# Patient Record
Sex: Female | Born: 1945 | Race: White | Hispanic: No | Smoking: Former smoker
Health system: Southern US, Community
[De-identification: ages and names within clinical notes are randomized; demographics above are authoritative.]

## PROBLEM LIST (undated history)

## (undated) DIAGNOSIS — R0602 Shortness of breath: Secondary | ICD-10-CM

## (undated) DIAGNOSIS — M5126 Other intervertebral disc displacement, lumbar region: Secondary | ICD-10-CM

## (undated) DIAGNOSIS — M4800 Spinal stenosis, site unspecified: Secondary | ICD-10-CM

## (undated) DIAGNOSIS — K219 Gastro-esophageal reflux disease without esophagitis: Secondary | ICD-10-CM

## (undated) DIAGNOSIS — R195 Other fecal abnormalities: Secondary | ICD-10-CM

## (undated) DIAGNOSIS — G709 Myoneural disorder, unspecified: Secondary | ICD-10-CM

## (undated) DIAGNOSIS — I499 Cardiac arrhythmia, unspecified: Secondary | ICD-10-CM

## (undated) DIAGNOSIS — F329 Major depressive disorder, single episode, unspecified: Secondary | ICD-10-CM

## (undated) DIAGNOSIS — I10 Essential (primary) hypertension: Secondary | ICD-10-CM

## (undated) DIAGNOSIS — C801 Malignant (primary) neoplasm, unspecified: Secondary | ICD-10-CM

## (undated) DIAGNOSIS — F32A Depression, unspecified: Secondary | ICD-10-CM

## (undated) DIAGNOSIS — IMO0001 Reserved for inherently not codable concepts without codable children: Secondary | ICD-10-CM

## (undated) DIAGNOSIS — M797 Fibromyalgia: Secondary | ICD-10-CM

## (undated) DIAGNOSIS — F419 Anxiety disorder, unspecified: Secondary | ICD-10-CM

## (undated) HISTORY — PX: EYE SURGERY: SHX253

## (undated) HISTORY — PX: CHOLECYSTECTOMY: SHX55

## (undated) HISTORY — PX: TONSILLECTOMY: SUR1361

## (undated) HISTORY — DX: Gastro-esophageal reflux disease without esophagitis: K21.9

## (undated) HISTORY — PX: APPENDECTOMY: SHX54

## (undated) HISTORY — PX: ABDOMINAL HYSTERECTOMY: SHX81

## (undated) HISTORY — DX: Reserved for inherently not codable concepts without codable children: IMO0001

## (undated) HISTORY — PX: CARPAL TUNNEL RELEASE: SHX101

## (undated) HISTORY — DX: Other fecal abnormalities: R19.5

---

## 2003-10-25 ENCOUNTER — Ambulatory Visit (HOSPITAL_BASED_OUTPATIENT_CLINIC_OR_DEPARTMENT_OTHER): Admission: RE | Admit: 2003-10-25 | Discharge: 2003-10-25 | Payer: Self-pay | Admitting: Orthopedic Surgery

## 2003-10-25 ENCOUNTER — Ambulatory Visit (HOSPITAL_COMMUNITY): Admission: RE | Admit: 2003-10-25 | Discharge: 2003-10-25 | Payer: Self-pay | Admitting: Orthopedic Surgery

## 2004-04-07 ENCOUNTER — Ambulatory Visit (HOSPITAL_COMMUNITY): Admission: RE | Admit: 2004-04-07 | Discharge: 2004-04-07 | Payer: Self-pay | Admitting: Orthopedic Surgery

## 2004-04-07 ENCOUNTER — Ambulatory Visit (HOSPITAL_BASED_OUTPATIENT_CLINIC_OR_DEPARTMENT_OTHER): Admission: RE | Admit: 2004-04-07 | Discharge: 2004-04-07 | Payer: Self-pay | Admitting: Orthopedic Surgery

## 2010-07-13 LAB — HEPATIC FUNCTION PANEL: Bilirubin, Total: 0.2 mg/dL

## 2010-07-13 LAB — BASIC METABOLIC PANEL: Potassium: 4 mmol/L (ref 3.4–5.3)

## 2010-09-09 ENCOUNTER — Ambulatory Visit (INDEPENDENT_AMBULATORY_CARE_PROVIDER_SITE_OTHER): Payer: BC Managed Care – PPO | Admitting: Internal Medicine

## 2010-09-09 DIAGNOSIS — K921 Melena: Secondary | ICD-10-CM

## 2010-09-09 DIAGNOSIS — Z7982 Long term (current) use of aspirin: Secondary | ICD-10-CM

## 2010-09-09 DIAGNOSIS — D649 Anemia, unspecified: Secondary | ICD-10-CM

## 2010-09-09 LAB — CBC AND DIFFERENTIAL
HCT: 81 % — AB (ref 36–46)
Hemoglobin: 10.9 g/dL — AB (ref 12.0–16.0)

## 2010-10-05 NOTE — Consult Note (Signed)
NAME:  Brianna Ray, Brianna Ray NO.:  [id number]  MEDICAL RECORD NO.:  [id number]  LOCATION:                                 FACILITY:  PHYSICIAN:  Hildred Laser, M.D.    DATE OF BIRTH:  16-May-1945  DATE OF CONSULTATION: 09/09/2010 DATE OF DISCHARGE:                                CONSULTATION   REASON FOR CONSULTATION:  Heme-positive stools.  HISTORY OF PRESENT ILLNESS:  Brianna Ray is a 65 year old white female referred to our office by Dr. Nadara Mustard for heme-positive stools.  It was also noted on July 13, 2010, her hemoglobin was slightly low at 10.8, hematocrit was 33.7 and her MCV was 76, platelets 223 which were normal. She does tell me that her stool cards, she could not tell me how many were positive.  She tells me that her hemoglobin last year was around 11.  She says her appetite has been good.  There has been no weight loss.  She does tell me occasionally she will have crampy lower abdominal pain and then immediately have to go to the bathroom.  She will have 2-3 stools a day or more during these episodes which are very small.  The crampy abdominal pain occurs a couple of times a week and then she may go over a week without any problems.  She denies any melena or bright red rectal bleeding.  She usually has 1 bowel movement a day which she describes as chunky and in pieces.  She does tell me this summer her stools are very thin.  She does have formed stools about 3 times a week.  She occasionally has acid reflux if she overeats.  She denies any epigastric pain.  There has been no nausea or vomiting.  No fever.  No dysphagia.  No constipation.  Her last colonoscopy was either 2000 or 2003 by Dr. Lindalou Hose and she reports that it was normal.  She could not remember why she underwent the colonoscopy.   She is allergic to [facility].  MEDICATIONS: 1. Lantus 60 units twice a day. 2. NovoLog 4/15 units sliding scale. 3. Metformin 1000 mg twice a day. 4. Diltiazem 60 mg  2 three times a day. 5. Enalapril 20 mg a day. 6. Lasix 40 mg a day. 7. Metoprolol 50 mg a day. 8. Glyburide 5 mg 2 b.i.d. 9. Hydrocodone/APAP 10/500 one at night. 10.Aspirin 325 mg a day.  SURGERY:  She had a total hysterectomy for fibroids at age 47 in 24. Appendectomy age 6.  Tonsillectomy age 15.  She had a basal cell carcinoma removed from her nose 2 years ago by the Mohs procedure.  MEDICAL HISTORY:  She has had hypertension since age 59.  She has been a diabetic type 2 since 1993.  She has high cholesterol off and on.  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  Her mother is deceased from breast cancer with mets.  Her father is deceased from lung cancer.  One sister with diabetes and seizures.  No brothers.  SOCIAL HISTORY:  She is divorced.  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  She quit smoking in 2005 after smoking 40 years.  She was smoking 1 pack a day and no EtOH.  OBJECTIVE:  VITAL SIGNS:  Her weight is 314, her height is 5 feet 10 inches, her temperature is 98.7, her blood pressure is 172/66, her pulse is 80. HEENT:  She has natural teeth.  Her oral mucosa is moist and no lesions. Her conjunctivae are pink.  Her sclerae are anicteric. NECK:  Her thyroid is normal.  There is no cervical lymphadenopathy. LUNGS:  Clear. HEART:  Regular rate and rhythm. ABDOMEN:  Soft.  Bowel sounds are positive.  She does have a soft firm area to the right upper quadrant.  There is no abdominal pain. EXTREMITIES:  She has 4+ edema to her lower extremities and her stools are brown guaiac negative.  ASSESSMENT:  Brianna Ray is a 65 year old female presenting today with anemia and guaiac-positive stools.  A colonic neoplasm needs to be ruled out. Her last colonoscopy was either in 2000 or 2003 by Dr. Lindalou Hose which she reports is normal.  RECOMMENDATIONS:  We will schedule a colonoscopy in near future.  The risk and benefits were reviewed with the patient and she is agreeable. I will also get iron studies and a CBC on her today.  Thank you for allowing [place] to participate in her care.    ______________________________ Deberah Castle, NP   ______________________________ Hildred Laser, M.D.    TS/MEDQ  D:  09/09/2010  T:  09/10/2010  Job:  [id number]  cc:   Rory Percy, MD Fax: [phone]  Electronically Signed by Deberah Castle PA on 09/14/2010 12:12:17 PM Electronically Signed by Hildred Laser M.D. on 10/05/2010 12:15:31 AM

## 2010-10-21 ENCOUNTER — Encounter (HOSPITAL_COMMUNITY)
Admission: RE | Disposition: A | Discharge status: Home / Self Care | Payer: Self-pay | Source: Ambulatory Visit | Attending: Internal Medicine

## 2010-10-21 ENCOUNTER — Ambulatory Visit (HOSPITAL_COMMUNITY)
Admission: RE | Admit: 2010-10-21 | Discharge: 2010-10-21 | Disposition: A | Discharge status: Home / Self Care | Payer: BC Managed Care – PPO | Source: Ambulatory Visit | Attending: Internal Medicine | Admitting: Internal Medicine

## 2010-10-21 ENCOUNTER — Other Ambulatory Visit (INDEPENDENT_AMBULATORY_CARE_PROVIDER_SITE_OTHER): Payer: Self-pay | Admitting: Internal Medicine

## 2010-10-21 ENCOUNTER — Encounter (INDEPENDENT_AMBULATORY_CARE_PROVIDER_SITE_OTHER): Payer: BC Managed Care – PPO | Admitting: Internal Medicine

## 2010-10-21 ENCOUNTER — Encounter (HOSPITAL_COMMUNITY): Payer: Self-pay | Admitting: *Deleted

## 2010-10-21 DIAGNOSIS — D509 Iron deficiency anemia, unspecified: Secondary | ICD-10-CM | POA: Insufficient documentation

## 2010-10-21 DIAGNOSIS — K921 Melena: Secondary | ICD-10-CM

## 2010-10-21 DIAGNOSIS — K5730 Diverticulosis of large intestine without perforation or abscess without bleeding: Secondary | ICD-10-CM | POA: Insufficient documentation

## 2010-10-21 DIAGNOSIS — Z79899 Other long term (current) drug therapy: Secondary | ICD-10-CM | POA: Insufficient documentation

## 2010-10-21 DIAGNOSIS — K449 Diaphragmatic hernia without obstruction or gangrene: Secondary | ICD-10-CM | POA: Insufficient documentation

## 2010-10-21 DIAGNOSIS — E119 Type 2 diabetes mellitus without complications: Secondary | ICD-10-CM | POA: Insufficient documentation

## 2010-10-21 DIAGNOSIS — K2940 Chronic atrophic gastritis without bleeding: Secondary | ICD-10-CM | POA: Insufficient documentation

## 2010-10-21 DIAGNOSIS — Z7982 Long term (current) use of aspirin: Secondary | ICD-10-CM | POA: Insufficient documentation

## 2010-10-21 DIAGNOSIS — R109 Unspecified abdominal pain: Secondary | ICD-10-CM | POA: Insufficient documentation

## 2010-10-21 DIAGNOSIS — Z01812 Encounter for preprocedural laboratory examination: Secondary | ICD-10-CM | POA: Insufficient documentation

## 2010-10-21 DIAGNOSIS — D126 Benign neoplasm of colon, unspecified: Secondary | ICD-10-CM | POA: Insufficient documentation

## 2010-10-21 DIAGNOSIS — K2961 Other gastritis with bleeding: Secondary | ICD-10-CM

## 2010-10-21 DIAGNOSIS — K644 Residual hemorrhoidal skin tags: Secondary | ICD-10-CM

## 2010-10-21 DIAGNOSIS — R197 Diarrhea, unspecified: Secondary | ICD-10-CM | POA: Insufficient documentation

## 2010-10-21 DIAGNOSIS — I10 Essential (primary) hypertension: Secondary | ICD-10-CM | POA: Insufficient documentation

## 2010-10-21 HISTORY — DX: Myoneural disorder, unspecified: G70.9

## 2010-10-21 HISTORY — PX: COLONOSCOPY: SHX5424

## 2010-10-21 HISTORY — DX: Cardiac arrhythmia, unspecified: I49.9

## 2010-10-21 HISTORY — DX: Major depressive disorder, single episode, unspecified: F32.9

## 2010-10-21 HISTORY — DX: Depression, unspecified: F32.A

## 2010-10-21 HISTORY — PX: ESOPHAGOGASTRODUODENOSCOPY: SHX5428

## 2010-10-21 HISTORY — DX: Malignant (primary) neoplasm, unspecified: C80.1

## 2010-10-21 HISTORY — DX: Anxiety disorder, unspecified: F41.9

## 2010-10-21 HISTORY — PX: COLONOSCOPY: SHX174

## 2010-10-21 HISTORY — DX: Fibromyalgia: M79.7

## 2010-10-21 HISTORY — DX: Essential (primary) hypertension: I10

## 2010-10-21 HISTORY — DX: Shortness of breath: R06.02

## 2010-10-21 LAB — GLUCOSE, CAPILLARY: Glucose-Capillary: 182 mg/dL — ABNORMAL HIGH (ref 70–99)

## 2010-10-21 SURGERY — COLONOSCOPY
Anesthesia: Moderate Sedation

## 2010-10-21 MED ORDER — MEPERIDINE HCL 50 MG/ML IJ SOLN
INTRAMUSCULAR | Status: AC
  Filled 2010-10-21: qty 1

## 2010-10-21 MED ORDER — MIDAZOLAM HCL 5 MG/5ML IJ SOLN
INTRAMUSCULAR | Status: AC
  Filled 2010-10-21: qty 10

## 2010-10-21 MED ORDER — PANTOPRAZOLE SODIUM 40 MG PO TBEC: DELAYED_RELEASE_TABLET | ORAL | Status: AC

## 2010-10-21 MED ORDER — BUTAMBEN-TETRACAINE-BENZOCAINE 2-2-14 % EX AERO
INHALATION_SPRAY | CUTANEOUS | Status: DC | PRN
  Administered 2010-10-21: 2 via TOPICAL

## 2010-10-21 MED ORDER — SODIUM CHLORIDE 0.45 % IV SOLN
Freq: Once | INTRAVENOUS | Status: AC
  Administered 2010-10-21: 12:00:00 via INTRAVENOUS

## 2010-10-21 MED ORDER — MEPERIDINE HCL 50 MG/ML IJ SOLN
INTRAMUSCULAR | Status: DC | PRN
  Administered 2010-10-21: 50 mg via INTRAVENOUS
  Administered 2010-10-21 (×2): 25 mg via INTRAVENOUS

## 2010-10-21 MED ORDER — MIDAZOLAM HCL 5 MG/5ML IJ SOLN
INTRAMUSCULAR | Status: DC | PRN
  Administered 2010-10-21: 3 mg via INTRAVENOUS
  Administered 2010-10-21: 2 mg via INTRAVENOUS
  Administered 2010-10-21: 1 mg via INTRAVENOUS
  Administered 2010-10-21 (×4): 2 mg via INTRAVENOUS
  Administered 2010-10-21: 1 mg via INTRAVENOUS

## 2010-10-21 MED ORDER — MIDAZOLAM HCL 5 MG/5ML IJ SOLN
INTRAMUSCULAR | Status: AC
  Filled 2010-10-21: qty 5

## 2010-10-21 MED ORDER — FERROUS SULFATE 325 (65 FE) MG PO TABS: 325.0000 mg | ORAL_TABLET | Freq: Two times a day (BID) | ORAL | Status: DC

## 2010-10-21 MED ORDER — DICYCLOMINE HCL 10 MG PO CAPS: 10.0000 mg | ORAL_CAPSULE | Freq: Two times a day (BID) | ORAL | Status: AC

## 2010-10-21 NOTE — Op Note (Signed)
NAME:  Brianna Ray, PARTRIDGE NO.:  [id number]  MEDICAL RECORD NO.:  [id number]  LOCATION:  [facility]                          FACILITY:  [facility]  PHYSICIAN:  Hildred Laser, M.D.    DATE OF BIRTH:  1945-10-08  DATE OF PROCEDURE:  10/21/2010 DATE OF DISCHARGE:  10/21/2010                              OPERATIVE REPORT   PROCEDURE:  Colonoscopy followed by esophagogastroduodenoscopy.  INDICATION:  Brianna Ray is 65 year old Caucasian female with multiple medical problems who was found to have anemia and heme-positive stools. She does give history of chronic diarrhea and lower abdominal cramps, however, she has not experienced any melena or rectal bleeding.  She is on full-dose aspirin for antiplatelet function, however denies epigastric pain.  She also denies dysphagia and she experiences heartburn occasionally, easily controlled with OTC antacids.  When patient was seen in our office few weeks ago, she had iron studies and serum iron, and TIBC, serum iron was 35, and saturation was 8%, and ferritin was also low at 8.  She is undergoing diagnostic colonoscopy and less bleeding lesions found.  She will also undergo esophagogastroduodenoscopy.  The patient is agreeable to proceed with EGD if necessary.  The patient is agreeable to also proceed with EGD if necessary.  MEDS FOR CONSCIOUS SEDATION:  Demerol 100 mg IV, Versed 15 mg IV, Cetacaine spray for oropharyngeal topical anesthesia.  FINDINGS:  Procedure #1 colonoscopy.  The patient was placed in left lateral recumbent position and rectal examination performed.  No abnormality noted on external or digital exam.  Pentax videoscope was placed through rectum and advanced under vision into sigmoid colon and beyond.  Preparation was satisfactory.  She had diverticula scattered throughout the colon, somewhat quite large.  Her mucosa was normal. Scope was passed into cecum which was identified by appendiceal orifice and ileocecal valve.   Pictures taken for the record.  As the scope was withdrawn, colonic mucosa was carefully examined.  There was a 4-mm polyp at ascending colon which was ablated via cold biopsy.  Mucosa and rest of colon was normal.  Random biopsies were taken from mucosa of sigmoid colon looking for microscopic colitis.  Rectal mucosa similarly was normal.  Scope was retroflexed to examine anorectal junction and small hemorrhoids noted below the dentate line.  Endoscope was withdrawn.  Withdrawal time was 13 minutes.  The patient was prepared for procedure #2.  Esophagogastroduodenoscopy.  Pentax videoscope was passed via oropharynx without any difficulty into esophagus.  Esophagus.  Mucosa of the esophagus was normal.  Single ulcer was identified at GE junction which was located at 38 cm from the incisors and hiatus at 40.  Stomach.  It was emptied and distended very well by insufflation.  Folds of proximal stomach are normal.  Examination of mucosa revealed multiple antral erosions and most of these were covered with speck of coffee- ground.  These areas were washed and no active bleeding was noted.  No ulcer crater was identified.  Pyloric channel was patent.  Angularis, fundus, and cardia were examined by retroflexion of scope and were normal.  Duodenum.  Bulbar mucosa was normal.  Scope was passed into second part of the duodenum where mucosa  and folds were normal.  Endoscope was withdrawn.  The patient tolerated the procedures well.  FINAL DIAGNOSES: 1. Pancolonic diverticulosis. 2. A 4-mm polyp ablated via cold biopsy from ascending colon. 3. Random biopsy taken from mucosa of sigmoid colon looking for     microscopic colitis. 4. Single ulcer at gastroesophageal junction secondary to     gastroesophageal reflux disease. 5. A 2-cm size sliding hiatal hernia. 6. Erosive antral gastritis with stigmata of bleed.  No intervention     was rendered. 7. It is possible that she has been losing  blood intermittently from     her upper GI tract and/or gastritis is either due to H. pylori or     aspirin induced. 8. Her diarrhea and abdominal cramps, possibly due to irritable bowel     syndrome, but need to rule out microscopic colitis and biopsy     should sort that out.  RECOMMENDATIONS: 1. Antireflux measures. 2. Pantoprazole 40 mg p.o. q.a.m. prescription given for 30 with 5     refills. 3. Dicyclomine 10 mg before breakfast and lunch, prescription given     for 60 with 5 refills. 4. Ferrous sulfate OTC 325 mg after breakfast and evening meal or     twice daily. 5. We will check her H. pylori serology today. 6. Unless she has H. pylori gastritis, I would ask Dr. Nadara Mustard if dose     of aspirin could be reduced.  We will plan to check her hemoglobin in 8 weeks and determine whether or not she will need further workup.          ______________________________ Hildred Laser, M.D.     NR/MEDQ  D:  10/21/2010  T:  10/21/2010  Job:  [id number]  cc:   Rory Percy, MD Fax: [phone]

## 2010-10-21 NOTE — H&P (Signed)
Brianna Ray is an 65 y.o. female.   Chief Complaint:  For colonoscopy and possible EGD. HPI: patient is 65 yo female with multiple problems recently found to have Hgb of 10.8 and heme positive stools. No history of melena, rectal bleeding or weight loss. She has chronic diarrhea and LLQ abdominal pain.  Past Medical History  Diagnosis Date  . Shortness of breath   . Cancer   . Hypertension   . Anxiety   . Dysrhythmia   . Diabetes mellitus   . Neuromuscular disorder   . Fibromyalgia   . Depression     Past Surgical History  Procedure Date  . Colon surgery   . Appendectomy   . Abdominal hysterectomy   . Cholecystectomy   . Eye surgery     History reviewed. No pertinent family history. Social History:  reports that she has quit smoking. She does not have any smokeless tobacco history on file. She reports that she does not drink alcohol or use illicit drugs.  Allergies:  Allergies  Allergen Reactions  . Macrodantin (Nitrofurantoin)     Medications Prior to Admission  Medication Dose Route Frequency Provider Last Rate Last Dose  . meperidine (DEMEROL) 50 MG/ML injection           . midazolam (VERSED) 5 MG/5ML injection           . 0.45 % sodium chloride infusion   Intravenous Once Rogene Houston, MD 20 mL/hr at 10/21/10 1202     Medications Prior to Admission  Medication Sig Dispense Refill  . aspirin 325 MG tablet Take 325 mg by mouth once.        . diltiazem (CARDIZEM) 120 MG tablet Take 120 mg by mouth 3 (three) times daily.        . enalapril (VASOTEC) 10 MG tablet Take 5 mg by mouth.        . glyBURIDE (DIABETA) 5 MG tablet 5 mg 2 (two) times daily with a meal.        . insulin glargine (LANTUS) 100 UNIT/ML injection 60 Units 2 (two) times daily.        . insulin lispro (HUMALOG) 100 UNIT/ML injection Inject 5-11 Units into the skin 3 (three) times daily before meals.        . metFORMIN (GLUCOPHAGE) 1000 MG tablet Take 1,000 mg by mouth.          Results for  orders placed during the hospital encounter of 10/21/10 (from the past 48 hour(s))  GLUCOSE, CAPILLARY     Status: Abnormal   Collection Time   10/21/10 11:26 AM      Component Value Range Comment   Glucose-Capillary 182 (*) 70 - 99 (mg/dL)    No results found.  Review of Systems  Constitutional: Negative.  Negative for fever and chills.  Gastrointestinal: Positive for heartburn (occassional) and abdominal pain (at [facility]). Negative for nausea, vomiting, blood in stool and melena. Diarrhea: average frequency 3 to 4 times a day.    Blood pressure 176/99, pulse 107, temperature 98.3 F (36.8 C), temperature source Oral, resp. rate 16, height 6' (1.829 m), weight 136.079 kg (300 lb), SpO2 98.00%. Physical Exam  Constitutional: She appears well-developed.       Moderately obese   HENT:  Head: Normocephalic.  Eyes: Conjunctivae are normal. No scleral icterus.  Neck: Neck supple. No thyromegaly present.  Cardiovascular: Normal rate and regular rhythm.   No murmur heard. Respiratory: Breath sounds normal.  GI: Normal appearance.  She exhibits no shifting dullness, no abdominal bruit and no mass. There is no hepatosplenomegaly. There is tenderness in the left lower quadrant. There is no rebound.  Musculoskeletal: She exhibits edema (2+).  Lymphadenopathy:    She has no cervical adenopathy.  Neurological: She is alert.  Skin: Skin is warm and dry.     Assessment/Plan Anemia with heme positive stools. Colonoscopy and possible EGD.  Murna Backer U 10/21/2010, 12:33 PM

## 2010-10-26 ENCOUNTER — Telehealth (INDEPENDENT_AMBULATORY_CARE_PROVIDER_SITE_OTHER): Payer: Self-pay | Admitting: *Deleted

## 2010-10-26 NOTE — Telephone Encounter (Signed)
Message copied by Grayland Ormond on Mon Oct 26, 2010  9:45 AM ------      Message from: Rogene Houston      Created: Mon Oct 26, 2010  9:17 AM       Please call pt; test negative;       Copy to PCP.

## 2010-10-27 ENCOUNTER — Telehealth (INDEPENDENT_AMBULATORY_CARE_PROVIDER_SITE_OTHER): Payer: Self-pay | Admitting: *Deleted

## 2010-10-27 DIAGNOSIS — D649 Anemia, unspecified: Secondary | ICD-10-CM

## 2010-10-27 NOTE — Telephone Encounter (Signed)
Patient called and advised not to take dicyclomine anymore. She should take fiber supplement 3-4 g daily. She has stopped taking aspirin after consultation with Dr. Nadara Mustard.. We will remind her to go to the lab in 8 weeks for hemoglobin and hematocrit.

## 2010-10-28 ENCOUNTER — Encounter (INDEPENDENT_AMBULATORY_CARE_PROVIDER_SITE_OTHER): Payer: Self-pay | Admitting: *Deleted

## 2010-10-28 ENCOUNTER — Encounter (HOSPITAL_COMMUNITY): Payer: Self-pay | Admitting: Internal Medicine

## 2010-10-28 NOTE — Telephone Encounter (Signed)
Sent remind me message to release lab order 09-12. I will call patient to notify of upcoming lab.

## 2010-10-28 NOTE — Telephone Encounter (Signed)
This encounter was created in error - please disregard.

## 2010-11-30 ENCOUNTER — Encounter (INDEPENDENT_AMBULATORY_CARE_PROVIDER_SITE_OTHER): Payer: Self-pay

## 2010-12-09 ENCOUNTER — Encounter (INDEPENDENT_AMBULATORY_CARE_PROVIDER_SITE_OTHER): Payer: Self-pay | Admitting: *Deleted

## 2010-12-09 NOTE — Telephone Encounter (Signed)
This encounter was created in error - please disregard.

## 2010-12-15 ENCOUNTER — Other Ambulatory Visit (INDEPENDENT_AMBULATORY_CARE_PROVIDER_SITE_OTHER): Payer: Self-pay | Admitting: Internal Medicine

## 2010-12-16 LAB — HEMOGLOBIN AND HEMATOCRIT, BLOOD
HCT: 37.6 % (ref 36.0–46.0)
Hemoglobin: 11.3 g/dL — ABNORMAL LOW (ref 12.0–15.0)

## 2010-12-23 ENCOUNTER — Telehealth (INDEPENDENT_AMBULATORY_CARE_PROVIDER_SITE_OTHER): Payer: Self-pay | Admitting: *Deleted

## 2010-12-23 DIAGNOSIS — D649 Anemia, unspecified: Secondary | ICD-10-CM

## 2010-12-23 NOTE — Telephone Encounter (Signed)
Per Dr. Laural Golden the patient will need a H/H, Ferritin in 8 weeks . This will be around 02-19-11. The order will be faxed to [facility] and a letter will be sent to the patient.

## 2011-02-10 ENCOUNTER — Encounter (INDEPENDENT_AMBULATORY_CARE_PROVIDER_SITE_OTHER): Payer: Self-pay | Admitting: *Deleted

## 2011-02-19 ENCOUNTER — Other Ambulatory Visit (INDEPENDENT_AMBULATORY_CARE_PROVIDER_SITE_OTHER): Payer: Self-pay | Admitting: Internal Medicine

## 2011-02-20 LAB — FERRITIN: Ferritin: 12 ng/mL (ref 10–291)

## 2011-03-03 ENCOUNTER — Telehealth (INDEPENDENT_AMBULATORY_CARE_PROVIDER_SITE_OTHER): Payer: Self-pay | Admitting: *Deleted

## 2011-03-03 DIAGNOSIS — D649 Anemia, unspecified: Secondary | ICD-10-CM

## 2011-03-03 NOTE — Telephone Encounter (Signed)
Per Dr. Laural Golden this will be due in 2 months.

## 2011-04-01 ENCOUNTER — Encounter (INDEPENDENT_AMBULATORY_CARE_PROVIDER_SITE_OTHER): Payer: Self-pay | Admitting: *Deleted

## 2011-04-12 ENCOUNTER — Encounter (INDEPENDENT_AMBULATORY_CARE_PROVIDER_SITE_OTHER): Payer: BC Managed Care – PPO | Admitting: Ophthalmology

## 2011-04-12 DIAGNOSIS — E11359 Type 2 diabetes mellitus with proliferative diabetic retinopathy without macular edema: Secondary | ICD-10-CM

## 2011-04-12 DIAGNOSIS — H2510 Age-related nuclear cataract, unspecified eye: Secondary | ICD-10-CM

## 2011-04-12 DIAGNOSIS — H43819 Vitreous degeneration, unspecified eye: Secondary | ICD-10-CM

## 2011-04-12 DIAGNOSIS — H4310 Vitreous hemorrhage, unspecified eye: Secondary | ICD-10-CM

## 2011-04-12 DIAGNOSIS — E1139 Type 2 diabetes mellitus with other diabetic ophthalmic complication: Secondary | ICD-10-CM

## 2011-04-12 DIAGNOSIS — H3581 Retinal edema: Secondary | ICD-10-CM

## 2011-04-22 ENCOUNTER — Other Ambulatory Visit (INDEPENDENT_AMBULATORY_CARE_PROVIDER_SITE_OTHER): Payer: BC Managed Care – PPO | Admitting: Ophthalmology

## 2011-04-22 DIAGNOSIS — H3581 Retinal edema: Secondary | ICD-10-CM

## 2011-08-20 ENCOUNTER — Ambulatory Visit (INDEPENDENT_AMBULATORY_CARE_PROVIDER_SITE_OTHER): Payer: Medicare Other | Admitting: Ophthalmology

## 2011-08-20 DIAGNOSIS — H2510 Age-related nuclear cataract, unspecified eye: Secondary | ICD-10-CM

## 2011-08-20 DIAGNOSIS — E11359 Type 2 diabetes mellitus with proliferative diabetic retinopathy without macular edema: Secondary | ICD-10-CM

## 2011-08-20 DIAGNOSIS — H43819 Vitreous degeneration, unspecified eye: Secondary | ICD-10-CM

## 2011-08-20 DIAGNOSIS — E1139 Type 2 diabetes mellitus with other diabetic ophthalmic complication: Secondary | ICD-10-CM

## 2012-02-21 ENCOUNTER — Ambulatory Visit (INDEPENDENT_AMBULATORY_CARE_PROVIDER_SITE_OTHER): Payer: Medicare Other | Admitting: Ophthalmology

## 2012-03-07 ENCOUNTER — Encounter (INDEPENDENT_AMBULATORY_CARE_PROVIDER_SITE_OTHER): Payer: Medicare Other | Admitting: Ophthalmology

## 2012-03-07 DIAGNOSIS — E11359 Type 2 diabetes mellitus with proliferative diabetic retinopathy without macular edema: Secondary | ICD-10-CM

## 2012-03-07 DIAGNOSIS — E1165 Type 2 diabetes mellitus with hyperglycemia: Secondary | ICD-10-CM

## 2012-03-07 DIAGNOSIS — H43819 Vitreous degeneration, unspecified eye: Secondary | ICD-10-CM

## 2012-03-07 DIAGNOSIS — H4310 Vitreous hemorrhage, unspecified eye: Secondary | ICD-10-CM

## 2012-04-10 ENCOUNTER — Ambulatory Visit (INDEPENDENT_AMBULATORY_CARE_PROVIDER_SITE_OTHER): Payer: Medicare Other | Admitting: Ophthalmology

## 2012-07-07 ENCOUNTER — Ambulatory Visit (INDEPENDENT_AMBULATORY_CARE_PROVIDER_SITE_OTHER): Payer: Medicare Other | Admitting: Ophthalmology

## 2012-07-07 DIAGNOSIS — E11359 Type 2 diabetes mellitus with proliferative diabetic retinopathy without macular edema: Secondary | ICD-10-CM

## 2012-07-07 DIAGNOSIS — H35039 Hypertensive retinopathy, unspecified eye: Secondary | ICD-10-CM

## 2012-07-07 DIAGNOSIS — H4310 Vitreous hemorrhage, unspecified eye: Secondary | ICD-10-CM

## 2012-07-07 DIAGNOSIS — E1139 Type 2 diabetes mellitus with other diabetic ophthalmic complication: Secondary | ICD-10-CM

## 2012-07-07 DIAGNOSIS — I10 Essential (primary) hypertension: Secondary | ICD-10-CM

## 2012-07-07 DIAGNOSIS — H43819 Vitreous degeneration, unspecified eye: Secondary | ICD-10-CM

## 2013-01-08 ENCOUNTER — Ambulatory Visit (INDEPENDENT_AMBULATORY_CARE_PROVIDER_SITE_OTHER): Payer: Medicare Other | Admitting: Ophthalmology

## 2013-01-08 DIAGNOSIS — E1139 Type 2 diabetes mellitus with other diabetic ophthalmic complication: Secondary | ICD-10-CM

## 2013-01-08 DIAGNOSIS — H43819 Vitreous degeneration, unspecified eye: Secondary | ICD-10-CM

## 2013-01-08 DIAGNOSIS — H35039 Hypertensive retinopathy, unspecified eye: Secondary | ICD-10-CM

## 2013-01-08 DIAGNOSIS — I10 Essential (primary) hypertension: Secondary | ICD-10-CM

## 2013-01-08 DIAGNOSIS — E11359 Type 2 diabetes mellitus with proliferative diabetic retinopathy without macular edema: Secondary | ICD-10-CM

## 2013-05-21 DIAGNOSIS — E119 Type 2 diabetes mellitus without complications: Secondary | ICD-10-CM | POA: Diagnosis not present

## 2013-05-21 DIAGNOSIS — E1149 Type 2 diabetes mellitus with other diabetic neurological complication: Secondary | ICD-10-CM | POA: Diagnosis not present

## 2013-06-18 DIAGNOSIS — E669 Obesity, unspecified: Secondary | ICD-10-CM | POA: Diagnosis not present

## 2013-06-18 DIAGNOSIS — M129 Arthropathy, unspecified: Secondary | ICD-10-CM | POA: Diagnosis not present

## 2013-06-18 DIAGNOSIS — M5430 Sciatica, unspecified side: Secondary | ICD-10-CM | POA: Diagnosis not present

## 2013-06-18 DIAGNOSIS — I10 Essential (primary) hypertension: Secondary | ICD-10-CM | POA: Diagnosis not present

## 2013-06-18 DIAGNOSIS — IMO0001 Reserved for inherently not codable concepts without codable children: Secondary | ICD-10-CM | POA: Diagnosis not present

## 2013-06-18 DIAGNOSIS — G619 Inflammatory polyneuropathy, unspecified: Secondary | ICD-10-CM | POA: Diagnosis not present

## 2013-07-03 DIAGNOSIS — M48061 Spinal stenosis, lumbar region without neurogenic claudication: Secondary | ICD-10-CM | POA: Diagnosis not present

## 2013-07-03 DIAGNOSIS — M5126 Other intervertebral disc displacement, lumbar region: Secondary | ICD-10-CM | POA: Diagnosis not present

## 2013-07-16 ENCOUNTER — Ambulatory Visit (INDEPENDENT_AMBULATORY_CARE_PROVIDER_SITE_OTHER): Payer: Medicare Other | Admitting: Ophthalmology

## 2013-07-27 DIAGNOSIS — R110 Nausea: Secondary | ICD-10-CM | POA: Diagnosis not present

## 2013-07-27 DIAGNOSIS — R413 Other amnesia: Secondary | ICD-10-CM | POA: Diagnosis not present

## 2013-07-27 DIAGNOSIS — J309 Allergic rhinitis, unspecified: Secondary | ICD-10-CM | POA: Diagnosis not present

## 2013-07-27 DIAGNOSIS — R51 Headache: Secondary | ICD-10-CM | POA: Diagnosis not present

## 2013-07-28 DIAGNOSIS — R300 Dysuria: Secondary | ICD-10-CM | POA: Diagnosis not present

## 2013-07-28 DIAGNOSIS — R413 Other amnesia: Secondary | ICD-10-CM | POA: Diagnosis not present

## 2013-08-02 ENCOUNTER — Ambulatory Visit (INDEPENDENT_AMBULATORY_CARE_PROVIDER_SITE_OTHER): Payer: Medicare Other | Admitting: Ophthalmology

## 2013-08-02 DIAGNOSIS — H35039 Hypertensive retinopathy, unspecified eye: Secondary | ICD-10-CM | POA: Diagnosis not present

## 2013-08-02 DIAGNOSIS — I10 Essential (primary) hypertension: Secondary | ICD-10-CM

## 2013-08-02 DIAGNOSIS — E1139 Type 2 diabetes mellitus with other diabetic ophthalmic complication: Secondary | ICD-10-CM

## 2013-08-02 DIAGNOSIS — E11359 Type 2 diabetes mellitus with proliferative diabetic retinopathy without macular edema: Secondary | ICD-10-CM

## 2013-08-02 DIAGNOSIS — H3530 Unspecified macular degeneration: Secondary | ICD-10-CM

## 2013-08-02 DIAGNOSIS — E1165 Type 2 diabetes mellitus with hyperglycemia: Secondary | ICD-10-CM

## 2013-08-02 DIAGNOSIS — H43819 Vitreous degeneration, unspecified eye: Secondary | ICD-10-CM

## 2013-08-02 DIAGNOSIS — H5340 Unspecified visual field defects: Secondary | ICD-10-CM

## 2013-08-03 DIAGNOSIS — Z1231 Encounter for screening mammogram for malignant neoplasm of breast: Secondary | ICD-10-CM | POA: Diagnosis not present

## 2013-08-06 DIAGNOSIS — E1149 Type 2 diabetes mellitus with other diabetic neurological complication: Secondary | ICD-10-CM | POA: Diagnosis not present

## 2013-08-06 DIAGNOSIS — E119 Type 2 diabetes mellitus without complications: Secondary | ICD-10-CM | POA: Diagnosis not present

## 2013-08-07 DIAGNOSIS — R413 Other amnesia: Secondary | ICD-10-CM | POA: Diagnosis not present

## 2013-08-07 DIAGNOSIS — R0989 Other specified symptoms and signs involving the circulatory and respiratory systems: Secondary | ICD-10-CM | POA: Diagnosis not present

## 2013-08-16 DIAGNOSIS — Z8673 Personal history of transient ischemic attack (TIA), and cerebral infarction without residual deficits: Secondary | ICD-10-CM | POA: Diagnosis not present

## 2013-08-16 DIAGNOSIS — R413 Other amnesia: Secondary | ICD-10-CM | POA: Diagnosis not present

## 2013-08-17 DIAGNOSIS — R300 Dysuria: Secondary | ICD-10-CM | POA: Diagnosis not present

## 2013-08-22 DIAGNOSIS — I6350 Cerebral infarction due to unspecified occlusion or stenosis of unspecified cerebral artery: Secondary | ICD-10-CM | POA: Diagnosis not present

## 2013-08-22 DIAGNOSIS — I10 Essential (primary) hypertension: Secondary | ICD-10-CM | POA: Diagnosis not present

## 2013-08-22 DIAGNOSIS — T169XXA Foreign body in ear, unspecified ear, initial encounter: Secondary | ICD-10-CM | POA: Diagnosis not present

## 2013-09-12 DIAGNOSIS — IMO0001 Reserved for inherently not codable concepts without codable children: Secondary | ICD-10-CM | POA: Diagnosis not present

## 2013-09-12 DIAGNOSIS — E782 Mixed hyperlipidemia: Secondary | ICD-10-CM | POA: Diagnosis not present

## 2013-09-12 DIAGNOSIS — I10 Essential (primary) hypertension: Secondary | ICD-10-CM | POA: Diagnosis not present

## 2013-09-18 DIAGNOSIS — R413 Other amnesia: Secondary | ICD-10-CM | POA: Diagnosis not present

## 2013-09-18 DIAGNOSIS — IMO0001 Reserved for inherently not codable concepts without codable children: Secondary | ICD-10-CM | POA: Diagnosis not present

## 2013-09-18 DIAGNOSIS — I10 Essential (primary) hypertension: Secondary | ICD-10-CM | POA: Diagnosis not present

## 2013-09-18 DIAGNOSIS — E669 Obesity, unspecified: Secondary | ICD-10-CM | POA: Diagnosis not present

## 2013-09-18 DIAGNOSIS — M5430 Sciatica, unspecified side: Secondary | ICD-10-CM | POA: Diagnosis not present

## 2013-09-18 DIAGNOSIS — I6350 Cerebral infarction due to unspecified occlusion or stenosis of unspecified cerebral artery: Secondary | ICD-10-CM | POA: Diagnosis not present

## 2013-09-18 DIAGNOSIS — G619 Inflammatory polyneuropathy, unspecified: Secondary | ICD-10-CM | POA: Diagnosis not present

## 2013-10-17 DIAGNOSIS — E119 Type 2 diabetes mellitus without complications: Secondary | ICD-10-CM | POA: Diagnosis not present

## 2013-10-17 DIAGNOSIS — E1149 Type 2 diabetes mellitus with other diabetic neurological complication: Secondary | ICD-10-CM | POA: Diagnosis not present

## 2013-12-20 DIAGNOSIS — R413 Other amnesia: Secondary | ICD-10-CM | POA: Diagnosis not present

## 2013-12-20 DIAGNOSIS — I10 Essential (primary) hypertension: Secondary | ICD-10-CM | POA: Diagnosis not present

## 2013-12-20 DIAGNOSIS — G619 Inflammatory polyneuropathy, unspecified: Secondary | ICD-10-CM | POA: Diagnosis not present

## 2013-12-20 DIAGNOSIS — IMO0001 Reserved for inherently not codable concepts without codable children: Secondary | ICD-10-CM | POA: Diagnosis not present

## 2013-12-20 DIAGNOSIS — G622 Polyneuropathy due to other toxic agents: Secondary | ICD-10-CM | POA: Diagnosis not present

## 2013-12-20 DIAGNOSIS — E669 Obesity, unspecified: Secondary | ICD-10-CM | POA: Diagnosis not present

## 2013-12-20 DIAGNOSIS — I6350 Cerebral infarction due to unspecified occlusion or stenosis of unspecified cerebral artery: Secondary | ICD-10-CM | POA: Diagnosis not present

## 2013-12-20 DIAGNOSIS — M5430 Sciatica, unspecified side: Secondary | ICD-10-CM | POA: Diagnosis not present

## 2013-12-20 DIAGNOSIS — Z23 Encounter for immunization: Secondary | ICD-10-CM | POA: Diagnosis not present

## 2014-01-03 DIAGNOSIS — B351 Tinea unguium: Secondary | ICD-10-CM | POA: Diagnosis not present

## 2014-01-03 DIAGNOSIS — E1140 Type 2 diabetes mellitus with diabetic neuropathy, unspecified: Secondary | ICD-10-CM | POA: Diagnosis not present

## 2014-02-04 ENCOUNTER — Ambulatory Visit (INDEPENDENT_AMBULATORY_CARE_PROVIDER_SITE_OTHER): Payer: Medicare Other | Admitting: Ophthalmology

## 2014-02-04 DIAGNOSIS — I10 Essential (primary) hypertension: Secondary | ICD-10-CM | POA: Diagnosis not present

## 2014-02-04 DIAGNOSIS — E11311 Type 2 diabetes mellitus with unspecified diabetic retinopathy with macular edema: Secondary | ICD-10-CM | POA: Diagnosis not present

## 2014-02-04 DIAGNOSIS — H35033 Hypertensive retinopathy, bilateral: Secondary | ICD-10-CM | POA: Diagnosis not present

## 2014-02-04 DIAGNOSIS — E11359 Type 2 diabetes mellitus with proliferative diabetic retinopathy without macular edema: Secondary | ICD-10-CM

## 2014-02-04 DIAGNOSIS — H43813 Vitreous degeneration, bilateral: Secondary | ICD-10-CM | POA: Diagnosis not present

## 2014-02-04 DIAGNOSIS — E11351 Type 2 diabetes mellitus with proliferative diabetic retinopathy with macular edema: Secondary | ICD-10-CM | POA: Diagnosis not present

## 2014-03-07 DIAGNOSIS — M5430 Sciatica, unspecified side: Secondary | ICD-10-CM | POA: Diagnosis not present

## 2014-03-07 DIAGNOSIS — I10 Essential (primary) hypertension: Secondary | ICD-10-CM | POA: Diagnosis not present

## 2014-03-07 DIAGNOSIS — E6609 Other obesity due to excess calories: Secondary | ICD-10-CM | POA: Diagnosis not present

## 2014-03-07 DIAGNOSIS — E1165 Type 2 diabetes mellitus with hyperglycemia: Secondary | ICD-10-CM | POA: Diagnosis not present

## 2014-03-07 DIAGNOSIS — M545 Low back pain: Secondary | ICD-10-CM | POA: Diagnosis not present

## 2014-03-07 DIAGNOSIS — G6289 Other specified polyneuropathies: Secondary | ICD-10-CM | POA: Diagnosis not present

## 2014-03-07 DIAGNOSIS — M129 Arthropathy, unspecified: Secondary | ICD-10-CM | POA: Diagnosis not present

## 2014-03-07 DIAGNOSIS — Z1389 Encounter for screening for other disorder: Secondary | ICD-10-CM | POA: Diagnosis not present

## 2014-03-14 DIAGNOSIS — E1140 Type 2 diabetes mellitus with diabetic neuropathy, unspecified: Secondary | ICD-10-CM | POA: Diagnosis not present

## 2014-03-14 DIAGNOSIS — B351 Tinea unguium: Secondary | ICD-10-CM | POA: Diagnosis not present

## 2014-04-11 DIAGNOSIS — M4806 Spinal stenosis, lumbar region: Secondary | ICD-10-CM | POA: Diagnosis not present

## 2014-04-11 DIAGNOSIS — M47816 Spondylosis without myelopathy or radiculopathy, lumbar region: Secondary | ICD-10-CM | POA: Diagnosis not present

## 2014-04-11 DIAGNOSIS — M5136 Other intervertebral disc degeneration, lumbar region: Secondary | ICD-10-CM | POA: Diagnosis not present

## 2014-04-11 DIAGNOSIS — M545 Low back pain: Secondary | ICD-10-CM | POA: Diagnosis not present

## 2014-05-02 DIAGNOSIS — Z87891 Personal history of nicotine dependence: Secondary | ICD-10-CM | POA: Diagnosis not present

## 2014-05-02 DIAGNOSIS — Z7982 Long term (current) use of aspirin: Secondary | ICD-10-CM | POA: Diagnosis not present

## 2014-05-02 DIAGNOSIS — M1990 Unspecified osteoarthritis, unspecified site: Secondary | ICD-10-CM | POA: Diagnosis not present

## 2014-05-02 DIAGNOSIS — Z881 Allergy status to other antibiotic agents status: Secondary | ICD-10-CM | POA: Diagnosis not present

## 2014-05-02 DIAGNOSIS — Z794 Long term (current) use of insulin: Secondary | ICD-10-CM | POA: Diagnosis not present

## 2014-05-02 DIAGNOSIS — Z79899 Other long term (current) drug therapy: Secondary | ICD-10-CM | POA: Diagnosis not present

## 2014-05-02 DIAGNOSIS — E119 Type 2 diabetes mellitus without complications: Secondary | ICD-10-CM | POA: Diagnosis not present

## 2014-05-02 DIAGNOSIS — M4806 Spinal stenosis, lumbar region: Secondary | ICD-10-CM | POA: Diagnosis not present

## 2014-05-02 DIAGNOSIS — G8929 Other chronic pain: Secondary | ICD-10-CM | POA: Diagnosis not present

## 2014-05-02 DIAGNOSIS — Z8673 Personal history of transient ischemic attack (TIA), and cerebral infarction without residual deficits: Secondary | ICD-10-CM | POA: Diagnosis not present

## 2014-05-02 DIAGNOSIS — E0840 Diabetes mellitus due to underlying condition with diabetic neuropathy, unspecified: Secondary | ICD-10-CM | POA: Diagnosis not present

## 2014-05-02 DIAGNOSIS — I10 Essential (primary) hypertension: Secondary | ICD-10-CM | POA: Diagnosis not present

## 2014-05-02 DIAGNOSIS — M5136 Other intervertebral disc degeneration, lumbar region: Secondary | ICD-10-CM | POA: Diagnosis not present

## 2014-05-02 DIAGNOSIS — M47816 Spondylosis without myelopathy or radiculopathy, lumbar region: Secondary | ICD-10-CM | POA: Diagnosis not present

## 2014-05-13 DIAGNOSIS — S90425A Blister (nonthermal), left lesser toe(s), initial encounter: Secondary | ICD-10-CM | POA: Diagnosis not present

## 2014-05-13 DIAGNOSIS — E1165 Type 2 diabetes mellitus with hyperglycemia: Secondary | ICD-10-CM | POA: Diagnosis not present

## 2014-05-13 DIAGNOSIS — G6289 Other specified polyneuropathies: Secondary | ICD-10-CM | POA: Diagnosis not present

## 2014-05-13 DIAGNOSIS — L988 Other specified disorders of the skin and subcutaneous tissue: Secondary | ICD-10-CM | POA: Diagnosis not present

## 2014-05-13 DIAGNOSIS — E782 Mixed hyperlipidemia: Secondary | ICD-10-CM | POA: Diagnosis not present

## 2014-05-14 DIAGNOSIS — Z87891 Personal history of nicotine dependence: Secondary | ICD-10-CM | POA: Diagnosis not present

## 2014-05-14 DIAGNOSIS — L03116 Cellulitis of left lower limb: Secondary | ICD-10-CM | POA: Diagnosis not present

## 2014-05-14 DIAGNOSIS — Z79899 Other long term (current) drug therapy: Secondary | ICD-10-CM | POA: Diagnosis not present

## 2014-05-14 DIAGNOSIS — Z794 Long term (current) use of insulin: Secondary | ICD-10-CM | POA: Diagnosis not present

## 2014-05-14 DIAGNOSIS — M7989 Other specified soft tissue disorders: Secondary | ICD-10-CM | POA: Diagnosis not present

## 2014-05-14 DIAGNOSIS — E1140 Type 2 diabetes mellitus with diabetic neuropathy, unspecified: Secondary | ICD-10-CM | POA: Diagnosis not present

## 2014-05-15 DIAGNOSIS — Z90710 Acquired absence of both cervix and uterus: Secondary | ICD-10-CM | POA: Diagnosis not present

## 2014-05-15 DIAGNOSIS — L03116 Cellulitis of left lower limb: Secondary | ICD-10-CM | POA: Diagnosis not present

## 2014-05-15 DIAGNOSIS — E1140 Type 2 diabetes mellitus with diabetic neuropathy, unspecified: Secondary | ICD-10-CM | POA: Diagnosis not present

## 2014-05-15 DIAGNOSIS — Z79899 Other long term (current) drug therapy: Secondary | ICD-10-CM | POA: Diagnosis not present

## 2014-05-16 DIAGNOSIS — E1140 Type 2 diabetes mellitus with diabetic neuropathy, unspecified: Secondary | ICD-10-CM | POA: Diagnosis not present

## 2014-05-16 DIAGNOSIS — M7989 Other specified soft tissue disorders: Secondary | ICD-10-CM | POA: Diagnosis not present

## 2014-05-16 DIAGNOSIS — S90822D Blister (nonthermal), left foot, subsequent encounter: Secondary | ICD-10-CM | POA: Diagnosis not present

## 2014-05-16 DIAGNOSIS — Z79899 Other long term (current) drug therapy: Secondary | ICD-10-CM | POA: Diagnosis not present

## 2014-05-16 DIAGNOSIS — S90822A Blister (nonthermal), left foot, initial encounter: Secondary | ICD-10-CM | POA: Diagnosis not present

## 2014-05-19 DIAGNOSIS — Z4889 Encounter for other specified surgical aftercare: Secondary | ICD-10-CM | POA: Diagnosis not present

## 2014-05-19 DIAGNOSIS — Z4800 Encounter for change or removal of nonsurgical wound dressing: Secondary | ICD-10-CM | POA: Diagnosis not present

## 2014-05-19 DIAGNOSIS — E119 Type 2 diabetes mellitus without complications: Secondary | ICD-10-CM | POA: Diagnosis not present

## 2014-05-19 DIAGNOSIS — Z87891 Personal history of nicotine dependence: Secondary | ICD-10-CM | POA: Diagnosis not present

## 2014-05-19 DIAGNOSIS — S90422A Blister (nonthermal), left great toe, initial encounter: Secondary | ICD-10-CM | POA: Diagnosis not present

## 2014-05-19 DIAGNOSIS — Z79899 Other long term (current) drug therapy: Secondary | ICD-10-CM | POA: Diagnosis not present

## 2014-05-19 DIAGNOSIS — S90425D Blister (nonthermal), left lesser toe(s), subsequent encounter: Secondary | ICD-10-CM | POA: Diagnosis not present

## 2014-05-23 DIAGNOSIS — Z833 Family history of diabetes mellitus: Secondary | ICD-10-CM | POA: Diagnosis not present

## 2014-05-23 DIAGNOSIS — Z79891 Long term (current) use of opiate analgesic: Secondary | ICD-10-CM | POA: Diagnosis not present

## 2014-05-23 DIAGNOSIS — S90424A Blister (nonthermal), right lesser toe(s), initial encounter: Secondary | ICD-10-CM | POA: Diagnosis not present

## 2014-05-23 DIAGNOSIS — Z888 Allergy status to other drugs, medicaments and biological substances status: Secondary | ICD-10-CM | POA: Diagnosis not present

## 2014-05-23 DIAGNOSIS — S90425A Blister (nonthermal), left lesser toe(s), initial encounter: Secondary | ICD-10-CM | POA: Diagnosis not present

## 2014-05-23 DIAGNOSIS — E119 Type 2 diabetes mellitus without complications: Secondary | ICD-10-CM | POA: Diagnosis not present

## 2014-05-23 DIAGNOSIS — E11621 Type 2 diabetes mellitus with foot ulcer: Secondary | ICD-10-CM | POA: Diagnosis not present

## 2014-05-23 DIAGNOSIS — Z7982 Long term (current) use of aspirin: Secondary | ICD-10-CM | POA: Diagnosis not present

## 2014-05-23 DIAGNOSIS — S90422A Blister (nonthermal), left great toe, initial encounter: Secondary | ICD-10-CM | POA: Diagnosis not present

## 2014-05-23 DIAGNOSIS — Z79899 Other long term (current) drug therapy: Secondary | ICD-10-CM | POA: Diagnosis not present

## 2014-05-23 DIAGNOSIS — Z794 Long term (current) use of insulin: Secondary | ICD-10-CM | POA: Diagnosis not present

## 2014-05-23 DIAGNOSIS — L97511 Non-pressure chronic ulcer of other part of right foot limited to breakdown of skin: Secondary | ICD-10-CM | POA: Diagnosis not present

## 2014-05-23 DIAGNOSIS — Z87891 Personal history of nicotine dependence: Secondary | ICD-10-CM | POA: Diagnosis not present

## 2014-05-23 DIAGNOSIS — L97521 Non-pressure chronic ulcer of other part of left foot limited to breakdown of skin: Secondary | ICD-10-CM | POA: Diagnosis not present

## 2014-05-27 DIAGNOSIS — S90822A Blister (nonthermal), left foot, initial encounter: Secondary | ICD-10-CM | POA: Diagnosis not present

## 2014-05-27 DIAGNOSIS — E119 Type 2 diabetes mellitus without complications: Secondary | ICD-10-CM | POA: Diagnosis not present

## 2014-05-27 DIAGNOSIS — S90424A Blister (nonthermal), right lesser toe(s), initial encounter: Secondary | ICD-10-CM | POA: Diagnosis not present

## 2014-05-27 DIAGNOSIS — S90425A Blister (nonthermal), left lesser toe(s), initial encounter: Secondary | ICD-10-CM | POA: Diagnosis not present

## 2014-05-27 DIAGNOSIS — S90821A Blister (nonthermal), right foot, initial encounter: Secondary | ICD-10-CM | POA: Diagnosis not present

## 2014-05-27 DIAGNOSIS — R938 Abnormal findings on diagnostic imaging of other specified body structures: Secondary | ICD-10-CM | POA: Diagnosis not present

## 2014-05-28 DIAGNOSIS — Z79891 Long term (current) use of opiate analgesic: Secondary | ICD-10-CM | POA: Diagnosis not present

## 2014-05-28 DIAGNOSIS — Z87891 Personal history of nicotine dependence: Secondary | ICD-10-CM | POA: Diagnosis not present

## 2014-05-28 DIAGNOSIS — Z794 Long term (current) use of insulin: Secondary | ICD-10-CM | POA: Diagnosis not present

## 2014-05-28 DIAGNOSIS — Z79899 Other long term (current) drug therapy: Secondary | ICD-10-CM | POA: Diagnosis not present

## 2014-05-28 DIAGNOSIS — S90424A Blister (nonthermal), right lesser toe(s), initial encounter: Secondary | ICD-10-CM | POA: Diagnosis not present

## 2014-05-28 DIAGNOSIS — E119 Type 2 diabetes mellitus without complications: Secondary | ICD-10-CM | POA: Diagnosis not present

## 2014-05-28 DIAGNOSIS — Z7982 Long term (current) use of aspirin: Secondary | ICD-10-CM | POA: Diagnosis not present

## 2014-05-28 DIAGNOSIS — Z833 Family history of diabetes mellitus: Secondary | ICD-10-CM | POA: Diagnosis not present

## 2014-05-28 DIAGNOSIS — T25232A Burn of second degree of left toe(s) (nail), initial encounter: Secondary | ICD-10-CM | POA: Diagnosis not present

## 2014-05-28 DIAGNOSIS — S90422A Blister (nonthermal), left great toe, initial encounter: Secondary | ICD-10-CM | POA: Diagnosis not present

## 2014-05-28 DIAGNOSIS — S90425A Blister (nonthermal), left lesser toe(s), initial encounter: Secondary | ICD-10-CM | POA: Diagnosis not present

## 2014-05-28 DIAGNOSIS — Z888 Allergy status to other drugs, medicaments and biological substances status: Secondary | ICD-10-CM | POA: Diagnosis not present

## 2014-05-28 DIAGNOSIS — T25231A Burn of second degree of right toe(s) (nail), initial encounter: Secondary | ICD-10-CM | POA: Diagnosis not present

## 2014-06-03 DIAGNOSIS — E1142 Type 2 diabetes mellitus with diabetic polyneuropathy: Secondary | ICD-10-CM | POA: Diagnosis not present

## 2014-06-03 DIAGNOSIS — E11621 Type 2 diabetes mellitus with foot ulcer: Secondary | ICD-10-CM | POA: Diagnosis not present

## 2014-06-03 DIAGNOSIS — E1165 Type 2 diabetes mellitus with hyperglycemia: Secondary | ICD-10-CM | POA: Diagnosis not present

## 2014-06-05 ENCOUNTER — Ambulatory Visit (INDEPENDENT_AMBULATORY_CARE_PROVIDER_SITE_OTHER): Payer: Medicare Other | Admitting: Ophthalmology

## 2014-06-05 DIAGNOSIS — M5136 Other intervertebral disc degeneration, lumbar region: Secondary | ICD-10-CM | POA: Diagnosis not present

## 2014-06-05 DIAGNOSIS — M79672 Pain in left foot: Secondary | ICD-10-CM | POA: Diagnosis not present

## 2014-06-05 DIAGNOSIS — M4806 Spinal stenosis, lumbar region: Secondary | ICD-10-CM | POA: Diagnosis not present

## 2014-06-05 DIAGNOSIS — M545 Low back pain: Secondary | ICD-10-CM | POA: Diagnosis not present

## 2014-06-05 DIAGNOSIS — B351 Tinea unguium: Secondary | ICD-10-CM | POA: Diagnosis not present

## 2014-06-05 DIAGNOSIS — M79671 Pain in right foot: Secondary | ICD-10-CM | POA: Diagnosis not present

## 2014-06-05 DIAGNOSIS — M47816 Spondylosis without myelopathy or radiculopathy, lumbar region: Secondary | ICD-10-CM | POA: Diagnosis not present

## 2014-06-06 ENCOUNTER — Ambulatory Visit (INDEPENDENT_AMBULATORY_CARE_PROVIDER_SITE_OTHER): Payer: Medicare Other | Admitting: Ophthalmology

## 2014-06-11 DIAGNOSIS — T25232D Burn of second degree of left toe(s) (nail), subsequent encounter: Secondary | ICD-10-CM | POA: Diagnosis not present

## 2014-06-11 DIAGNOSIS — T25231D Burn of second degree of right toe(s) (nail), subsequent encounter: Secondary | ICD-10-CM | POA: Diagnosis not present

## 2014-06-25 DIAGNOSIS — M1990 Unspecified osteoarthritis, unspecified site: Secondary | ICD-10-CM | POA: Diagnosis not present

## 2014-06-25 DIAGNOSIS — Z794 Long term (current) use of insulin: Secondary | ICD-10-CM | POA: Diagnosis not present

## 2014-06-25 DIAGNOSIS — Z883 Allergy status to other anti-infective agents status: Secondary | ICD-10-CM | POA: Diagnosis not present

## 2014-06-25 DIAGNOSIS — T25232D Burn of second degree of left toe(s) (nail), subsequent encounter: Secondary | ICD-10-CM | POA: Diagnosis not present

## 2014-06-25 DIAGNOSIS — G8929 Other chronic pain: Secondary | ICD-10-CM | POA: Diagnosis not present

## 2014-06-25 DIAGNOSIS — Z7982 Long term (current) use of aspirin: Secondary | ICD-10-CM | POA: Diagnosis not present

## 2014-06-25 DIAGNOSIS — Z79899 Other long term (current) drug therapy: Secondary | ICD-10-CM | POA: Diagnosis not present

## 2014-06-25 DIAGNOSIS — I10 Essential (primary) hypertension: Secondary | ICD-10-CM | POA: Diagnosis not present

## 2014-06-25 DIAGNOSIS — Z8673 Personal history of transient ischemic attack (TIA), and cerebral infarction without residual deficits: Secondary | ICD-10-CM | POA: Diagnosis not present

## 2014-06-25 DIAGNOSIS — T25231D Burn of second degree of right toe(s) (nail), subsequent encounter: Secondary | ICD-10-CM | POA: Diagnosis not present

## 2014-06-25 DIAGNOSIS — M47816 Spondylosis without myelopathy or radiculopathy, lumbar region: Secondary | ICD-10-CM | POA: Diagnosis not present

## 2014-06-25 DIAGNOSIS — E0840 Diabetes mellitus due to underlying condition with diabetic neuropathy, unspecified: Secondary | ICD-10-CM | POA: Diagnosis not present

## 2014-06-25 DIAGNOSIS — Z87891 Personal history of nicotine dependence: Secondary | ICD-10-CM | POA: Diagnosis not present

## 2014-06-25 DIAGNOSIS — M5136 Other intervertebral disc degeneration, lumbar region: Secondary | ICD-10-CM | POA: Diagnosis not present

## 2014-06-25 DIAGNOSIS — M4806 Spinal stenosis, lumbar region: Secondary | ICD-10-CM | POA: Diagnosis not present

## 2014-07-17 ENCOUNTER — Ambulatory Visit (INDEPENDENT_AMBULATORY_CARE_PROVIDER_SITE_OTHER): Payer: Medicare Other | Admitting: Ophthalmology

## 2014-07-17 DIAGNOSIS — I10 Essential (primary) hypertension: Secondary | ICD-10-CM | POA: Diagnosis not present

## 2014-07-17 DIAGNOSIS — M545 Low back pain: Secondary | ICD-10-CM | POA: Diagnosis not present

## 2014-07-17 DIAGNOSIS — E11319 Type 2 diabetes mellitus with unspecified diabetic retinopathy without macular edema: Secondary | ICD-10-CM

## 2014-07-17 DIAGNOSIS — H35033 Hypertensive retinopathy, bilateral: Secondary | ICD-10-CM | POA: Diagnosis not present

## 2014-07-17 DIAGNOSIS — M5136 Other intervertebral disc degeneration, lumbar region: Secondary | ICD-10-CM | POA: Diagnosis not present

## 2014-07-17 DIAGNOSIS — H43813 Vitreous degeneration, bilateral: Secondary | ICD-10-CM

## 2014-07-17 DIAGNOSIS — E11359 Type 2 diabetes mellitus with proliferative diabetic retinopathy without macular edema: Secondary | ICD-10-CM | POA: Diagnosis not present

## 2014-07-17 DIAGNOSIS — M4806 Spinal stenosis, lumbar region: Secondary | ICD-10-CM | POA: Diagnosis not present

## 2014-07-17 DIAGNOSIS — M47816 Spondylosis without myelopathy or radiculopathy, lumbar region: Secondary | ICD-10-CM | POA: Diagnosis not present

## 2014-07-17 DIAGNOSIS — Z79891 Long term (current) use of opiate analgesic: Secondary | ICD-10-CM | POA: Diagnosis not present

## 2014-08-05 DIAGNOSIS — Z1231 Encounter for screening mammogram for malignant neoplasm of breast: Secondary | ICD-10-CM | POA: Diagnosis not present

## 2014-08-12 DIAGNOSIS — G6289 Other specified polyneuropathies: Secondary | ICD-10-CM | POA: Diagnosis not present

## 2014-08-12 DIAGNOSIS — E6609 Other obesity due to excess calories: Secondary | ICD-10-CM | POA: Diagnosis not present

## 2014-08-12 DIAGNOSIS — I10 Essential (primary) hypertension: Secondary | ICD-10-CM | POA: Diagnosis not present

## 2014-08-12 DIAGNOSIS — E1142 Type 2 diabetes mellitus with diabetic polyneuropathy: Secondary | ICD-10-CM | POA: Diagnosis not present

## 2014-08-12 DIAGNOSIS — E782 Mixed hyperlipidemia: Secondary | ICD-10-CM | POA: Diagnosis not present

## 2014-08-14 DIAGNOSIS — M5136 Other intervertebral disc degeneration, lumbar region: Secondary | ICD-10-CM | POA: Diagnosis not present

## 2014-08-14 DIAGNOSIS — Z87891 Personal history of nicotine dependence: Secondary | ICD-10-CM | POA: Diagnosis not present

## 2014-08-14 DIAGNOSIS — M4806 Spinal stenosis, lumbar region: Secondary | ICD-10-CM | POA: Diagnosis not present

## 2014-08-14 DIAGNOSIS — Z79891 Long term (current) use of opiate analgesic: Secondary | ICD-10-CM | POA: Diagnosis not present

## 2014-08-14 DIAGNOSIS — Z7982 Long term (current) use of aspirin: Secondary | ICD-10-CM | POA: Diagnosis not present

## 2014-08-14 DIAGNOSIS — E0840 Diabetes mellitus due to underlying condition with diabetic neuropathy, unspecified: Secondary | ICD-10-CM | POA: Diagnosis not present

## 2014-08-14 DIAGNOSIS — G8929 Other chronic pain: Secondary | ICD-10-CM | POA: Diagnosis not present

## 2014-08-14 DIAGNOSIS — Z79899 Other long term (current) drug therapy: Secondary | ICD-10-CM | POA: Diagnosis not present

## 2014-08-14 DIAGNOSIS — I10 Essential (primary) hypertension: Secondary | ICD-10-CM | POA: Diagnosis not present

## 2014-08-14 DIAGNOSIS — Z794 Long term (current) use of insulin: Secondary | ICD-10-CM | POA: Diagnosis not present

## 2014-08-14 DIAGNOSIS — M1990 Unspecified osteoarthritis, unspecified site: Secondary | ICD-10-CM | POA: Diagnosis not present

## 2014-08-14 DIAGNOSIS — M47816 Spondylosis without myelopathy or radiculopathy, lumbar region: Secondary | ICD-10-CM | POA: Diagnosis not present

## 2014-08-14 DIAGNOSIS — Z8673 Personal history of transient ischemic attack (TIA), and cerebral infarction without residual deficits: Secondary | ICD-10-CM | POA: Diagnosis not present

## 2014-08-19 DIAGNOSIS — E119 Type 2 diabetes mellitus without complications: Secondary | ICD-10-CM | POA: Diagnosis not present

## 2014-08-20 DIAGNOSIS — E1142 Type 2 diabetes mellitus with diabetic polyneuropathy: Secondary | ICD-10-CM | POA: Diagnosis not present

## 2014-08-20 DIAGNOSIS — E782 Mixed hyperlipidemia: Secondary | ICD-10-CM | POA: Diagnosis not present

## 2014-08-20 DIAGNOSIS — E6609 Other obesity due to excess calories: Secondary | ICD-10-CM | POA: Diagnosis not present

## 2014-08-20 DIAGNOSIS — E11621 Type 2 diabetes mellitus with foot ulcer: Secondary | ICD-10-CM | POA: Diagnosis not present

## 2014-08-20 DIAGNOSIS — E1165 Type 2 diabetes mellitus with hyperglycemia: Secondary | ICD-10-CM | POA: Diagnosis not present

## 2014-08-21 DIAGNOSIS — E1140 Type 2 diabetes mellitus with diabetic neuropathy, unspecified: Secondary | ICD-10-CM | POA: Diagnosis not present

## 2014-08-21 DIAGNOSIS — B351 Tinea unguium: Secondary | ICD-10-CM | POA: Diagnosis not present

## 2014-09-17 DIAGNOSIS — M47816 Spondylosis without myelopathy or radiculopathy, lumbar region: Secondary | ICD-10-CM | POA: Diagnosis not present

## 2014-09-17 DIAGNOSIS — Z87891 Personal history of nicotine dependence: Secondary | ICD-10-CM | POA: Diagnosis not present

## 2014-09-17 DIAGNOSIS — M5136 Other intervertebral disc degeneration, lumbar region: Secondary | ICD-10-CM | POA: Diagnosis not present

## 2014-09-17 DIAGNOSIS — M1990 Unspecified osteoarthritis, unspecified site: Secondary | ICD-10-CM | POA: Diagnosis not present

## 2014-09-17 DIAGNOSIS — Z79899 Other long term (current) drug therapy: Secondary | ICD-10-CM | POA: Diagnosis not present

## 2014-09-17 DIAGNOSIS — G8929 Other chronic pain: Secondary | ICD-10-CM | POA: Diagnosis not present

## 2014-09-17 DIAGNOSIS — M4806 Spinal stenosis, lumbar region: Secondary | ICD-10-CM | POA: Diagnosis not present

## 2014-09-17 DIAGNOSIS — Z7982 Long term (current) use of aspirin: Secondary | ICD-10-CM | POA: Diagnosis not present

## 2014-09-17 DIAGNOSIS — Z794 Long term (current) use of insulin: Secondary | ICD-10-CM | POA: Diagnosis not present

## 2014-09-17 DIAGNOSIS — Z79891 Long term (current) use of opiate analgesic: Secondary | ICD-10-CM | POA: Diagnosis not present

## 2014-09-17 DIAGNOSIS — Z883 Allergy status to other anti-infective agents status: Secondary | ICD-10-CM | POA: Diagnosis not present

## 2014-09-17 DIAGNOSIS — I10 Essential (primary) hypertension: Secondary | ICD-10-CM | POA: Diagnosis not present

## 2014-09-17 DIAGNOSIS — Z8673 Personal history of transient ischemic attack (TIA), and cerebral infarction without residual deficits: Secondary | ICD-10-CM | POA: Diagnosis not present

## 2014-09-17 DIAGNOSIS — E0840 Diabetes mellitus due to underlying condition with diabetic neuropathy, unspecified: Secondary | ICD-10-CM | POA: Diagnosis not present

## 2014-10-30 DIAGNOSIS — B351 Tinea unguium: Secondary | ICD-10-CM | POA: Diagnosis not present

## 2014-10-30 DIAGNOSIS — E1140 Type 2 diabetes mellitus with diabetic neuropathy, unspecified: Secondary | ICD-10-CM | POA: Diagnosis not present

## 2015-01-08 DIAGNOSIS — B351 Tinea unguium: Secondary | ICD-10-CM | POA: Diagnosis not present

## 2015-01-08 DIAGNOSIS — E1140 Type 2 diabetes mellitus with diabetic neuropathy, unspecified: Secondary | ICD-10-CM | POA: Diagnosis not present

## 2015-01-22 ENCOUNTER — Ambulatory Visit (INDEPENDENT_AMBULATORY_CARE_PROVIDER_SITE_OTHER): Payer: Medicare Other | Admitting: Ophthalmology

## 2015-01-22 DIAGNOSIS — E11311 Type 2 diabetes mellitus with unspecified diabetic retinopathy with macular edema: Secondary | ICD-10-CM

## 2015-01-22 DIAGNOSIS — H43813 Vitreous degeneration, bilateral: Secondary | ICD-10-CM

## 2015-01-22 DIAGNOSIS — I10 Essential (primary) hypertension: Secondary | ICD-10-CM

## 2015-01-22 DIAGNOSIS — H35033 Hypertensive retinopathy, bilateral: Secondary | ICD-10-CM

## 2015-01-22 DIAGNOSIS — E113592 Type 2 diabetes mellitus with proliferative diabetic retinopathy without macular edema, left eye: Secondary | ICD-10-CM | POA: Diagnosis not present

## 2015-01-22 DIAGNOSIS — E113511 Type 2 diabetes mellitus with proliferative diabetic retinopathy with macular edema, right eye: Secondary | ICD-10-CM

## 2015-01-29 DIAGNOSIS — I10 Essential (primary) hypertension: Secondary | ICD-10-CM | POA: Diagnosis not present

## 2015-01-29 DIAGNOSIS — E782 Mixed hyperlipidemia: Secondary | ICD-10-CM | POA: Diagnosis not present

## 2015-01-29 DIAGNOSIS — E538 Deficiency of other specified B group vitamins: Secondary | ICD-10-CM | POA: Diagnosis not present

## 2015-01-29 DIAGNOSIS — E1165 Type 2 diabetes mellitus with hyperglycemia: Secondary | ICD-10-CM | POA: Diagnosis not present

## 2015-02-04 DIAGNOSIS — I10 Essential (primary) hypertension: Secondary | ICD-10-CM | POA: Diagnosis not present

## 2015-02-04 DIAGNOSIS — E1165 Type 2 diabetes mellitus with hyperglycemia: Secondary | ICD-10-CM | POA: Diagnosis not present

## 2015-02-04 DIAGNOSIS — E782 Mixed hyperlipidemia: Secondary | ICD-10-CM | POA: Diagnosis not present

## 2015-02-04 DIAGNOSIS — E6609 Other obesity due to excess calories: Secondary | ICD-10-CM | POA: Diagnosis not present

## 2015-02-04 DIAGNOSIS — E538 Deficiency of other specified B group vitamins: Secondary | ICD-10-CM | POA: Diagnosis not present

## 2015-04-01 DIAGNOSIS — M545 Low back pain: Secondary | ICD-10-CM | POA: Diagnosis not present

## 2015-04-01 DIAGNOSIS — M5136 Other intervertebral disc degeneration, lumbar region: Secondary | ICD-10-CM | POA: Diagnosis not present

## 2015-04-01 DIAGNOSIS — M47817 Spondylosis without myelopathy or radiculopathy, lumbosacral region: Secondary | ICD-10-CM | POA: Diagnosis not present

## 2015-04-01 DIAGNOSIS — M4806 Spinal stenosis, lumbar region: Secondary | ICD-10-CM | POA: Diagnosis not present

## 2015-04-03 DIAGNOSIS — B351 Tinea unguium: Secondary | ICD-10-CM | POA: Diagnosis not present

## 2015-04-03 DIAGNOSIS — E1140 Type 2 diabetes mellitus with diabetic neuropathy, unspecified: Secondary | ICD-10-CM | POA: Diagnosis not present

## 2015-04-28 DIAGNOSIS — E0840 Diabetes mellitus due to underlying condition with diabetic neuropathy, unspecified: Secondary | ICD-10-CM | POA: Diagnosis not present

## 2015-04-28 DIAGNOSIS — M5136 Other intervertebral disc degeneration, lumbar region: Secondary | ICD-10-CM | POA: Diagnosis not present

## 2015-04-28 DIAGNOSIS — Z8673 Personal history of transient ischemic attack (TIA), and cerebral infarction without residual deficits: Secondary | ICD-10-CM | POA: Diagnosis not present

## 2015-04-28 DIAGNOSIS — Z883 Allergy status to other anti-infective agents status: Secondary | ICD-10-CM | POA: Diagnosis not present

## 2015-04-28 DIAGNOSIS — M47816 Spondylosis without myelopathy or radiculopathy, lumbar region: Secondary | ICD-10-CM | POA: Diagnosis not present

## 2015-04-28 DIAGNOSIS — M4806 Spinal stenosis, lumbar region: Secondary | ICD-10-CM | POA: Diagnosis not present

## 2015-04-28 DIAGNOSIS — I10 Essential (primary) hypertension: Secondary | ICD-10-CM | POA: Diagnosis not present

## 2015-04-28 DIAGNOSIS — Z794 Long term (current) use of insulin: Secondary | ICD-10-CM | POA: Diagnosis not present

## 2015-04-28 DIAGNOSIS — Z7984 Long term (current) use of oral hypoglycemic drugs: Secondary | ICD-10-CM | POA: Diagnosis not present

## 2015-04-28 DIAGNOSIS — M545 Low back pain: Secondary | ICD-10-CM | POA: Diagnosis not present

## 2015-04-28 DIAGNOSIS — M47817 Spondylosis without myelopathy or radiculopathy, lumbosacral region: Secondary | ICD-10-CM | POA: Diagnosis not present

## 2015-04-28 DIAGNOSIS — M1990 Unspecified osteoarthritis, unspecified site: Secondary | ICD-10-CM | POA: Diagnosis not present

## 2015-04-28 DIAGNOSIS — Z87891 Personal history of nicotine dependence: Secondary | ICD-10-CM | POA: Diagnosis not present

## 2015-04-28 DIAGNOSIS — Z79899 Other long term (current) drug therapy: Secondary | ICD-10-CM | POA: Diagnosis not present

## 2015-04-28 DIAGNOSIS — Z7982 Long term (current) use of aspirin: Secondary | ICD-10-CM | POA: Diagnosis not present

## 2015-05-09 DIAGNOSIS — E1142 Type 2 diabetes mellitus with diabetic polyneuropathy: Secondary | ICD-10-CM | POA: Diagnosis not present

## 2015-05-09 DIAGNOSIS — E1165 Type 2 diabetes mellitus with hyperglycemia: Secondary | ICD-10-CM | POA: Diagnosis not present

## 2015-05-09 DIAGNOSIS — M25551 Pain in right hip: Secondary | ICD-10-CM | POA: Diagnosis not present

## 2015-05-09 DIAGNOSIS — I10 Essential (primary) hypertension: Secondary | ICD-10-CM | POA: Diagnosis not present

## 2015-05-20 DIAGNOSIS — Z79891 Long term (current) use of opiate analgesic: Secondary | ICD-10-CM | POA: Diagnosis not present

## 2015-05-20 DIAGNOSIS — E0840 Diabetes mellitus due to underlying condition with diabetic neuropathy, unspecified: Secondary | ICD-10-CM | POA: Diagnosis not present

## 2015-05-20 DIAGNOSIS — M5136 Other intervertebral disc degeneration, lumbar region: Secondary | ICD-10-CM | POA: Diagnosis not present

## 2015-05-22 DIAGNOSIS — M79671 Pain in right foot: Secondary | ICD-10-CM | POA: Diagnosis not present

## 2015-05-22 DIAGNOSIS — M79674 Pain in right toe(s): Secondary | ICD-10-CM | POA: Diagnosis not present

## 2015-06-12 DIAGNOSIS — E1140 Type 2 diabetes mellitus with diabetic neuropathy, unspecified: Secondary | ICD-10-CM | POA: Diagnosis not present

## 2015-06-12 DIAGNOSIS — B351 Tinea unguium: Secondary | ICD-10-CM | POA: Diagnosis not present

## 2015-06-30 DIAGNOSIS — Z79891 Long term (current) use of opiate analgesic: Secondary | ICD-10-CM | POA: Diagnosis not present

## 2015-07-28 ENCOUNTER — Ambulatory Visit (INDEPENDENT_AMBULATORY_CARE_PROVIDER_SITE_OTHER): Payer: Medicare Other | Admitting: Ophthalmology

## 2015-07-28 DIAGNOSIS — H26492 Other secondary cataract, left eye: Secondary | ICD-10-CM | POA: Diagnosis not present

## 2015-07-28 DIAGNOSIS — H2703 Aphakia, bilateral: Secondary | ICD-10-CM

## 2015-07-28 DIAGNOSIS — H35033 Hypertensive retinopathy, bilateral: Secondary | ICD-10-CM

## 2015-07-28 DIAGNOSIS — E11319 Type 2 diabetes mellitus with unspecified diabetic retinopathy without macular edema: Secondary | ICD-10-CM | POA: Diagnosis not present

## 2015-07-28 DIAGNOSIS — I10 Essential (primary) hypertension: Secondary | ICD-10-CM

## 2015-07-28 DIAGNOSIS — H43813 Vitreous degeneration, bilateral: Secondary | ICD-10-CM | POA: Diagnosis not present

## 2015-07-28 DIAGNOSIS — E113593 Type 2 diabetes mellitus with proliferative diabetic retinopathy without macular edema, bilateral: Secondary | ICD-10-CM

## 2015-07-28 DIAGNOSIS — Z961 Presence of intraocular lens: Secondary | ICD-10-CM | POA: Diagnosis not present

## 2015-07-29 DIAGNOSIS — E1165 Type 2 diabetes mellitus with hyperglycemia: Secondary | ICD-10-CM | POA: Diagnosis not present

## 2015-07-29 DIAGNOSIS — E538 Deficiency of other specified B group vitamins: Secondary | ICD-10-CM | POA: Diagnosis not present

## 2015-07-29 DIAGNOSIS — E782 Mixed hyperlipidemia: Secondary | ICD-10-CM | POA: Diagnosis not present

## 2015-07-29 DIAGNOSIS — I10 Essential (primary) hypertension: Secondary | ICD-10-CM | POA: Diagnosis not present

## 2015-08-04 DIAGNOSIS — L988 Other specified disorders of the skin and subcutaneous tissue: Secondary | ICD-10-CM | POA: Diagnosis not present

## 2015-08-04 DIAGNOSIS — E11621 Type 2 diabetes mellitus with foot ulcer: Secondary | ICD-10-CM | POA: Diagnosis not present

## 2015-08-04 DIAGNOSIS — E1142 Type 2 diabetes mellitus with diabetic polyneuropathy: Secondary | ICD-10-CM | POA: Diagnosis not present

## 2015-08-04 DIAGNOSIS — E1165 Type 2 diabetes mellitus with hyperglycemia: Secondary | ICD-10-CM | POA: Diagnosis not present

## 2015-08-04 DIAGNOSIS — M545 Low back pain: Secondary | ICD-10-CM | POA: Diagnosis not present

## 2015-08-22 DIAGNOSIS — Z1231 Encounter for screening mammogram for malignant neoplasm of breast: Secondary | ICD-10-CM | POA: Diagnosis not present

## 2015-09-04 DIAGNOSIS — B351 Tinea unguium: Secondary | ICD-10-CM | POA: Diagnosis not present

## 2015-09-04 DIAGNOSIS — E1140 Type 2 diabetes mellitus with diabetic neuropathy, unspecified: Secondary | ICD-10-CM | POA: Diagnosis not present

## 2015-11-13 DIAGNOSIS — B351 Tinea unguium: Secondary | ICD-10-CM | POA: Diagnosis not present

## 2015-11-13 DIAGNOSIS — E1140 Type 2 diabetes mellitus with diabetic neuropathy, unspecified: Secondary | ICD-10-CM | POA: Diagnosis not present

## 2015-11-19 DIAGNOSIS — M5430 Sciatica, unspecified side: Secondary | ICD-10-CM | POA: Diagnosis not present

## 2015-11-19 DIAGNOSIS — E11621 Type 2 diabetes mellitus with foot ulcer: Secondary | ICD-10-CM | POA: Diagnosis not present

## 2015-11-19 DIAGNOSIS — I10 Essential (primary) hypertension: Secondary | ICD-10-CM | POA: Diagnosis not present

## 2015-11-19 DIAGNOSIS — E1165 Type 2 diabetes mellitus with hyperglycemia: Secondary | ICD-10-CM | POA: Diagnosis not present

## 2015-11-19 DIAGNOSIS — E1142 Type 2 diabetes mellitus with diabetic polyneuropathy: Secondary | ICD-10-CM | POA: Diagnosis not present

## 2015-12-05 DIAGNOSIS — I779 Disorder of arteries and arterioles, unspecified: Secondary | ICD-10-CM | POA: Diagnosis not present

## 2015-12-05 DIAGNOSIS — I739 Peripheral vascular disease, unspecified: Secondary | ICD-10-CM | POA: Diagnosis not present

## 2015-12-05 DIAGNOSIS — I743 Embolism and thrombosis of arteries of the lower extremities: Secondary | ICD-10-CM | POA: Diagnosis not present

## 2016-01-12 DIAGNOSIS — M4800 Spinal stenosis, site unspecified: Secondary | ICD-10-CM | POA: Diagnosis not present

## 2016-01-12 DIAGNOSIS — M47816 Spondylosis without myelopathy or radiculopathy, lumbar region: Secondary | ICD-10-CM | POA: Diagnosis not present

## 2016-01-12 DIAGNOSIS — G8929 Other chronic pain: Secondary | ICD-10-CM | POA: Diagnosis not present

## 2016-01-12 DIAGNOSIS — Z79891 Long term (current) use of opiate analgesic: Secondary | ICD-10-CM | POA: Diagnosis not present

## 2016-01-12 DIAGNOSIS — Z79899 Other long term (current) drug therapy: Secondary | ICD-10-CM | POA: Diagnosis not present

## 2016-01-17 DIAGNOSIS — W1839XA Other fall on same level, initial encounter: Secondary | ICD-10-CM | POA: Diagnosis not present

## 2016-01-17 DIAGNOSIS — Z79899 Other long term (current) drug therapy: Secondary | ICD-10-CM | POA: Diagnosis not present

## 2016-01-17 DIAGNOSIS — R51 Headache: Secondary | ICD-10-CM | POA: Diagnosis not present

## 2016-01-17 DIAGNOSIS — R319 Hematuria, unspecified: Secondary | ICD-10-CM | POA: Diagnosis not present

## 2016-01-17 DIAGNOSIS — S0990XA Unspecified injury of head, initial encounter: Secondary | ICD-10-CM | POA: Diagnosis not present

## 2016-01-17 DIAGNOSIS — E1140 Type 2 diabetes mellitus with diabetic neuropathy, unspecified: Secondary | ICD-10-CM | POA: Diagnosis not present

## 2016-01-17 DIAGNOSIS — E860 Dehydration: Secondary | ICD-10-CM | POA: Diagnosis not present

## 2016-01-17 DIAGNOSIS — Z794 Long term (current) use of insulin: Secondary | ICD-10-CM | POA: Diagnosis not present

## 2016-01-17 DIAGNOSIS — R531 Weakness: Secondary | ICD-10-CM | POA: Diagnosis not present

## 2016-01-17 DIAGNOSIS — R404 Transient alteration of awareness: Secondary | ICD-10-CM | POA: Diagnosis not present

## 2016-01-17 DIAGNOSIS — N390 Urinary tract infection, site not specified: Secondary | ICD-10-CM | POA: Diagnosis not present

## 2016-02-02 ENCOUNTER — Ambulatory Visit (INDEPENDENT_AMBULATORY_CARE_PROVIDER_SITE_OTHER): Payer: Medicare Other | Admitting: Ophthalmology

## 2016-02-02 DIAGNOSIS — E113593 Type 2 diabetes mellitus with proliferative diabetic retinopathy without macular edema, bilateral: Secondary | ICD-10-CM

## 2016-02-02 DIAGNOSIS — H43813 Vitreous degeneration, bilateral: Secondary | ICD-10-CM | POA: Diagnosis not present

## 2016-02-02 DIAGNOSIS — E11319 Type 2 diabetes mellitus with unspecified diabetic retinopathy without macular edema: Secondary | ICD-10-CM | POA: Diagnosis not present

## 2016-02-02 DIAGNOSIS — I10 Essential (primary) hypertension: Secondary | ICD-10-CM | POA: Diagnosis not present

## 2016-02-02 DIAGNOSIS — H35033 Hypertensive retinopathy, bilateral: Secondary | ICD-10-CM | POA: Diagnosis not present

## 2016-02-06 DIAGNOSIS — M47816 Spondylosis without myelopathy or radiculopathy, lumbar region: Secondary | ICD-10-CM | POA: Diagnosis not present

## 2016-02-06 DIAGNOSIS — M47817 Spondylosis without myelopathy or radiculopathy, lumbosacral region: Secondary | ICD-10-CM | POA: Diagnosis not present

## 2016-02-06 DIAGNOSIS — M47815 Spondylosis without myelopathy or radiculopathy, thoracolumbar region: Secondary | ICD-10-CM | POA: Diagnosis not present

## 2016-02-06 DIAGNOSIS — M5126 Other intervertebral disc displacement, lumbar region: Secondary | ICD-10-CM | POA: Diagnosis not present

## 2016-02-06 DIAGNOSIS — M5127 Other intervertebral disc displacement, lumbosacral region: Secondary | ICD-10-CM | POA: Diagnosis not present

## 2016-02-24 DIAGNOSIS — L03116 Cellulitis of left lower limb: Secondary | ICD-10-CM | POA: Diagnosis not present

## 2016-03-02 DIAGNOSIS — Z79891 Long term (current) use of opiate analgesic: Secondary | ICD-10-CM | POA: Diagnosis not present

## 2016-03-02 DIAGNOSIS — M4800 Spinal stenosis, site unspecified: Secondary | ICD-10-CM | POA: Diagnosis not present

## 2016-03-02 DIAGNOSIS — G8929 Other chronic pain: Secondary | ICD-10-CM | POA: Diagnosis not present

## 2016-03-02 DIAGNOSIS — M47816 Spondylosis without myelopathy or radiculopathy, lumbar region: Secondary | ICD-10-CM | POA: Diagnosis not present

## 2016-03-18 DIAGNOSIS — M48061 Spinal stenosis, lumbar region without neurogenic claudication: Secondary | ICD-10-CM | POA: Diagnosis not present

## 2016-03-18 DIAGNOSIS — M4306 Spondylolysis, lumbar region: Secondary | ICD-10-CM | POA: Diagnosis not present

## 2016-03-18 DIAGNOSIS — M6281 Muscle weakness (generalized): Secondary | ICD-10-CM | POA: Diagnosis not present

## 2016-03-18 DIAGNOSIS — Z7982 Long term (current) use of aspirin: Secondary | ICD-10-CM | POA: Diagnosis not present

## 2016-03-18 DIAGNOSIS — Z7984 Long term (current) use of oral hypoglycemic drugs: Secondary | ICD-10-CM | POA: Diagnosis not present

## 2016-03-19 DIAGNOSIS — Z7982 Long term (current) use of aspirin: Secondary | ICD-10-CM | POA: Diagnosis not present

## 2016-03-19 DIAGNOSIS — M48061 Spinal stenosis, lumbar region without neurogenic claudication: Secondary | ICD-10-CM | POA: Diagnosis not present

## 2016-03-19 DIAGNOSIS — M6281 Muscle weakness (generalized): Secondary | ICD-10-CM | POA: Diagnosis not present

## 2016-03-19 DIAGNOSIS — M4306 Spondylolysis, lumbar region: Secondary | ICD-10-CM | POA: Diagnosis not present

## 2016-03-19 DIAGNOSIS — Z7984 Long term (current) use of oral hypoglycemic drugs: Secondary | ICD-10-CM | POA: Diagnosis not present

## 2016-03-24 DIAGNOSIS — M48061 Spinal stenosis, lumbar region without neurogenic claudication: Secondary | ICD-10-CM | POA: Diagnosis not present

## 2016-03-24 DIAGNOSIS — M6281 Muscle weakness (generalized): Secondary | ICD-10-CM | POA: Diagnosis not present

## 2016-03-24 DIAGNOSIS — Z7982 Long term (current) use of aspirin: Secondary | ICD-10-CM | POA: Diagnosis not present

## 2016-03-24 DIAGNOSIS — Z7984 Long term (current) use of oral hypoglycemic drugs: Secondary | ICD-10-CM | POA: Diagnosis not present

## 2016-03-24 DIAGNOSIS — M4306 Spondylolysis, lumbar region: Secondary | ICD-10-CM | POA: Diagnosis not present

## 2016-03-26 DIAGNOSIS — Z7982 Long term (current) use of aspirin: Secondary | ICD-10-CM | POA: Diagnosis not present

## 2016-03-26 DIAGNOSIS — M48061 Spinal stenosis, lumbar region without neurogenic claudication: Secondary | ICD-10-CM | POA: Diagnosis not present

## 2016-03-26 DIAGNOSIS — Z7984 Long term (current) use of oral hypoglycemic drugs: Secondary | ICD-10-CM | POA: Diagnosis not present

## 2016-03-26 DIAGNOSIS — M6281 Muscle weakness (generalized): Secondary | ICD-10-CM | POA: Diagnosis not present

## 2016-03-26 DIAGNOSIS — M4306 Spondylolysis, lumbar region: Secondary | ICD-10-CM | POA: Diagnosis not present

## 2016-04-01 DIAGNOSIS — Z7982 Long term (current) use of aspirin: Secondary | ICD-10-CM | POA: Diagnosis not present

## 2016-04-01 DIAGNOSIS — M4306 Spondylolysis, lumbar region: Secondary | ICD-10-CM | POA: Diagnosis not present

## 2016-04-01 DIAGNOSIS — Z7984 Long term (current) use of oral hypoglycemic drugs: Secondary | ICD-10-CM | POA: Diagnosis not present

## 2016-04-01 DIAGNOSIS — M48061 Spinal stenosis, lumbar region without neurogenic claudication: Secondary | ICD-10-CM | POA: Diagnosis not present

## 2016-04-01 DIAGNOSIS — M6281 Muscle weakness (generalized): Secondary | ICD-10-CM | POA: Diagnosis not present

## 2016-04-02 DIAGNOSIS — Z7984 Long term (current) use of oral hypoglycemic drugs: Secondary | ICD-10-CM | POA: Diagnosis not present

## 2016-04-02 DIAGNOSIS — M48061 Spinal stenosis, lumbar region without neurogenic claudication: Secondary | ICD-10-CM | POA: Diagnosis not present

## 2016-04-02 DIAGNOSIS — M4306 Spondylolysis, lumbar region: Secondary | ICD-10-CM | POA: Diagnosis not present

## 2016-04-02 DIAGNOSIS — M6281 Muscle weakness (generalized): Secondary | ICD-10-CM | POA: Diagnosis not present

## 2016-04-02 DIAGNOSIS — Z7982 Long term (current) use of aspirin: Secondary | ICD-10-CM | POA: Diagnosis not present

## 2016-04-27 DIAGNOSIS — Z79891 Long term (current) use of opiate analgesic: Secondary | ICD-10-CM | POA: Diagnosis not present

## 2016-04-27 DIAGNOSIS — M47816 Spondylosis without myelopathy or radiculopathy, lumbar region: Secondary | ICD-10-CM | POA: Diagnosis not present

## 2016-04-27 DIAGNOSIS — M4800 Spinal stenosis, site unspecified: Secondary | ICD-10-CM | POA: Diagnosis not present

## 2016-05-01 DIAGNOSIS — E161 Other hypoglycemia: Secondary | ICD-10-CM | POA: Diagnosis not present

## 2016-05-01 DIAGNOSIS — R531 Weakness: Secondary | ICD-10-CM | POA: Diagnosis not present

## 2016-05-01 DIAGNOSIS — R42 Dizziness and giddiness: Secondary | ICD-10-CM | POA: Diagnosis not present

## 2016-05-01 DIAGNOSIS — Z7982 Long term (current) use of aspirin: Secondary | ICD-10-CM | POA: Diagnosis not present

## 2016-05-01 DIAGNOSIS — Z794 Long term (current) use of insulin: Secondary | ICD-10-CM | POA: Diagnosis not present

## 2016-05-01 DIAGNOSIS — N390 Urinary tract infection, site not specified: Secondary | ICD-10-CM | POA: Diagnosis not present

## 2016-05-01 DIAGNOSIS — I10 Essential (primary) hypertension: Secondary | ICD-10-CM | POA: Diagnosis not present

## 2016-05-01 DIAGNOSIS — E11649 Type 2 diabetes mellitus with hypoglycemia without coma: Secondary | ICD-10-CM | POA: Diagnosis not present

## 2016-05-01 DIAGNOSIS — R404 Transient alteration of awareness: Secondary | ICD-10-CM | POA: Diagnosis not present

## 2016-05-01 DIAGNOSIS — Z79899 Other long term (current) drug therapy: Secondary | ICD-10-CM | POA: Diagnosis not present

## 2016-05-01 DIAGNOSIS — M25561 Pain in right knee: Secondary | ICD-10-CM | POA: Diagnosis not present

## 2016-05-28 DIAGNOSIS — E1142 Type 2 diabetes mellitus with diabetic polyneuropathy: Secondary | ICD-10-CM | POA: Diagnosis not present

## 2016-05-28 DIAGNOSIS — M545 Low back pain: Secondary | ICD-10-CM | POA: Diagnosis not present

## 2016-05-28 DIAGNOSIS — E11649 Type 2 diabetes mellitus with hypoglycemia without coma: Secondary | ICD-10-CM | POA: Diagnosis not present

## 2016-05-28 DIAGNOSIS — M5430 Sciatica, unspecified side: Secondary | ICD-10-CM | POA: Diagnosis not present

## 2016-05-29 DIAGNOSIS — R300 Dysuria: Secondary | ICD-10-CM | POA: Diagnosis not present

## 2016-06-02 DIAGNOSIS — E559 Vitamin D deficiency, unspecified: Secondary | ICD-10-CM | POA: Diagnosis not present

## 2016-06-02 DIAGNOSIS — E538 Deficiency of other specified B group vitamins: Secondary | ICD-10-CM | POA: Diagnosis not present

## 2016-06-02 DIAGNOSIS — E1142 Type 2 diabetes mellitus with diabetic polyneuropathy: Secondary | ICD-10-CM | POA: Diagnosis not present

## 2016-06-02 DIAGNOSIS — E1165 Type 2 diabetes mellitus with hyperglycemia: Secondary | ICD-10-CM | POA: Diagnosis not present

## 2016-06-02 DIAGNOSIS — E782 Mixed hyperlipidemia: Secondary | ICD-10-CM | POA: Diagnosis not present

## 2016-06-11 DIAGNOSIS — M48061 Spinal stenosis, lumbar region without neurogenic claudication: Secondary | ICD-10-CM | POA: Diagnosis not present

## 2016-06-11 DIAGNOSIS — M4716 Other spondylosis with myelopathy, lumbar region: Secondary | ICD-10-CM | POA: Diagnosis not present

## 2016-06-18 DIAGNOSIS — E1165 Type 2 diabetes mellitus with hyperglycemia: Secondary | ICD-10-CM | POA: Diagnosis not present

## 2016-06-18 DIAGNOSIS — E11649 Type 2 diabetes mellitus with hypoglycemia without coma: Secondary | ICD-10-CM | POA: Diagnosis not present

## 2016-06-18 DIAGNOSIS — E11621 Type 2 diabetes mellitus with foot ulcer: Secondary | ICD-10-CM | POA: Diagnosis not present

## 2016-07-13 DIAGNOSIS — G8929 Other chronic pain: Secondary | ICD-10-CM | POA: Diagnosis not present

## 2016-07-13 DIAGNOSIS — E1142 Type 2 diabetes mellitus with diabetic polyneuropathy: Secondary | ICD-10-CM | POA: Diagnosis not present

## 2016-07-13 DIAGNOSIS — Z79891 Long term (current) use of opiate analgesic: Secondary | ICD-10-CM | POA: Diagnosis not present

## 2016-07-13 DIAGNOSIS — M48062 Spinal stenosis, lumbar region with neurogenic claudication: Secondary | ICD-10-CM | POA: Diagnosis not present

## 2016-07-13 DIAGNOSIS — M4716 Other spondylosis with myelopathy, lumbar region: Secondary | ICD-10-CM | POA: Diagnosis not present

## 2016-07-22 DIAGNOSIS — M48061 Spinal stenosis, lumbar region without neurogenic claudication: Secondary | ICD-10-CM | POA: Diagnosis not present

## 2016-07-22 DIAGNOSIS — M4716 Other spondylosis with myelopathy, lumbar region: Secondary | ICD-10-CM | POA: Diagnosis not present

## 2016-07-31 DIAGNOSIS — N390 Urinary tract infection, site not specified: Secondary | ICD-10-CM | POA: Diagnosis not present

## 2016-08-02 ENCOUNTER — Ambulatory Visit (INDEPENDENT_AMBULATORY_CARE_PROVIDER_SITE_OTHER): Payer: Medicare Other | Admitting: Ophthalmology

## 2016-08-02 DIAGNOSIS — I10 Essential (primary) hypertension: Secondary | ICD-10-CM | POA: Diagnosis not present

## 2016-08-02 DIAGNOSIS — H35033 Hypertensive retinopathy, bilateral: Secondary | ICD-10-CM | POA: Diagnosis not present

## 2016-08-02 DIAGNOSIS — H43813 Vitreous degeneration, bilateral: Secondary | ICD-10-CM | POA: Diagnosis not present

## 2016-08-02 DIAGNOSIS — E113593 Type 2 diabetes mellitus with proliferative diabetic retinopathy without macular edema, bilateral: Secondary | ICD-10-CM | POA: Diagnosis not present

## 2016-08-02 DIAGNOSIS — E11319 Type 2 diabetes mellitus with unspecified diabetic retinopathy without macular edema: Secondary | ICD-10-CM

## 2016-09-14 DIAGNOSIS — M4716 Other spondylosis with myelopathy, lumbar region: Secondary | ICD-10-CM | POA: Diagnosis not present

## 2016-09-14 DIAGNOSIS — G8929 Other chronic pain: Secondary | ICD-10-CM | POA: Diagnosis not present

## 2016-09-14 DIAGNOSIS — E1142 Type 2 diabetes mellitus with diabetic polyneuropathy: Secondary | ICD-10-CM | POA: Diagnosis not present

## 2016-09-14 DIAGNOSIS — Z79891 Long term (current) use of opiate analgesic: Secondary | ICD-10-CM | POA: Diagnosis not present

## 2016-09-14 DIAGNOSIS — M48062 Spinal stenosis, lumbar region with neurogenic claudication: Secondary | ICD-10-CM | POA: Diagnosis not present

## 2016-09-29 DIAGNOSIS — R300 Dysuria: Secondary | ICD-10-CM | POA: Diagnosis not present

## 2016-10-15 DIAGNOSIS — E119 Type 2 diabetes mellitus without complications: Secondary | ICD-10-CM | POA: Diagnosis not present

## 2016-10-15 DIAGNOSIS — Z7982 Long term (current) use of aspirin: Secondary | ICD-10-CM | POA: Diagnosis not present

## 2016-10-15 DIAGNOSIS — Z87891 Personal history of nicotine dependence: Secondary | ICD-10-CM | POA: Diagnosis not present

## 2016-10-15 DIAGNOSIS — N390 Urinary tract infection, site not specified: Secondary | ICD-10-CM | POA: Diagnosis not present

## 2016-10-15 DIAGNOSIS — R109 Unspecified abdominal pain: Secondary | ICD-10-CM | POA: Diagnosis not present

## 2016-10-15 DIAGNOSIS — I10 Essential (primary) hypertension: Secondary | ICD-10-CM | POA: Diagnosis not present

## 2016-10-15 DIAGNOSIS — R112 Nausea with vomiting, unspecified: Secondary | ICD-10-CM | POA: Diagnosis not present

## 2016-10-15 DIAGNOSIS — R1110 Vomiting, unspecified: Secondary | ICD-10-CM | POA: Diagnosis not present

## 2016-10-15 DIAGNOSIS — Z79899 Other long term (current) drug therapy: Secondary | ICD-10-CM | POA: Diagnosis not present

## 2016-10-26 DIAGNOSIS — E889 Metabolic disorder, unspecified: Secondary | ICD-10-CM | POA: Diagnosis not present

## 2016-10-26 DIAGNOSIS — M4716 Other spondylosis with myelopathy, lumbar region: Secondary | ICD-10-CM | POA: Diagnosis not present

## 2016-10-26 DIAGNOSIS — G63 Polyneuropathy in diseases classified elsewhere: Secondary | ICD-10-CM | POA: Diagnosis not present

## 2016-10-26 DIAGNOSIS — M48061 Spinal stenosis, lumbar region without neurogenic claudication: Secondary | ICD-10-CM | POA: Diagnosis not present

## 2016-11-01 DIAGNOSIS — Z87440 Personal history of urinary (tract) infections: Secondary | ICD-10-CM | POA: Diagnosis not present

## 2016-11-01 DIAGNOSIS — R1013 Epigastric pain: Secondary | ICD-10-CM | POA: Diagnosis not present

## 2016-11-01 DIAGNOSIS — M545 Low back pain: Secondary | ICD-10-CM | POA: Diagnosis not present

## 2016-11-01 DIAGNOSIS — R142 Eructation: Secondary | ICD-10-CM | POA: Diagnosis not present

## 2016-11-04 ENCOUNTER — Encounter (INDEPENDENT_AMBULATORY_CARE_PROVIDER_SITE_OTHER): Payer: Self-pay

## 2016-11-04 ENCOUNTER — Encounter (INDEPENDENT_AMBULATORY_CARE_PROVIDER_SITE_OTHER): Payer: Self-pay | Admitting: Internal Medicine

## 2016-11-11 DIAGNOSIS — R339 Retention of urine, unspecified: Secondary | ICD-10-CM | POA: Diagnosis not present

## 2016-11-11 DIAGNOSIS — R42 Dizziness and giddiness: Secondary | ICD-10-CM | POA: Diagnosis not present

## 2016-11-11 DIAGNOSIS — Z79899 Other long term (current) drug therapy: Secondary | ICD-10-CM | POA: Diagnosis not present

## 2016-11-11 DIAGNOSIS — E1140 Type 2 diabetes mellitus with diabetic neuropathy, unspecified: Secondary | ICD-10-CM | POA: Diagnosis not present

## 2016-11-11 DIAGNOSIS — Z7982 Long term (current) use of aspirin: Secondary | ICD-10-CM | POA: Diagnosis not present

## 2016-11-11 DIAGNOSIS — R1111 Vomiting without nausea: Secondary | ICD-10-CM | POA: Diagnosis not present

## 2016-11-11 DIAGNOSIS — E119 Type 2 diabetes mellitus without complications: Secondary | ICD-10-CM | POA: Diagnosis not present

## 2016-11-11 DIAGNOSIS — Z87440 Personal history of urinary (tract) infections: Secondary | ICD-10-CM | POA: Diagnosis not present

## 2016-11-11 DIAGNOSIS — I10 Essential (primary) hypertension: Secondary | ICD-10-CM | POA: Diagnosis not present

## 2016-11-11 DIAGNOSIS — I4891 Unspecified atrial fibrillation: Secondary | ICD-10-CM | POA: Diagnosis not present

## 2016-11-11 DIAGNOSIS — Z881 Allergy status to other antibiotic agents status: Secondary | ICD-10-CM | POA: Diagnosis not present

## 2016-11-11 DIAGNOSIS — I959 Hypotension, unspecified: Secondary | ICD-10-CM | POA: Diagnosis not present

## 2016-11-11 DIAGNOSIS — E860 Dehydration: Secondary | ICD-10-CM | POA: Diagnosis not present

## 2016-11-11 DIAGNOSIS — Z87891 Personal history of nicotine dependence: Secondary | ICD-10-CM | POA: Diagnosis not present

## 2016-11-11 DIAGNOSIS — E875 Hyperkalemia: Secondary | ICD-10-CM | POA: Diagnosis not present

## 2016-11-11 DIAGNOSIS — R001 Bradycardia, unspecified: Secondary | ICD-10-CM | POA: Diagnosis not present

## 2016-11-11 DIAGNOSIS — N390 Urinary tract infection, site not specified: Secondary | ICD-10-CM | POA: Diagnosis not present

## 2016-11-11 DIAGNOSIS — Z794 Long term (current) use of insulin: Secondary | ICD-10-CM | POA: Diagnosis not present

## 2016-11-11 DIAGNOSIS — R1110 Vomiting, unspecified: Secondary | ICD-10-CM | POA: Diagnosis not present

## 2016-11-16 DIAGNOSIS — Z466 Encounter for fitting and adjustment of urinary device: Secondary | ICD-10-CM | POA: Diagnosis not present

## 2016-11-16 DIAGNOSIS — I4891 Unspecified atrial fibrillation: Secondary | ICD-10-CM | POA: Diagnosis not present

## 2016-11-16 DIAGNOSIS — Z794 Long term (current) use of insulin: Secondary | ICD-10-CM | POA: Diagnosis not present

## 2016-11-16 DIAGNOSIS — R001 Bradycardia, unspecified: Secondary | ICD-10-CM | POA: Diagnosis not present

## 2016-11-16 DIAGNOSIS — I10 Essential (primary) hypertension: Secondary | ICD-10-CM | POA: Diagnosis not present

## 2016-11-16 DIAGNOSIS — E1140 Type 2 diabetes mellitus with diabetic neuropathy, unspecified: Secondary | ICD-10-CM | POA: Diagnosis not present

## 2016-11-16 DIAGNOSIS — N390 Urinary tract infection, site not specified: Secondary | ICD-10-CM | POA: Diagnosis not present

## 2016-11-16 DIAGNOSIS — Z7982 Long term (current) use of aspirin: Secondary | ICD-10-CM | POA: Diagnosis not present

## 2016-11-16 DIAGNOSIS — R339 Retention of urine, unspecified: Secondary | ICD-10-CM | POA: Diagnosis not present

## 2016-11-19 DIAGNOSIS — R339 Retention of urine, unspecified: Secondary | ICD-10-CM | POA: Diagnosis not present

## 2016-11-19 DIAGNOSIS — I4891 Unspecified atrial fibrillation: Secondary | ICD-10-CM | POA: Diagnosis not present

## 2016-11-19 DIAGNOSIS — Z794 Long term (current) use of insulin: Secondary | ICD-10-CM | POA: Diagnosis not present

## 2016-11-19 DIAGNOSIS — I10 Essential (primary) hypertension: Secondary | ICD-10-CM | POA: Diagnosis not present

## 2016-11-19 DIAGNOSIS — Z7982 Long term (current) use of aspirin: Secondary | ICD-10-CM | POA: Diagnosis not present

## 2016-11-19 DIAGNOSIS — E1140 Type 2 diabetes mellitus with diabetic neuropathy, unspecified: Secondary | ICD-10-CM | POA: Diagnosis not present

## 2016-11-19 DIAGNOSIS — R001 Bradycardia, unspecified: Secondary | ICD-10-CM | POA: Diagnosis not present

## 2016-11-19 DIAGNOSIS — N390 Urinary tract infection, site not specified: Secondary | ICD-10-CM | POA: Diagnosis not present

## 2016-11-19 DIAGNOSIS — Z466 Encounter for fitting and adjustment of urinary device: Secondary | ICD-10-CM | POA: Diagnosis not present

## 2016-11-19 DIAGNOSIS — I482 Chronic atrial fibrillation: Secondary | ICD-10-CM | POA: Diagnosis not present

## 2016-11-22 DIAGNOSIS — Z794 Long term (current) use of insulin: Secondary | ICD-10-CM | POA: Diagnosis not present

## 2016-11-22 DIAGNOSIS — Z87891 Personal history of nicotine dependence: Secondary | ICD-10-CM | POA: Diagnosis not present

## 2016-11-22 DIAGNOSIS — R001 Bradycardia, unspecified: Secondary | ICD-10-CM | POA: Diagnosis not present

## 2016-11-22 DIAGNOSIS — R110 Nausea: Secondary | ICD-10-CM | POA: Diagnosis not present

## 2016-11-22 DIAGNOSIS — R339 Retention of urine, unspecified: Secondary | ICD-10-CM | POA: Diagnosis not present

## 2016-11-22 DIAGNOSIS — I499 Cardiac arrhythmia, unspecified: Secondary | ICD-10-CM | POA: Diagnosis not present

## 2016-11-22 DIAGNOSIS — Z466 Encounter for fitting and adjustment of urinary device: Secondary | ICD-10-CM | POA: Diagnosis not present

## 2016-11-22 DIAGNOSIS — E1140 Type 2 diabetes mellitus with diabetic neuropathy, unspecified: Secondary | ICD-10-CM | POA: Diagnosis not present

## 2016-11-22 DIAGNOSIS — N179 Acute kidney failure, unspecified: Secondary | ICD-10-CM | POA: Diagnosis not present

## 2016-11-22 DIAGNOSIS — N390 Urinary tract infection, site not specified: Secondary | ICD-10-CM | POA: Diagnosis not present

## 2016-11-22 DIAGNOSIS — Z7982 Long term (current) use of aspirin: Secondary | ICD-10-CM | POA: Diagnosis not present

## 2016-11-22 DIAGNOSIS — I4891 Unspecified atrial fibrillation: Secondary | ICD-10-CM | POA: Diagnosis not present

## 2016-11-22 DIAGNOSIS — I10 Essential (primary) hypertension: Secondary | ICD-10-CM | POA: Diagnosis not present

## 2016-11-22 DIAGNOSIS — Z79899 Other long term (current) drug therapy: Secondary | ICD-10-CM | POA: Diagnosis not present

## 2016-11-23 DIAGNOSIS — I10 Essential (primary) hypertension: Secondary | ICD-10-CM | POA: Diagnosis not present

## 2016-11-23 DIAGNOSIS — Z87440 Personal history of urinary (tract) infections: Secondary | ICD-10-CM | POA: Diagnosis not present

## 2016-11-23 DIAGNOSIS — R001 Bradycardia, unspecified: Secondary | ICD-10-CM | POA: Diagnosis not present

## 2016-11-23 DIAGNOSIS — E1142 Type 2 diabetes mellitus with diabetic polyneuropathy: Secondary | ICD-10-CM | POA: Diagnosis not present

## 2016-11-23 DIAGNOSIS — E1165 Type 2 diabetes mellitus with hyperglycemia: Secondary | ICD-10-CM | POA: Diagnosis not present

## 2016-11-24 DIAGNOSIS — Z7982 Long term (current) use of aspirin: Secondary | ICD-10-CM | POA: Diagnosis not present

## 2016-11-24 DIAGNOSIS — R001 Bradycardia, unspecified: Secondary | ICD-10-CM | POA: Diagnosis not present

## 2016-11-24 DIAGNOSIS — E1140 Type 2 diabetes mellitus with diabetic neuropathy, unspecified: Secondary | ICD-10-CM | POA: Diagnosis not present

## 2016-11-24 DIAGNOSIS — N390 Urinary tract infection, site not specified: Secondary | ICD-10-CM | POA: Diagnosis not present

## 2016-11-24 DIAGNOSIS — R339 Retention of urine, unspecified: Secondary | ICD-10-CM | POA: Diagnosis not present

## 2016-11-24 DIAGNOSIS — I10 Essential (primary) hypertension: Secondary | ICD-10-CM | POA: Diagnosis not present

## 2016-11-24 DIAGNOSIS — Z794 Long term (current) use of insulin: Secondary | ICD-10-CM | POA: Diagnosis not present

## 2016-11-24 DIAGNOSIS — Z466 Encounter for fitting and adjustment of urinary device: Secondary | ICD-10-CM | POA: Diagnosis not present

## 2016-11-24 DIAGNOSIS — I4891 Unspecified atrial fibrillation: Secondary | ICD-10-CM | POA: Diagnosis not present

## 2016-11-25 ENCOUNTER — Ambulatory Visit (INDEPENDENT_AMBULATORY_CARE_PROVIDER_SITE_OTHER): Payer: Self-pay | Admitting: Internal Medicine

## 2016-11-25 DIAGNOSIS — R338 Other retention of urine: Secondary | ICD-10-CM | POA: Diagnosis not present

## 2016-11-30 DIAGNOSIS — R001 Bradycardia, unspecified: Secondary | ICD-10-CM | POA: Diagnosis not present

## 2016-11-30 DIAGNOSIS — N390 Urinary tract infection, site not specified: Secondary | ICD-10-CM | POA: Diagnosis not present

## 2016-11-30 DIAGNOSIS — Z79891 Long term (current) use of opiate analgesic: Secondary | ICD-10-CM | POA: Diagnosis not present

## 2016-11-30 DIAGNOSIS — Z794 Long term (current) use of insulin: Secondary | ICD-10-CM | POA: Diagnosis not present

## 2016-11-30 DIAGNOSIS — E1140 Type 2 diabetes mellitus with diabetic neuropathy, unspecified: Secondary | ICD-10-CM | POA: Diagnosis not present

## 2016-11-30 DIAGNOSIS — Z79899 Other long term (current) drug therapy: Secondary | ICD-10-CM | POA: Diagnosis not present

## 2016-11-30 DIAGNOSIS — E1142 Type 2 diabetes mellitus with diabetic polyneuropathy: Secondary | ICD-10-CM | POA: Diagnosis not present

## 2016-11-30 DIAGNOSIS — Z7982 Long term (current) use of aspirin: Secondary | ICD-10-CM | POA: Diagnosis not present

## 2016-11-30 DIAGNOSIS — M25559 Pain in unspecified hip: Secondary | ICD-10-CM | POA: Diagnosis not present

## 2016-11-30 DIAGNOSIS — M47816 Spondylosis without myelopathy or radiculopathy, lumbar region: Secondary | ICD-10-CM | POA: Diagnosis not present

## 2016-11-30 DIAGNOSIS — Z466 Encounter for fitting and adjustment of urinary device: Secondary | ICD-10-CM | POA: Diagnosis not present

## 2016-11-30 DIAGNOSIS — I4891 Unspecified atrial fibrillation: Secondary | ICD-10-CM | POA: Diagnosis not present

## 2016-11-30 DIAGNOSIS — R339 Retention of urine, unspecified: Secondary | ICD-10-CM | POA: Diagnosis not present

## 2016-11-30 DIAGNOSIS — I10 Essential (primary) hypertension: Secondary | ICD-10-CM | POA: Diagnosis not present

## 2016-11-30 DIAGNOSIS — G894 Chronic pain syndrome: Secondary | ICD-10-CM | POA: Diagnosis not present

## 2016-12-08 ENCOUNTER — Ambulatory Visit (INDEPENDENT_AMBULATORY_CARE_PROVIDER_SITE_OTHER): Payer: Self-pay | Admitting: Internal Medicine

## 2016-12-14 DIAGNOSIS — M792 Neuralgia and neuritis, unspecified: Secondary | ICD-10-CM | POA: Diagnosis not present

## 2016-12-14 DIAGNOSIS — G894 Chronic pain syndrome: Secondary | ICD-10-CM | POA: Diagnosis not present

## 2016-12-15 ENCOUNTER — Encounter: Payer: Self-pay | Admitting: *Deleted

## 2016-12-16 ENCOUNTER — Ambulatory Visit (INDEPENDENT_AMBULATORY_CARE_PROVIDER_SITE_OTHER): Payer: Medicare Other | Admitting: Cardiology

## 2016-12-16 ENCOUNTER — Encounter: Payer: Self-pay | Admitting: Cardiology

## 2016-12-16 ENCOUNTER — Encounter: Payer: Self-pay | Admitting: *Deleted

## 2016-12-16 VITALS — BP 121/51 | HR 67 | Ht 71.0 in | Wt 292.0 lb

## 2016-12-16 DIAGNOSIS — R002 Palpitations: Secondary | ICD-10-CM

## 2016-12-16 DIAGNOSIS — R001 Bradycardia, unspecified: Secondary | ICD-10-CM

## 2016-12-16 MED ORDER — DILTIAZEM HCL ER 60 MG PO CP12: 120.0000 mg | ORAL_CAPSULE | Freq: Two times a day (BID) | ORAL | 1 refills | Status: DC

## 2016-12-16 MED ORDER — METOPROLOL SUCCINATE ER 50 MG PO TB24: 50.0000 mg | ORAL_TABLET | Freq: Every day | ORAL | 1 refills | Status: DC

## 2016-12-16 NOTE — Patient Instructions (Signed)
Your physician recommends that you schedule a follow-up appointment in: [place] has recommended you make the following change in your medication:   TOPROL XL 50 MG DAILY  DILTIAZEM 120MG  (2 TABLETS) TWICE DAILY  Thank you for choosing [facility]!!

## 2016-12-16 NOTE — Progress Notes (Signed)
Clinical Summary Brianna Ray is a 71 y.o.female seen as new consult, referred by Dr Nadara Mustard for bradycardia.   1. Bradycardia - 11/2016 admission with UTI, junctinoal bradycardia in 30. She denies any specific symptoms. From discharge summary av nodal agents were held and bradycardia resolved. She was to take lower doses of both dilt and TOprol at discharge however appears she has continued to take the same dose she was on prior to admission -no significnat lightheadedness, dizziness, or syncope   2. Palpitations - seen by cardiology about 10 years ago in [place] per her report - I presume this is why she is on both Toprol and diltiazem - from available history no clear history of prior arrhtynmias  Past Medical History:  Diagnosis Date  . Anxiety   . Cancer ([place])   . Depression   . Diabetes mellitus   . Dysrhythmia   . Fibromyalgia   . Guaiac positive stools   . Hypertension   . Neuromuscular disorder ([place])   . Reflux   . Shortness of breath      Allergies  Allergen Reactions  . Macrodantin [Nitrofurantoin]      Current Outpatient Prescriptions  Medication Sig Dispense Refill  . aspirin 325 MG tablet Take 325 mg by mouth once.      . diltiazem (CARDIZEM) 120 MG tablet Take 120 mg by mouth 3 (three) times daily.      . enalapril (VASOTEC) 10 MG tablet Take 5 mg by mouth.      . ferrous sulfate 325 (65 FE) MG tablet Take 1 tablet (325 mg total) by mouth 2 (two) times daily after a meal. 30 tablet 5  . glyBURIDE (DIABETA) 5 MG tablet 5 mg 2 (two) times daily with a meal.      . insulin glargine (LANTUS) 100 UNIT/ML injection 60 Units 2 (two) times daily.      . insulin lispro (HUMALOG) 100 UNIT/ML injection Inject 5-11 Units into the skin 3 (three) times daily before meals.      . metFORMIN (GLUCOPHAGE) 1000 MG tablet Take 1,000 mg by mouth.       No current facility-administered medications for this visit.      Past Surgical History:  Procedure Laterality  Date  . ABDOMINAL HYSTERECTOMY    . APPENDECTOMY    . CHOLECYSTECTOMY    . COLON SURGERY    . COLONOSCOPY  10/21/2010   Procedure: COLONOSCOPY;  Surgeon: Rogene Houston, MD;  Location: [facility];  Service: Endoscopy;  Laterality: N/A;  . COLONOSCOPY  10/21/2010  . ESOPHAGOGASTRODUODENOSCOPY  10/21/2010   Procedure: ESOPHAGOGASTRODUODENOSCOPY (EGD);  Surgeon: Rogene Houston, MD;  Location: [facility];  Service: Endoscopy;  Laterality: N/A;  . EYE SURGERY    . TONSILLECTOMY  AGE 14     Allergies  Allergen Reactions  . Macrodantin [Nitrofurantoin]       No family history on file.   Social History Ms. Wimer reports that she has quit smoking. She does not have any smokeless tobacco history on file. Ms. Blahnik reports that she does not drink alcohol.   Review of Systems CONSTITUTIONAL: No weight loss, fever, chills, weakness or fatigue.  HEENT: Eyes: No visual loss, blurred vision, double vision or yellow sclerae.No hearing loss, sneezing, congestion, runny nose or sore throat.  SKIN: No rash or itching.  CARDIOVASCULAR: per hpi RESPIRATORY: No shortness of breath, cough or sputum.  GASTROINTESTINAL: No anorexia, nausea, vomiting or diarrhea. No abdominal pain or  blood.  GENITOURINARY: No burning on urination, no polyuria NEUROLOGICAL: No headache, dizziness, syncope, paralysis, ataxia, numbness or tingling in the extremities. No change in bowel or bladder control.  MUSCULOSKELETAL: No muscle, back pain, joint pain or stiffness.  LYMPHATICS: No enlarged nodes. No history of splenectomy.  PSYCHIATRIC: No history of depression or anxiety.  ENDOCRINOLOGIC: No reports of sweating, cold or heat intolerance. No polyuria or polydipsia.  Marland Kitchen   Physical Examination Vitals:   12/16/16 1005 12/16/16 1012  BP: 126/70 (!) 121/51  Pulse: 73 67  SpO2: 96% 95%   Vitals:   12/16/16 1005  Weight: 292 lb (132.5 kg)  Height: 5\' 11"  (1.803 m)    Gen: resting comfortably, no  acute distress HEENT: no scleral icterus, pupils equal round and reactive, no palptable cervical adenopathy,  CV: RRR, no mr/g, no jvd Resp: Clear to auscultation bilaterally GI: abdomen is soft, non-tender, non-distended, normal bowel sounds, no hepatosplenomegaly MSK: extremities are warm, no edema.  Skin: warm, no rash Neuro:  no focal deficits Psych: appropriate affect    Assessment and Plan  1. Bradycardia - junctional bradycardia during recent admission, looks to have been attributed to av nodal agents. She actually mistakingly remains on the same doses she was on prior to admission. EKG today shows SR - we will lower Toprol to 50mg  daily, and diltiazem to 120mg  bid and follow rates. If appears she has been on both these meds for prior issues with palpitations, we will monitor for increased symptoms with dose change.  - f/u labs to see if TSH was included, particularly if she has recurrence of bradycardia     Arnoldo Lenis, M.D.

## 2016-12-21 ENCOUNTER — Encounter: Payer: Self-pay | Admitting: Cardiology

## 2016-12-21 DIAGNOSIS — E782 Mixed hyperlipidemia: Secondary | ICD-10-CM | POA: Diagnosis not present

## 2016-12-21 DIAGNOSIS — I10 Essential (primary) hypertension: Secondary | ICD-10-CM | POA: Diagnosis not present

## 2016-12-21 DIAGNOSIS — Z23 Encounter for immunization: Secondary | ICD-10-CM | POA: Diagnosis not present

## 2016-12-21 DIAGNOSIS — E11649 Type 2 diabetes mellitus with hypoglycemia without coma: Secondary | ICD-10-CM | POA: Diagnosis not present

## 2016-12-21 DIAGNOSIS — E559 Vitamin D deficiency, unspecified: Secondary | ICD-10-CM | POA: Diagnosis not present

## 2016-12-21 DIAGNOSIS — D649 Anemia, unspecified: Secondary | ICD-10-CM | POA: Diagnosis not present

## 2016-12-22 DIAGNOSIS — M47817 Spondylosis without myelopathy or radiculopathy, lumbosacral region: Secondary | ICD-10-CM | POA: Diagnosis not present

## 2016-12-23 ENCOUNTER — Emergency Department (HOSPITAL_COMMUNITY): Payer: Medicare Other

## 2016-12-23 ENCOUNTER — Encounter (HOSPITAL_COMMUNITY): Payer: Self-pay | Admitting: *Deleted

## 2016-12-23 ENCOUNTER — Emergency Department (HOSPITAL_COMMUNITY)
Admission: EM | Admit: 2016-12-23 | Discharge: 2016-12-23 | Disposition: A | Discharge status: Home / Self Care | Payer: Medicare Other | Attending: Emergency Medicine | Admitting: Emergency Medicine

## 2016-12-23 DIAGNOSIS — M79662 Pain in left lower leg: Secondary | ICD-10-CM | POA: Diagnosis not present

## 2016-12-23 DIAGNOSIS — R338 Other retention of urine: Secondary | ICD-10-CM | POA: Diagnosis not present

## 2016-12-23 DIAGNOSIS — Z9049 Acquired absence of other specified parts of digestive tract: Secondary | ICD-10-CM | POA: Diagnosis not present

## 2016-12-23 DIAGNOSIS — F329 Major depressive disorder, single episode, unspecified: Secondary | ICD-10-CM | POA: Diagnosis not present

## 2016-12-23 DIAGNOSIS — R531 Weakness: Secondary | ICD-10-CM

## 2016-12-23 DIAGNOSIS — Z87891 Personal history of nicotine dependence: Secondary | ICD-10-CM | POA: Insufficient documentation

## 2016-12-23 DIAGNOSIS — Z859 Personal history of malignant neoplasm, unspecified: Secondary | ICD-10-CM | POA: Diagnosis not present

## 2016-12-23 DIAGNOSIS — W19XXXA Unspecified fall, initial encounter: Secondary | ICD-10-CM

## 2016-12-23 DIAGNOSIS — Z79899 Other long term (current) drug therapy: Secondary | ICD-10-CM | POA: Insufficient documentation

## 2016-12-23 DIAGNOSIS — E1142 Type 2 diabetes mellitus with diabetic polyneuropathy: Secondary | ICD-10-CM | POA: Diagnosis not present

## 2016-12-23 DIAGNOSIS — Y92009 Unspecified place in unspecified non-institutional (private) residence as the place of occurrence of the external cause: Secondary | ICD-10-CM | POA: Diagnosis not present

## 2016-12-23 DIAGNOSIS — I10 Essential (primary) hypertension: Secondary | ICD-10-CM | POA: Insufficient documentation

## 2016-12-23 DIAGNOSIS — Z794 Long term (current) use of insulin: Secondary | ICD-10-CM | POA: Insufficient documentation

## 2016-12-23 DIAGNOSIS — Y9389 Activity, other specified: Secondary | ICD-10-CM | POA: Diagnosis not present

## 2016-12-23 DIAGNOSIS — E1129 Type 2 diabetes mellitus with other diabetic kidney complication: Secondary | ICD-10-CM | POA: Diagnosis not present

## 2016-12-23 DIAGNOSIS — R339 Retention of urine, unspecified: Secondary | ICD-10-CM | POA: Diagnosis not present

## 2016-12-23 DIAGNOSIS — W108XXA Fall (on) (from) other stairs and steps, initial encounter: Secondary | ICD-10-CM | POA: Diagnosis not present

## 2016-12-23 DIAGNOSIS — Y998 Other external cause status: Secondary | ICD-10-CM | POA: Diagnosis not present

## 2016-12-23 DIAGNOSIS — Z7982 Long term (current) use of aspirin: Secondary | ICD-10-CM | POA: Insufficient documentation

## 2016-12-23 DIAGNOSIS — F419 Anxiety disorder, unspecified: Secondary | ICD-10-CM | POA: Diagnosis not present

## 2016-12-23 DIAGNOSIS — S8992XA Unspecified injury of left lower leg, initial encounter: Secondary | ICD-10-CM | POA: Diagnosis not present

## 2016-12-23 DIAGNOSIS — R0602 Shortness of breath: Secondary | ICD-10-CM | POA: Diagnosis not present

## 2016-12-23 DIAGNOSIS — M6281 Muscle weakness (generalized): Secondary | ICD-10-CM | POA: Diagnosis not present

## 2016-12-23 DIAGNOSIS — M7989 Other specified soft tissue disorders: Secondary | ICD-10-CM | POA: Diagnosis not present

## 2016-12-23 HISTORY — DX: Other intervertebral disc displacement, lumbar region: M51.26

## 2016-12-23 HISTORY — DX: Spinal stenosis, site unspecified: M48.00

## 2016-12-23 LAB — URINALYSIS, ROUTINE W REFLEX MICROSCOPIC
Bilirubin Urine: NEGATIVE
Glucose, UA: NEGATIVE mg/dL
Hgb urine dipstick: NEGATIVE
Ketones, ur: NEGATIVE mg/dL
Leukocytes, UA: NEGATIVE
NITRITE: NEGATIVE
Protein, ur: NEGATIVE mg/dL
SPECIFIC GRAVITY, URINE: 1.014 (ref 1.005–1.030)
pH: 5 (ref 5.0–8.0)

## 2016-12-23 LAB — COMPREHENSIVE METABOLIC PANEL
ALT: 11 U/L — AB (ref 14–54)
ANION GAP: 12 (ref 5–15)
AST: 19 U/L (ref 15–41)
Albumin: 3.6 g/dL (ref 3.5–5.0)
Alkaline Phosphatase: 63 U/L (ref 38–126)
BUN: 24 mg/dL — ABNORMAL HIGH (ref 6–20)
CHLORIDE: 99 mmol/L — AB (ref 101–111)
CO2: 27 mmol/L (ref 22–32)
CREATININE: 1.09 mg/dL — AB (ref 0.44–1.00)
Calcium: 9.3 mg/dL (ref 8.9–10.3)
GFR calc Af Amer: 58 mL/min — ABNORMAL LOW (ref >60)
GFR, EST NON AFRICAN AMERICAN: 50 mL/min — AB (ref >60)
Glucose, Bld: 250 mg/dL — ABNORMAL HIGH (ref 65–99)
Potassium: 3.8 mmol/L (ref 3.5–5.1)
Sodium: 138 mmol/L (ref 135–145)
Total Bilirubin: 0.4 mg/dL (ref 0.3–1.2)
Total Protein: 6.8 g/dL (ref 6.5–8.1)

## 2016-12-23 LAB — CBC WITH DIFFERENTIAL/PLATELET
BASOS PCT: 0 %
Basophils Absolute: 0 10*3/uL (ref 0.0–0.1)
EOS ABS: 0.3 10*3/uL (ref 0.0–0.7)
Eosinophils Relative: 4 %
HCT: 36.1 % (ref 36.0–46.0)
HEMOGLOBIN: 11.8 g/dL — AB (ref 12.0–15.0)
LYMPHS ABS: 0.9 10*3/uL (ref 0.7–4.0)
Lymphocytes Relative: 13 %
MCH: 28.6 pg (ref 26.0–34.0)
MCHC: 32.7 g/dL (ref 30.0–36.0)
MCV: 87.6 fL (ref 78.0–100.0)
Monocytes Absolute: 0.5 10*3/uL (ref 0.1–1.0)
Monocytes Relative: 7 %
NEUTROS ABS: 5.3 10*3/uL (ref 1.7–7.7)
NEUTROS PCT: 76 %
Platelets: 148 10*3/uL — ABNORMAL LOW (ref 150–400)
RBC: 4.12 MIL/uL (ref 3.87–5.11)
RDW: 14.4 % (ref 11.5–15.5)
WBC: 7 10*3/uL (ref 4.0–10.5)

## 2016-12-23 LAB — TROPONIN I

## 2016-12-23 NOTE — ED Triage Notes (Addendum)
Pt brought in by [facility] with c/o fall today getting into her home. Pt normally ambulates small distances with walker. Pt c/o weakness and EMS reports unable to ambulate at this time. Pt has bruise to left lower leg. Denies LOC.

## 2016-12-23 NOTE — ED Notes (Signed)
Pt ambulated in hallway with walker with 2 nursing stand by assist. Pt tolerated okay. EDP at bedside.

## 2016-12-23 NOTE — ED Provider Notes (Signed)
[facility] DEPT Provider Note   CSN: [id number] Arrival date & time: 12/23/16  1522     History   Chief Complaint Chief Complaint  Patient presents with  . Fall  . Weakness    HPI Brianna Ray is a 71 y.o. female.  Pt presents to the ED today with fall and weakness.  The pt was initially admitted in [place] in August for an UTI and bradycardia rates in the 30s.  Bradycardia was due to toprol and diltiazem which was decreased.  She saw Dr. Harl Bowie on 9/13 for follow up.  She had not changed her meds as directed, but has since she saw him and was told that she needs to take lower doses of the diltiazem and the toprol.  Since then, her hr has been normal.  The pt has had frequent UTIs and has been on many different antibiotics lately.  While hospitalized, a foley catheter was placed.  She had urology f/u today who did some bladder testing.  Bladder was not functioning properly, so another foley was placed.  Today, as she was climbing the 3 stairs to her home, she tripped and fell.  She said her legs just did not want to lift up enough to step.  She did hit her left leg, but did not hit her head or have a loc.  She was unable to get up on her own.  EMS had to help her.         Past Medical History:  Diagnosis Date  . Anxiety   . Cancer ([place])   . Depression   . Diabetes mellitus   . Dysrhythmia   . Fibromyalgia   . Guaiac positive stools   . Hypertension   . Neuromuscular disorder ([place])   . Reflux   . Ruptured lumbar disc    x2  . Shortness of breath   . Spinal stenosis     There are no active problems to display for this patient.   Past Surgical History:  Procedure Laterality Date  . ABDOMINAL HYSTERECTOMY    . APPENDECTOMY    . CARPAL TUNNEL RELEASE Bilateral   . CHOLECYSTECTOMY    . COLONOSCOPY  10/21/2010   Procedure: COLONOSCOPY;  Surgeon: Rogene Houston, MD;  [facility];  Service: Endoscopy;  Laterality: N/A;  . COLONOSCOPY  10/21/2010  .  ESOPHAGOGASTRODUODENOSCOPY  10/21/2010   Procedure: ESOPHAGOGASTRODUODENOSCOPY (EGD);  Surgeon: Rogene Houston, MD;  Location[facility];  Service: Endoscopy;  Laterality: N/A;  . EYE SURGERY    . TONSILLECTOMY  AGE 11    OB History    No data available       Home Medications    Prior to Admission medications   Medication Sig Start Date End Date Taking? Authorizing Provider  aspirin EC 81 MG tablet Take 81 mg by mouth daily.    [provider]  cholecalciferol (VITAMIN D) 1000 units tablet Take 1,000 Units by mouth daily.    [provider]  diltiazem (CARDIZEM SR) 60 MG 12 hr capsule Take 2 capsules (120 mg total) by mouth 2 (two) times daily. 12/16/16   Arnoldo Lenis, MD  enalapril (VASOTEC) 10 MG tablet Take 5 mg by mouth.      [provider]  furosemide (LASIX) 40 MG tablet Take 40 mg by mouth.    [provider]  gabapentin (NEURONTIN) 600 MG tablet Take 600 mg by mouth 3 (three) times daily.    [provider]  glyBURIDE (DIABETA) 5 MG tablet 5 mg daily with breakfast.     [provider]  insulin glargine (LANTUS) 100 UNIT/ML injection 20 Units 2 (two) times daily. 20 units in the morning and 25 units in the evening    [provider]  metFORMIN (GLUCOPHAGE) 1000 MG tablet Take 1,000 mg by mouth.      [provider]  metoprolol succinate (TOPROL-XL) 50 MG 24 hr tablet Take 1 tablet (50 mg total) by mouth daily. Take with or immediately following a meal. 12/16/16   Branch, Alphonse Guild, MD  oxyCODONE-acetaminophen (PERCOCET) 10-325 MG tablet Take 1 tablet by mouth every 4 (four) hours as needed for pain.    [provider]    Family History No family history on file.  Social History Social History  Substance Use Topics  . Smoking status: Former Research scientist (life sciences)  . Smokeless tobacco: Never Used  . Alcohol use No     Allergies   Macrodantin [nitrofurantoin]   Review of Systems Review of  Systems  Musculoskeletal:       LLE pain  Neurological: Positive for weakness.  All other systems reviewed and are negative.    Physical Exam Updated Vital Signs BP (!) 144/60   Pulse 80   Temp 98.2 F (36.8 C) (Oral)   Resp 16   Ht 5\' 11"  (1.803 m)   Wt 132.5 kg (292 lb)   SpO2 95%   BMI 40.73 kg/m   Physical Exam  Constitutional: She is oriented to person, place, and time. She appears well-developed and well-nourished.  HENT:  Head: Normocephalic and atraumatic.  Right Ear: External ear normal.  Left Ear: External ear normal.  Nose: Nose normal.  Mouth/Throat: Oropharynx is clear and moist.  Eyes: Pupils are equal, round, and reactive to light. Conjunctivae and EOM are normal.  Neck: Normal range of motion. Neck supple.  Cardiovascular: Normal rate, regular rhythm, normal heart sounds and intact distal pulses.   Pulmonary/Chest: Effort normal and breath sounds normal.  Abdominal: Soft. Bowel sounds are normal.  Genitourinary:  Genitourinary Comments: Indwelling foley  Musculoskeletal:       Legs: Neurological: She is alert and oriented to person, place, and time.  Skin: Skin is warm. Capillary refill takes less than 2 seconds.  Psychiatric: She has a normal mood and affect. Her behavior is normal. Judgment and thought content normal.  Nursing note and vitals reviewed.    ED Treatments / Results  Labs (all labs ordered are listed, but only abnormal results are displayed) Labs Reviewed  COMPREHENSIVE METABOLIC PANEL - Abnormal; Notable for the following:       Result Value   Chloride 99 (*)    Glucose, Bld 250 (*)    BUN 24 (*)    Creatinine, Ser 1.09 (*)    ALT 11 (*)    GFR calc non Af Amer 50 (*)    GFR calc Af Amer 58 (*)    All other components within normal limits  CBC WITH DIFFERENTIAL/PLATELET - Abnormal; Notable for the following:    Hemoglobin 11.8 (*)    Platelets 148 (*)    All other components within normal limits  URINE CULTURE    URINALYSIS, ROUTINE W REFLEX MICROSCOPIC  TROPONIN I    EKG  EKG Interpretation  Date/Time:  Thursday December 23 2016 16:55:22 EDT Ventricular Rate:  74 PR Interval:    QRS Duration: 115 QT Interval:  378 QTC Calculation: 420 R Axis:   -  40 Text Interpretation:  Sinus rhythm Nonspecific IVCD with LAD Probable inferior infarct, age indeterminate Abnormal lateral Q waves Anterior infarct, old No significant change since last tracing Confirmed by Isla Pence [phone]) on 12/23/2016 6:08:08 PM       Radiology Dg Chest 2 View  Result Date: 12/23/2016 CLINICAL DATA:  Short of breath, hypertension, former smoking history, recent fall EXAM: CHEST  2 VIEW COMPARISON:  None. FINDINGS: No active infiltrate or effusion is seen. There is no evidence of pneumothorax. Mediastinal and hilar contours are unremarkable, and moderate cardiomegaly is present. There are degenerative changes in the mid to lower thoracic spine but no compression deformity is seen. No rib fracture is seen. IMPRESSION: 1. Moderate cardiomegaly. 2. No active lung disease. 3. No evidence of fracture. Electronically Signed   By: Ivar Drape M.D.   On: 12/23/2016 16:06   Dg Tibia/fibula Left  Result Date: 12/23/2016 CLINICAL DATA:  [place] today with swelling and pain EXAM: LEFT TIBIA AND FIBULA - 2 VIEW COMPARISON:  None. FINDINGS: There is tricompartmental degenerative joint disease the left knee. No fracture is seen. There is chondrocalcinosis present which may indicate CPPD arthropathy. No acute fracture is noted involving the tibia or fibula. There is diffuse soft tissue swelling present. IMPRESSION: 1. No acute fracture.  Diffuse soft tissue swelling. 2. 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 3. 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 Electronically Signed   By: Ivar Drape M.D.   On: 12/23/2016 16:08    Procedures Procedures (including critical care time)  Medications  Ordered in ED Medications - No data to display   Initial Impression / Assessment and Plan / ED Course  I have reviewed the triage vital signs and the nursing notes.  Pertinent labs & imaging results that were available during my care of the patient were reviewed by me and considered in my medical decision making (see chart for details).    Pt's daughter said pt has had weakness that has been getting worse for the past month.  She took a leave of absence from work to take care of patient as she was no longer able to care for herself.  The pt does have a wheelchair and a walker and a cane.  They have plans to build a ramp, but have not done that yet.  I suspect the inactivity from lying in bed for 5 days caused her decline.  Pt has not had any pt and needs it.  I put a consult in for SW and a face to face eval for home pt and home health.  Daughter comfortable taking her home with that in place.  Pt is able to get up and walk with a walker.  Pt is stable for d/c.  Final Clinical Impressions(s) / ED Diagnoses   Final diagnoses:  Fall, initial encounter  Weakness  Diabetic polyneuropathy associated with type 2 diabetes mellitus ([place])    New Prescriptions New Prescriptions   No medications on file     Isla Pence, MD 12/23/16 1843

## 2016-12-25 LAB — URINE CULTURE: Culture: NO GROWTH

## 2016-12-27 DIAGNOSIS — M545 Low back pain: Secondary | ICD-10-CM | POA: Diagnosis not present

## 2016-12-27 DIAGNOSIS — R339 Retention of urine, unspecified: Secondary | ICD-10-CM | POA: Diagnosis not present

## 2016-12-27 DIAGNOSIS — E1142 Type 2 diabetes mellitus with diabetic polyneuropathy: Secondary | ICD-10-CM | POA: Diagnosis not present

## 2016-12-27 DIAGNOSIS — I10 Essential (primary) hypertension: Secondary | ICD-10-CM | POA: Diagnosis not present

## 2016-12-27 DIAGNOSIS — E1165 Type 2 diabetes mellitus with hyperglycemia: Secondary | ICD-10-CM | POA: Diagnosis not present

## 2016-12-28 ENCOUNTER — Other Ambulatory Visit: Payer: Self-pay | Admitting: *Deleted

## 2016-12-28 DIAGNOSIS — R338 Other retention of urine: Secondary | ICD-10-CM | POA: Diagnosis not present

## 2016-12-28 MED ORDER — DILTIAZEM HCL ER 120 MG PO CP12: 120.0000 mg | ORAL_CAPSULE | Freq: Two times a day (BID) | ORAL | 6 refills | Status: DC

## 2016-12-31 ENCOUNTER — Telehealth: Payer: Self-pay | Admitting: *Deleted

## 2016-12-31 DIAGNOSIS — M25559 Pain in unspecified hip: Secondary | ICD-10-CM | POA: Diagnosis not present

## 2016-12-31 DIAGNOSIS — E1142 Type 2 diabetes mellitus with diabetic polyneuropathy: Secondary | ICD-10-CM | POA: Diagnosis not present

## 2016-12-31 DIAGNOSIS — M47816 Spondylosis without myelopathy or radiculopathy, lumbar region: Secondary | ICD-10-CM | POA: Diagnosis not present

## 2016-12-31 DIAGNOSIS — Z79899 Other long term (current) drug therapy: Secondary | ICD-10-CM | POA: Diagnosis not present

## 2016-12-31 DIAGNOSIS — Z79891 Long term (current) use of opiate analgesic: Secondary | ICD-10-CM | POA: Diagnosis not present

## 2016-12-31 DIAGNOSIS — G894 Chronic pain syndrome: Secondary | ICD-10-CM | POA: Diagnosis not present

## 2016-12-31 MED ORDER — DILTIAZEM HCL 60 MG PO TABS: 120.0000 mg | ORAL_TABLET | Freq: Two times a day (BID) | ORAL | 3 refills | Status: DC

## 2016-12-31 NOTE — Telephone Encounter (Signed)
The records we had received at time of our first visit documented she was on long acting dilt 120mg  tid. Based on this I wanted to change to long acting 120mg  bid. Can daughter clarify the dose of the dilt and type she was on prior to our visit?   J Lisa Blakeman MD

## 2016-12-31 NOTE — Telephone Encounter (Signed)
Amy Corum (daughter) is questioning patient's Diltiazem dosage.  Is she supposed to be on the short acting or long acting Diltiazem.  Was recently changed & needs clarification.

## 2016-12-31 NOTE — Telephone Encounter (Signed)
The daughter thought she had been on the short acting Diltiazem.  It was 60mg  - 2 tabs TID.  That is why she called to clarify.  At some point, looks like was changed to the SR on the medication list.  Daughter pretty confidant that she had been on short acting in the past.

## 2016-12-31 NOTE — Telephone Encounter (Signed)
If tolerating the SR 120mg  bid without troubles I would continue, unless it is particularly more expensive then we could change back to the shorting acting.    Zandra Abts MD

## 2016-12-31 NOTE — Telephone Encounter (Signed)
Spoke with pharm at [facility] - will give patient Diltiazem (SA) 60mg  - 2 tabs BID.  Long acting on back order & does not have 120mg  short acting in stock.

## 2017-01-12 DIAGNOSIS — R001 Bradycardia, unspecified: Secondary | ICD-10-CM | POA: Diagnosis not present

## 2017-01-12 DIAGNOSIS — R0602 Shortness of breath: Secondary | ICD-10-CM | POA: Diagnosis not present

## 2017-01-12 DIAGNOSIS — I059 Rheumatic mitral valve disease, unspecified: Secondary | ICD-10-CM | POA: Diagnosis not present

## 2017-01-12 DIAGNOSIS — E1140 Type 2 diabetes mellitus with diabetic neuropathy, unspecified: Secondary | ICD-10-CM | POA: Diagnosis not present

## 2017-01-12 DIAGNOSIS — I10 Essential (primary) hypertension: Secondary | ICD-10-CM | POA: Diagnosis not present

## 2017-01-12 DIAGNOSIS — R002 Palpitations: Secondary | ICD-10-CM | POA: Diagnosis not present

## 2017-01-12 DIAGNOSIS — R339 Retention of urine, unspecified: Secondary | ICD-10-CM | POA: Diagnosis not present

## 2017-01-12 DIAGNOSIS — E1165 Type 2 diabetes mellitus with hyperglycemia: Secondary | ICD-10-CM | POA: Diagnosis not present

## 2017-01-12 DIAGNOSIS — Z881 Allergy status to other antibiotic agents status: Secondary | ICD-10-CM | POA: Diagnosis not present

## 2017-01-12 DIAGNOSIS — M5136 Other intervertebral disc degeneration, lumbar region: Secondary | ICD-10-CM | POA: Diagnosis not present

## 2017-01-12 DIAGNOSIS — R2689 Other abnormalities of gait and mobility: Secondary | ICD-10-CM | POA: Diagnosis not present

## 2017-01-12 DIAGNOSIS — M6281 Muscle weakness (generalized): Secondary | ICD-10-CM | POA: Diagnosis not present

## 2017-01-12 DIAGNOSIS — I517 Cardiomegaly: Secondary | ICD-10-CM | POA: Diagnosis not present

## 2017-01-12 DIAGNOSIS — R609 Edema, unspecified: Secondary | ICD-10-CM | POA: Diagnosis not present

## 2017-01-12 DIAGNOSIS — G8929 Other chronic pain: Secondary | ICD-10-CM | POA: Diagnosis not present

## 2017-01-12 DIAGNOSIS — R600 Localized edema: Secondary | ICD-10-CM | POA: Diagnosis not present

## 2017-01-12 DIAGNOSIS — Z8673 Personal history of transient ischemic attack (TIA), and cerebral infarction without residual deficits: Secondary | ICD-10-CM | POA: Diagnosis not present

## 2017-01-21 ENCOUNTER — Ambulatory Visit (INDEPENDENT_AMBULATORY_CARE_PROVIDER_SITE_OTHER): Payer: Medicare Other | Admitting: Cardiology

## 2017-01-21 ENCOUNTER — Encounter: Payer: Self-pay | Admitting: Cardiology

## 2017-01-21 VITALS — BP 128/70 | HR 87 | Ht 71.0 in | Wt 296.2 lb

## 2017-01-21 DIAGNOSIS — R001 Bradycardia, unspecified: Secondary | ICD-10-CM | POA: Diagnosis not present

## 2017-01-21 DIAGNOSIS — R002 Palpitations: Secondary | ICD-10-CM | POA: Diagnosis not present

## 2017-01-21 DIAGNOSIS — R600 Localized edema: Secondary | ICD-10-CM

## 2017-01-21 DIAGNOSIS — R0602 Shortness of breath: Secondary | ICD-10-CM | POA: Diagnosis not present

## 2017-01-21 MED ORDER — FUROSEMIDE 40 MG PO TABS: 60.0000 mg | ORAL_TABLET | Freq: Every day | ORAL | 3 refills | Status: DC

## 2017-01-21 NOTE — Progress Notes (Signed)
Clinical Summary Ms. Netherland is a 71 y.o.female seen today for follow up of the following medical problems.   1. Bradycardia - 11/2016 admission with UTI, junctinoal bradycardia in 30. She denies any specific symptoms. From discharge summary av nodal agents were held and bradycardia resolved. She was to take lower doses of both dilt and TOprol at discharge however appears she has continued to take the same dose she was on prior to admission  - no recent symptoms since last visit  2. Palpitations - seen by cardiology about 10 years ago in [place] per her report - I presume this is why she is on both Toprol and diltiazem  - since lowering dose of her av nodal agents no increase in palpitatoins.   3. LE edema - reports recent increaesd leg edema, some SOB.  Past Medical History:  Diagnosis Date  . Anxiety   . Cancer ([place])   . Depression   . Diabetes mellitus   . Dysrhythmia   . Fibromyalgia   . Guaiac positive stools   . Hypertension   . Neuromuscular disorder ([place])   . Reflux   . Ruptured lumbar disc    x2  . Shortness of breath   . Spinal stenosis      Allergies  Allergen Reactions  . Macrodantin [Nitrofurantoin]      Current Outpatient Prescriptions  Medication Sig Dispense Refill  . aspirin EC 81 MG tablet Take 81 mg by mouth daily.    . cholecalciferol (VITAMIN D) 1000 units tablet Take 1,000 Units by mouth daily.    Marland Kitchen diltiazem (CARDIZEM) 60 MG tablet Take 2 tablets (120 mg total) by mouth 2 (two) times daily. 360 tablet 3  . enalapril (VASOTEC) 10 MG tablet Take 5 mg by mouth.      . furosemide (LASIX) 40 MG tablet Take 40 mg by mouth.    . gabapentin (NEURONTIN) 600 MG tablet Take 600 mg by mouth 3 (three) times daily.    Marland Kitchen glyBURIDE (DIABETA) 5 MG tablet 5 mg daily with breakfast.     . insulin glargine (LANTUS) 100 UNIT/ML injection 20 Units 2 (two) times daily. 20 units in the morning and 25 units in the evening    . metFORMIN (GLUCOPHAGE) 1000 MG  tablet Take 1,000 mg by mouth.      . metoprolol succinate (TOPROL-XL) 50 MG 24 hr tablet Take 1 tablet (50 mg total) by mouth daily. Take with or immediately following a meal. 30 tablet 1  . oxyCODONE-acetaminophen (PERCOCET) 10-325 MG tablet Take 1 tablet by mouth every 4 (four) hours as needed for pain.     No current facility-administered medications for this visit.      Past Surgical History:  Procedure Laterality Date  . ABDOMINAL HYSTERECTOMY    . APPENDECTOMY    . CARPAL TUNNEL RELEASE Bilateral   . CHOLECYSTECTOMY    . COLONOSCOPY  10/21/2010   Procedure: COLONOSCOPY;  Surgeon: Rogene Houston, MD;  [facility];  Service: Endoscopy;  Laterality: N/A;  . COLONOSCOPY  10/21/2010  . ESOPHAGOGASTRODUODENOSCOPY  10/21/2010   Procedure: ESOPHAGOGASTRODUODENOSCOPY (EGD);  Surgeon: Rogene Houston, MD;  [facility];  Service: Endoscopy;  Laterality: N/A;  . EYE SURGERY    . TONSILLECTOMY  AGE 61     Allergies  Allergen Reactions  . Macrodantin [Nitrofurantoin]       No family history on file.   Social History Ms. Mittelstadt reports that she has quit  smoking. She has never used smokeless tobacco. Ms. Esters reports that she does not drink alcohol.   Review of Systems CONSTITUTIONAL: No weight loss, fever, chills, weakness or fatigue.  HEENT: Eyes: No visual loss, blurred vision, double vision or yellow sclerae.No hearing loss, sneezing, congestion, runny nose or sore throat.  SKIN: No rash or itching.  CARDIOVASCULAR: per hpi RESPIRATORY: No shortness of breath, cough or sputum.  GASTROINTESTINAL: No anorexia, nausea, vomiting or diarrhea. No abdominal pain or blood.  GENITOURINARY: No burning on urination, no polyuria NEUROLOGICAL: No headache, dizziness, syncope, paralysis, ataxia, numbness or tingling in the extremities. No change in bowel or bladder control.  MUSCULOSKELETAL: No muscle, back pain, joint pain or stiffness.  LYMPHATICS: No enlarged  nodes. No history of splenectomy.  PSYCHIATRIC: No history of depression or anxiety.  ENDOCRINOLOGIC: No reports of sweating, cold or heat intolerance. No polyuria or polydipsia.  Marland Kitchen   Physical Examination Vitals:   01/21/17 1346  BP: 128/70  Pulse: 87  SpO2: 96%   Vitals:   01/21/17 1346  Weight: 296 lb 3.2 oz (134.4 kg)  Height: 5\' 11"  (1.803 m)    Gen: resting comfortably, no acute distress HEENT: no scleral icterus, pupils equal round and reactive, no palptable cervical adenopathy,  CV: RRR, no m/r/g, no jvd Resp: Clear to auscultation bilaterally GI: abdomen is soft, non-tender, non-distended, normal bowel sounds, no hepatosplenomegaly MSK: extremities are warm, 1+ bilateral LE edema Skin: warm, no rash Neuro:  no focal deficits Psych: appropriate affect    Assessment and Plan   1. Bradycardia - jresolved with lower doses of av nodal agents - continue to monitor  2. LE edema - increase lasix to 60mg  daily.  - check BMET/Mg/TSH - obtain echo    Arnoldo Lenis, M.D.

## 2017-01-21 NOTE — Patient Instructions (Signed)
Your physician recommends that you schedule a follow-up appointment in: [place] has recommended you make the following change in your medication:   INCREASE LASIX 60 MG (1 AND 1/2 TABLETS DAILY)  Your physician recommends that you return for lab work in: [place] BMP/TSH/MG  Your physician has requested that you have an echocardiogram. Echocardiography is a painless test that uses sound waves to create images of your heart. It provides your doctor with information about the size and shape of your heart and how well your heart's chambers and valves are working. This procedure takes approximately one hour. There are no restrictions for this procedure.  Thank you for choosing [facility]!!

## 2017-01-27 DIAGNOSIS — I059 Rheumatic mitral valve disease, unspecified: Secondary | ICD-10-CM | POA: Diagnosis not present

## 2017-01-27 DIAGNOSIS — R0602 Shortness of breath: Secondary | ICD-10-CM | POA: Diagnosis not present

## 2017-01-27 DIAGNOSIS — R600 Localized edema: Secondary | ICD-10-CM | POA: Diagnosis not present

## 2017-01-27 DIAGNOSIS — I517 Cardiomegaly: Secondary | ICD-10-CM | POA: Diagnosis not present

## 2017-01-29 DIAGNOSIS — Z8673 Personal history of transient ischemic attack (TIA), and cerebral infarction without residual deficits: Secondary | ICD-10-CM | POA: Diagnosis not present

## 2017-01-29 DIAGNOSIS — M6281 Muscle weakness (generalized): Secondary | ICD-10-CM | POA: Diagnosis not present

## 2017-01-29 DIAGNOSIS — L89312 Pressure ulcer of right buttock, stage 2: Secondary | ICD-10-CM | POA: Diagnosis not present

## 2017-01-29 DIAGNOSIS — I10 Essential (primary) hypertension: Secondary | ICD-10-CM | POA: Diagnosis not present

## 2017-01-29 DIAGNOSIS — G8929 Other chronic pain: Secondary | ICD-10-CM | POA: Diagnosis not present

## 2017-01-29 DIAGNOSIS — Z7982 Long term (current) use of aspirin: Secondary | ICD-10-CM | POA: Diagnosis not present

## 2017-01-29 DIAGNOSIS — E1140 Type 2 diabetes mellitus with diabetic neuropathy, unspecified: Secondary | ICD-10-CM | POA: Diagnosis not present

## 2017-01-29 DIAGNOSIS — R609 Edema, unspecified: Secondary | ICD-10-CM | POA: Diagnosis not present

## 2017-01-29 DIAGNOSIS — Z794 Long term (current) use of insulin: Secondary | ICD-10-CM | POA: Diagnosis not present

## 2017-01-29 DIAGNOSIS — M1991 Primary osteoarthritis, unspecified site: Secondary | ICD-10-CM | POA: Diagnosis not present

## 2017-01-29 DIAGNOSIS — Z4800 Encounter for change or removal of nonsurgical wound dressing: Secondary | ICD-10-CM | POA: Diagnosis not present

## 2017-01-29 DIAGNOSIS — S81802D Unspecified open wound, left lower leg, subsequent encounter: Secondary | ICD-10-CM | POA: Diagnosis not present

## 2017-01-31 DIAGNOSIS — I10 Essential (primary) hypertension: Secondary | ICD-10-CM | POA: Diagnosis not present

## 2017-01-31 DIAGNOSIS — Z794 Long term (current) use of insulin: Secondary | ICD-10-CM | POA: Diagnosis not present

## 2017-01-31 DIAGNOSIS — R609 Edema, unspecified: Secondary | ICD-10-CM | POA: Diagnosis not present

## 2017-01-31 DIAGNOSIS — Z4800 Encounter for change or removal of nonsurgical wound dressing: Secondary | ICD-10-CM | POA: Diagnosis not present

## 2017-01-31 DIAGNOSIS — L89312 Pressure ulcer of right buttock, stage 2: Secondary | ICD-10-CM | POA: Diagnosis not present

## 2017-01-31 DIAGNOSIS — M6281 Muscle weakness (generalized): Secondary | ICD-10-CM | POA: Diagnosis not present

## 2017-01-31 DIAGNOSIS — Z8673 Personal history of transient ischemic attack (TIA), and cerebral infarction without residual deficits: Secondary | ICD-10-CM | POA: Diagnosis not present

## 2017-01-31 DIAGNOSIS — S81802D Unspecified open wound, left lower leg, subsequent encounter: Secondary | ICD-10-CM | POA: Diagnosis not present

## 2017-01-31 DIAGNOSIS — Z7982 Long term (current) use of aspirin: Secondary | ICD-10-CM | POA: Diagnosis not present

## 2017-01-31 DIAGNOSIS — G8929 Other chronic pain: Secondary | ICD-10-CM | POA: Diagnosis not present

## 2017-01-31 DIAGNOSIS — E1140 Type 2 diabetes mellitus with diabetic neuropathy, unspecified: Secondary | ICD-10-CM | POA: Diagnosis not present

## 2017-01-31 DIAGNOSIS — M1991 Primary osteoarthritis, unspecified site: Secondary | ICD-10-CM | POA: Diagnosis not present

## 2017-02-01 DIAGNOSIS — S81802D Unspecified open wound, left lower leg, subsequent encounter: Secondary | ICD-10-CM | POA: Diagnosis not present

## 2017-02-01 DIAGNOSIS — Z7982 Long term (current) use of aspirin: Secondary | ICD-10-CM | POA: Diagnosis not present

## 2017-02-01 DIAGNOSIS — Z8673 Personal history of transient ischemic attack (TIA), and cerebral infarction without residual deficits: Secondary | ICD-10-CM | POA: Diagnosis not present

## 2017-02-01 DIAGNOSIS — E1140 Type 2 diabetes mellitus with diabetic neuropathy, unspecified: Secondary | ICD-10-CM | POA: Diagnosis not present

## 2017-02-01 DIAGNOSIS — G8929 Other chronic pain: Secondary | ICD-10-CM | POA: Diagnosis not present

## 2017-02-01 DIAGNOSIS — Z4800 Encounter for change or removal of nonsurgical wound dressing: Secondary | ICD-10-CM | POA: Diagnosis not present

## 2017-02-01 DIAGNOSIS — M1991 Primary osteoarthritis, unspecified site: Secondary | ICD-10-CM | POA: Diagnosis not present

## 2017-02-01 DIAGNOSIS — M6281 Muscle weakness (generalized): Secondary | ICD-10-CM | POA: Diagnosis not present

## 2017-02-01 DIAGNOSIS — Z794 Long term (current) use of insulin: Secondary | ICD-10-CM | POA: Diagnosis not present

## 2017-02-01 DIAGNOSIS — L89312 Pressure ulcer of right buttock, stage 2: Secondary | ICD-10-CM | POA: Diagnosis not present

## 2017-02-01 DIAGNOSIS — R609 Edema, unspecified: Secondary | ICD-10-CM | POA: Diagnosis not present

## 2017-02-01 DIAGNOSIS — I10 Essential (primary) hypertension: Secondary | ICD-10-CM | POA: Diagnosis not present

## 2017-02-02 DIAGNOSIS — M1991 Primary osteoarthritis, unspecified site: Secondary | ICD-10-CM | POA: Diagnosis not present

## 2017-02-02 DIAGNOSIS — Z8673 Personal history of transient ischemic attack (TIA), and cerebral infarction without residual deficits: Secondary | ICD-10-CM | POA: Diagnosis not present

## 2017-02-02 DIAGNOSIS — Z4800 Encounter for change or removal of nonsurgical wound dressing: Secondary | ICD-10-CM | POA: Diagnosis not present

## 2017-02-02 DIAGNOSIS — R609 Edema, unspecified: Secondary | ICD-10-CM | POA: Diagnosis not present

## 2017-02-02 DIAGNOSIS — Z794 Long term (current) use of insulin: Secondary | ICD-10-CM | POA: Diagnosis not present

## 2017-02-02 DIAGNOSIS — M6281 Muscle weakness (generalized): Secondary | ICD-10-CM | POA: Diagnosis not present

## 2017-02-02 DIAGNOSIS — S81802D Unspecified open wound, left lower leg, subsequent encounter: Secondary | ICD-10-CM | POA: Diagnosis not present

## 2017-02-02 DIAGNOSIS — Z7982 Long term (current) use of aspirin: Secondary | ICD-10-CM | POA: Diagnosis not present

## 2017-02-02 DIAGNOSIS — E1140 Type 2 diabetes mellitus with diabetic neuropathy, unspecified: Secondary | ICD-10-CM | POA: Diagnosis not present

## 2017-02-02 DIAGNOSIS — I10 Essential (primary) hypertension: Secondary | ICD-10-CM | POA: Diagnosis not present

## 2017-02-02 DIAGNOSIS — L89312 Pressure ulcer of right buttock, stage 2: Secondary | ICD-10-CM | POA: Diagnosis not present

## 2017-02-02 DIAGNOSIS — G8929 Other chronic pain: Secondary | ICD-10-CM | POA: Diagnosis not present

## 2017-02-03 DIAGNOSIS — E1142 Type 2 diabetes mellitus with diabetic polyneuropathy: Secondary | ICD-10-CM | POA: Diagnosis not present

## 2017-02-03 DIAGNOSIS — M25559 Pain in unspecified hip: Secondary | ICD-10-CM | POA: Diagnosis not present

## 2017-02-03 DIAGNOSIS — M47816 Spondylosis without myelopathy or radiculopathy, lumbar region: Secondary | ICD-10-CM | POA: Diagnosis not present

## 2017-02-03 DIAGNOSIS — G894 Chronic pain syndrome: Secondary | ICD-10-CM | POA: Diagnosis not present

## 2017-02-03 DIAGNOSIS — Z79899 Other long term (current) drug therapy: Secondary | ICD-10-CM | POA: Diagnosis not present

## 2017-02-03 DIAGNOSIS — Z79891 Long term (current) use of opiate analgesic: Secondary | ICD-10-CM | POA: Diagnosis not present

## 2017-02-04 DIAGNOSIS — R609 Edema, unspecified: Secondary | ICD-10-CM | POA: Diagnosis not present

## 2017-02-04 DIAGNOSIS — M6281 Muscle weakness (generalized): Secondary | ICD-10-CM | POA: Diagnosis not present

## 2017-02-04 DIAGNOSIS — S81802D Unspecified open wound, left lower leg, subsequent encounter: Secondary | ICD-10-CM | POA: Diagnosis not present

## 2017-02-04 DIAGNOSIS — E118 Type 2 diabetes mellitus with unspecified complications: Secondary | ICD-10-CM | POA: Diagnosis not present

## 2017-02-04 DIAGNOSIS — Z794 Long term (current) use of insulin: Secondary | ICD-10-CM | POA: Diagnosis not present

## 2017-02-04 DIAGNOSIS — G8929 Other chronic pain: Secondary | ICD-10-CM | POA: Diagnosis not present

## 2017-02-04 DIAGNOSIS — L89312 Pressure ulcer of right buttock, stage 2: Secondary | ICD-10-CM | POA: Diagnosis not present

## 2017-02-04 DIAGNOSIS — E1140 Type 2 diabetes mellitus with diabetic neuropathy, unspecified: Secondary | ICD-10-CM | POA: Diagnosis not present

## 2017-02-04 DIAGNOSIS — Z7982 Long term (current) use of aspirin: Secondary | ICD-10-CM | POA: Diagnosis not present

## 2017-02-04 DIAGNOSIS — Z4800 Encounter for change or removal of nonsurgical wound dressing: Secondary | ICD-10-CM | POA: Diagnosis not present

## 2017-02-04 DIAGNOSIS — M1991 Primary osteoarthritis, unspecified site: Secondary | ICD-10-CM | POA: Diagnosis not present

## 2017-02-04 DIAGNOSIS — Z8673 Personal history of transient ischemic attack (TIA), and cerebral infarction without residual deficits: Secondary | ICD-10-CM | POA: Diagnosis not present

## 2017-02-04 DIAGNOSIS — I10 Essential (primary) hypertension: Secondary | ICD-10-CM | POA: Diagnosis not present

## 2017-02-07 DIAGNOSIS — S81802D Unspecified open wound, left lower leg, subsequent encounter: Secondary | ICD-10-CM | POA: Diagnosis not present

## 2017-02-07 DIAGNOSIS — Z4800 Encounter for change or removal of nonsurgical wound dressing: Secondary | ICD-10-CM | POA: Diagnosis not present

## 2017-02-07 DIAGNOSIS — I10 Essential (primary) hypertension: Secondary | ICD-10-CM | POA: Diagnosis not present

## 2017-02-07 DIAGNOSIS — M6281 Muscle weakness (generalized): Secondary | ICD-10-CM | POA: Diagnosis not present

## 2017-02-07 DIAGNOSIS — R609 Edema, unspecified: Secondary | ICD-10-CM | POA: Diagnosis not present

## 2017-02-07 DIAGNOSIS — E1140 Type 2 diabetes mellitus with diabetic neuropathy, unspecified: Secondary | ICD-10-CM | POA: Diagnosis not present

## 2017-02-07 DIAGNOSIS — L89312 Pressure ulcer of right buttock, stage 2: Secondary | ICD-10-CM | POA: Diagnosis not present

## 2017-02-07 DIAGNOSIS — G8929 Other chronic pain: Secondary | ICD-10-CM | POA: Diagnosis not present

## 2017-02-07 DIAGNOSIS — M1991 Primary osteoarthritis, unspecified site: Secondary | ICD-10-CM | POA: Diagnosis not present

## 2017-02-07 DIAGNOSIS — Z8673 Personal history of transient ischemic attack (TIA), and cerebral infarction without residual deficits: Secondary | ICD-10-CM | POA: Diagnosis not present

## 2017-02-07 DIAGNOSIS — Z794 Long term (current) use of insulin: Secondary | ICD-10-CM | POA: Diagnosis not present

## 2017-02-07 DIAGNOSIS — Z7982 Long term (current) use of aspirin: Secondary | ICD-10-CM | POA: Diagnosis not present

## 2017-02-08 DIAGNOSIS — R609 Edema, unspecified: Secondary | ICD-10-CM | POA: Diagnosis not present

## 2017-02-08 DIAGNOSIS — Z7982 Long term (current) use of aspirin: Secondary | ICD-10-CM | POA: Diagnosis not present

## 2017-02-08 DIAGNOSIS — M6281 Muscle weakness (generalized): Secondary | ICD-10-CM | POA: Diagnosis not present

## 2017-02-08 DIAGNOSIS — S81802D Unspecified open wound, left lower leg, subsequent encounter: Secondary | ICD-10-CM | POA: Diagnosis not present

## 2017-02-08 DIAGNOSIS — L89312 Pressure ulcer of right buttock, stage 2: Secondary | ICD-10-CM | POA: Diagnosis not present

## 2017-02-08 DIAGNOSIS — M1991 Primary osteoarthritis, unspecified site: Secondary | ICD-10-CM | POA: Diagnosis not present

## 2017-02-08 DIAGNOSIS — I10 Essential (primary) hypertension: Secondary | ICD-10-CM | POA: Diagnosis not present

## 2017-02-08 DIAGNOSIS — Z794 Long term (current) use of insulin: Secondary | ICD-10-CM | POA: Diagnosis not present

## 2017-02-08 DIAGNOSIS — G8929 Other chronic pain: Secondary | ICD-10-CM | POA: Diagnosis not present

## 2017-02-08 DIAGNOSIS — Z4800 Encounter for change or removal of nonsurgical wound dressing: Secondary | ICD-10-CM | POA: Diagnosis not present

## 2017-02-08 DIAGNOSIS — Z8673 Personal history of transient ischemic attack (TIA), and cerebral infarction without residual deficits: Secondary | ICD-10-CM | POA: Diagnosis not present

## 2017-02-08 DIAGNOSIS — E1140 Type 2 diabetes mellitus with diabetic neuropathy, unspecified: Secondary | ICD-10-CM | POA: Diagnosis not present

## 2017-02-09 ENCOUNTER — Ambulatory Visit (INDEPENDENT_AMBULATORY_CARE_PROVIDER_SITE_OTHER): Payer: Medicare Other | Admitting: Ophthalmology

## 2017-02-09 DIAGNOSIS — I10 Essential (primary) hypertension: Secondary | ICD-10-CM | POA: Diagnosis not present

## 2017-02-09 DIAGNOSIS — E11319 Type 2 diabetes mellitus with unspecified diabetic retinopathy without macular edema: Secondary | ICD-10-CM | POA: Diagnosis not present

## 2017-02-09 DIAGNOSIS — H35033 Hypertensive retinopathy, bilateral: Secondary | ICD-10-CM | POA: Diagnosis not present

## 2017-02-09 DIAGNOSIS — E113593 Type 2 diabetes mellitus with proliferative diabetic retinopathy without macular edema, bilateral: Secondary | ICD-10-CM | POA: Diagnosis not present

## 2017-02-09 DIAGNOSIS — H43813 Vitreous degeneration, bilateral: Secondary | ICD-10-CM | POA: Diagnosis not present

## 2017-02-10 DIAGNOSIS — R609 Edema, unspecified: Secondary | ICD-10-CM | POA: Diagnosis not present

## 2017-02-10 DIAGNOSIS — E1140 Type 2 diabetes mellitus with diabetic neuropathy, unspecified: Secondary | ICD-10-CM | POA: Diagnosis not present

## 2017-02-10 DIAGNOSIS — M1991 Primary osteoarthritis, unspecified site: Secondary | ICD-10-CM | POA: Diagnosis not present

## 2017-02-10 DIAGNOSIS — G8929 Other chronic pain: Secondary | ICD-10-CM | POA: Diagnosis not present

## 2017-02-10 DIAGNOSIS — M6281 Muscle weakness (generalized): Secondary | ICD-10-CM | POA: Diagnosis not present

## 2017-02-10 DIAGNOSIS — L89312 Pressure ulcer of right buttock, stage 2: Secondary | ICD-10-CM | POA: Diagnosis not present

## 2017-02-10 DIAGNOSIS — Z4800 Encounter for change or removal of nonsurgical wound dressing: Secondary | ICD-10-CM | POA: Diagnosis not present

## 2017-02-10 DIAGNOSIS — Z794 Long term (current) use of insulin: Secondary | ICD-10-CM | POA: Diagnosis not present

## 2017-02-10 DIAGNOSIS — S81802D Unspecified open wound, left lower leg, subsequent encounter: Secondary | ICD-10-CM | POA: Diagnosis not present

## 2017-02-10 DIAGNOSIS — Z7982 Long term (current) use of aspirin: Secondary | ICD-10-CM | POA: Diagnosis not present

## 2017-02-10 DIAGNOSIS — I10 Essential (primary) hypertension: Secondary | ICD-10-CM | POA: Diagnosis not present

## 2017-02-10 DIAGNOSIS — Z8673 Personal history of transient ischemic attack (TIA), and cerebral infarction without residual deficits: Secondary | ICD-10-CM | POA: Diagnosis not present

## 2017-02-11 ENCOUNTER — Encounter: Payer: Self-pay | Admitting: *Deleted

## 2017-02-14 DIAGNOSIS — M1991 Primary osteoarthritis, unspecified site: Secondary | ICD-10-CM | POA: Diagnosis not present

## 2017-02-14 DIAGNOSIS — Z794 Long term (current) use of insulin: Secondary | ICD-10-CM | POA: Diagnosis not present

## 2017-02-14 DIAGNOSIS — I10 Essential (primary) hypertension: Secondary | ICD-10-CM | POA: Diagnosis not present

## 2017-02-14 DIAGNOSIS — Z4800 Encounter for change or removal of nonsurgical wound dressing: Secondary | ICD-10-CM | POA: Diagnosis not present

## 2017-02-14 DIAGNOSIS — S81802D Unspecified open wound, left lower leg, subsequent encounter: Secondary | ICD-10-CM | POA: Diagnosis not present

## 2017-02-14 DIAGNOSIS — E1140 Type 2 diabetes mellitus with diabetic neuropathy, unspecified: Secondary | ICD-10-CM | POA: Diagnosis not present

## 2017-02-14 DIAGNOSIS — R609 Edema, unspecified: Secondary | ICD-10-CM | POA: Diagnosis not present

## 2017-02-14 DIAGNOSIS — L89312 Pressure ulcer of right buttock, stage 2: Secondary | ICD-10-CM | POA: Diagnosis not present

## 2017-02-14 DIAGNOSIS — G8929 Other chronic pain: Secondary | ICD-10-CM | POA: Diagnosis not present

## 2017-02-14 DIAGNOSIS — Z7982 Long term (current) use of aspirin: Secondary | ICD-10-CM | POA: Diagnosis not present

## 2017-02-14 DIAGNOSIS — Z8673 Personal history of transient ischemic attack (TIA), and cerebral infarction without residual deficits: Secondary | ICD-10-CM | POA: Diagnosis not present

## 2017-02-14 DIAGNOSIS — M6281 Muscle weakness (generalized): Secondary | ICD-10-CM | POA: Diagnosis not present

## 2017-02-15 DIAGNOSIS — Z794 Long term (current) use of insulin: Secondary | ICD-10-CM | POA: Diagnosis not present

## 2017-02-15 DIAGNOSIS — G8929 Other chronic pain: Secondary | ICD-10-CM | POA: Diagnosis not present

## 2017-02-15 DIAGNOSIS — L89312 Pressure ulcer of right buttock, stage 2: Secondary | ICD-10-CM | POA: Diagnosis not present

## 2017-02-15 DIAGNOSIS — E1140 Type 2 diabetes mellitus with diabetic neuropathy, unspecified: Secondary | ICD-10-CM | POA: Diagnosis not present

## 2017-02-15 DIAGNOSIS — Z8673 Personal history of transient ischemic attack (TIA), and cerebral infarction without residual deficits: Secondary | ICD-10-CM | POA: Diagnosis not present

## 2017-02-15 DIAGNOSIS — I10 Essential (primary) hypertension: Secondary | ICD-10-CM | POA: Diagnosis not present

## 2017-02-15 DIAGNOSIS — Z7982 Long term (current) use of aspirin: Secondary | ICD-10-CM | POA: Diagnosis not present

## 2017-02-15 DIAGNOSIS — M1991 Primary osteoarthritis, unspecified site: Secondary | ICD-10-CM | POA: Diagnosis not present

## 2017-02-15 DIAGNOSIS — S81802D Unspecified open wound, left lower leg, subsequent encounter: Secondary | ICD-10-CM | POA: Diagnosis not present

## 2017-02-15 DIAGNOSIS — R609 Edema, unspecified: Secondary | ICD-10-CM | POA: Diagnosis not present

## 2017-02-15 DIAGNOSIS — Z4800 Encounter for change or removal of nonsurgical wound dressing: Secondary | ICD-10-CM | POA: Diagnosis not present

## 2017-02-15 DIAGNOSIS — M6281 Muscle weakness (generalized): Secondary | ICD-10-CM | POA: Diagnosis not present

## 2017-02-16 DIAGNOSIS — M1991 Primary osteoarthritis, unspecified site: Secondary | ICD-10-CM | POA: Diagnosis not present

## 2017-02-16 DIAGNOSIS — S81802D Unspecified open wound, left lower leg, subsequent encounter: Secondary | ICD-10-CM | POA: Diagnosis not present

## 2017-02-16 DIAGNOSIS — L89312 Pressure ulcer of right buttock, stage 2: Secondary | ICD-10-CM | POA: Diagnosis not present

## 2017-02-16 DIAGNOSIS — E1140 Type 2 diabetes mellitus with diabetic neuropathy, unspecified: Secondary | ICD-10-CM | POA: Diagnosis not present

## 2017-02-16 DIAGNOSIS — Z794 Long term (current) use of insulin: Secondary | ICD-10-CM | POA: Diagnosis not present

## 2017-02-16 DIAGNOSIS — Z8673 Personal history of transient ischemic attack (TIA), and cerebral infarction without residual deficits: Secondary | ICD-10-CM | POA: Diagnosis not present

## 2017-02-16 DIAGNOSIS — Z4800 Encounter for change or removal of nonsurgical wound dressing: Secondary | ICD-10-CM | POA: Diagnosis not present

## 2017-02-16 DIAGNOSIS — Z7982 Long term (current) use of aspirin: Secondary | ICD-10-CM | POA: Diagnosis not present

## 2017-02-16 DIAGNOSIS — R609 Edema, unspecified: Secondary | ICD-10-CM | POA: Diagnosis not present

## 2017-02-16 DIAGNOSIS — M6281 Muscle weakness (generalized): Secondary | ICD-10-CM | POA: Diagnosis not present

## 2017-02-16 DIAGNOSIS — R339 Retention of urine, unspecified: Secondary | ICD-10-CM | POA: Diagnosis not present

## 2017-02-16 DIAGNOSIS — G8929 Other chronic pain: Secondary | ICD-10-CM | POA: Diagnosis not present

## 2017-02-16 DIAGNOSIS — N390 Urinary tract infection, site not specified: Secondary | ICD-10-CM | POA: Diagnosis not present

## 2017-02-16 DIAGNOSIS — I10 Essential (primary) hypertension: Secondary | ICD-10-CM | POA: Diagnosis not present

## 2017-02-17 DIAGNOSIS — R339 Retention of urine, unspecified: Secondary | ICD-10-CM | POA: Diagnosis not present

## 2017-02-17 DIAGNOSIS — L89312 Pressure ulcer of right buttock, stage 2: Secondary | ICD-10-CM | POA: Diagnosis not present

## 2017-02-17 DIAGNOSIS — N390 Urinary tract infection, site not specified: Secondary | ICD-10-CM | POA: Diagnosis not present

## 2017-02-17 DIAGNOSIS — S81802D Unspecified open wound, left lower leg, subsequent encounter: Secondary | ICD-10-CM | POA: Diagnosis not present

## 2017-02-21 DIAGNOSIS — R609 Edema, unspecified: Secondary | ICD-10-CM | POA: Diagnosis not present

## 2017-02-21 DIAGNOSIS — N390 Urinary tract infection, site not specified: Secondary | ICD-10-CM | POA: Diagnosis not present

## 2017-02-21 DIAGNOSIS — Z7982 Long term (current) use of aspirin: Secondary | ICD-10-CM | POA: Diagnosis not present

## 2017-02-21 DIAGNOSIS — I10 Essential (primary) hypertension: Secondary | ICD-10-CM | POA: Diagnosis not present

## 2017-02-21 DIAGNOSIS — E1140 Type 2 diabetes mellitus with diabetic neuropathy, unspecified: Secondary | ICD-10-CM | POA: Diagnosis not present

## 2017-02-21 DIAGNOSIS — I482 Chronic atrial fibrillation: Secondary | ICD-10-CM | POA: Diagnosis not present

## 2017-02-21 DIAGNOSIS — L89312 Pressure ulcer of right buttock, stage 2: Secondary | ICD-10-CM | POA: Diagnosis not present

## 2017-02-21 DIAGNOSIS — Z8673 Personal history of transient ischemic attack (TIA), and cerebral infarction without residual deficits: Secondary | ICD-10-CM | POA: Diagnosis not present

## 2017-02-21 DIAGNOSIS — Z794 Long term (current) use of insulin: Secondary | ICD-10-CM | POA: Diagnosis not present

## 2017-02-21 DIAGNOSIS — M6281 Muscle weakness (generalized): Secondary | ICD-10-CM | POA: Diagnosis not present

## 2017-02-21 DIAGNOSIS — M1991 Primary osteoarthritis, unspecified site: Secondary | ICD-10-CM | POA: Diagnosis not present

## 2017-02-21 DIAGNOSIS — R339 Retention of urine, unspecified: Secondary | ICD-10-CM | POA: Diagnosis not present

## 2017-02-21 DIAGNOSIS — Z4800 Encounter for change or removal of nonsurgical wound dressing: Secondary | ICD-10-CM | POA: Diagnosis not present

## 2017-02-21 DIAGNOSIS — G8929 Other chronic pain: Secondary | ICD-10-CM | POA: Diagnosis not present

## 2017-02-21 DIAGNOSIS — S81802D Unspecified open wound, left lower leg, subsequent encounter: Secondary | ICD-10-CM | POA: Diagnosis not present

## 2017-02-22 DIAGNOSIS — M6281 Muscle weakness (generalized): Secondary | ICD-10-CM | POA: Diagnosis not present

## 2017-02-22 DIAGNOSIS — L89312 Pressure ulcer of right buttock, stage 2: Secondary | ICD-10-CM | POA: Diagnosis not present

## 2017-02-22 DIAGNOSIS — E1140 Type 2 diabetes mellitus with diabetic neuropathy, unspecified: Secondary | ICD-10-CM | POA: Diagnosis not present

## 2017-02-22 DIAGNOSIS — Z794 Long term (current) use of insulin: Secondary | ICD-10-CM | POA: Diagnosis not present

## 2017-02-22 DIAGNOSIS — I10 Essential (primary) hypertension: Secondary | ICD-10-CM | POA: Diagnosis not present

## 2017-02-22 DIAGNOSIS — G8929 Other chronic pain: Secondary | ICD-10-CM | POA: Diagnosis not present

## 2017-02-22 DIAGNOSIS — R609 Edema, unspecified: Secondary | ICD-10-CM | POA: Diagnosis not present

## 2017-02-22 DIAGNOSIS — S81802D Unspecified open wound, left lower leg, subsequent encounter: Secondary | ICD-10-CM | POA: Diagnosis not present

## 2017-02-22 DIAGNOSIS — Z4800 Encounter for change or removal of nonsurgical wound dressing: Secondary | ICD-10-CM | POA: Diagnosis not present

## 2017-02-22 DIAGNOSIS — M1991 Primary osteoarthritis, unspecified site: Secondary | ICD-10-CM | POA: Diagnosis not present

## 2017-02-22 DIAGNOSIS — Z8673 Personal history of transient ischemic attack (TIA), and cerebral infarction without residual deficits: Secondary | ICD-10-CM | POA: Diagnosis not present

## 2017-02-22 DIAGNOSIS — Z7982 Long term (current) use of aspirin: Secondary | ICD-10-CM | POA: Diagnosis not present

## 2017-02-23 DIAGNOSIS — E1140 Type 2 diabetes mellitus with diabetic neuropathy, unspecified: Secondary | ICD-10-CM | POA: Diagnosis not present

## 2017-02-23 DIAGNOSIS — Z7982 Long term (current) use of aspirin: Secondary | ICD-10-CM | POA: Diagnosis not present

## 2017-02-23 DIAGNOSIS — S81802D Unspecified open wound, left lower leg, subsequent encounter: Secondary | ICD-10-CM | POA: Diagnosis not present

## 2017-02-23 DIAGNOSIS — Z794 Long term (current) use of insulin: Secondary | ICD-10-CM | POA: Diagnosis not present

## 2017-02-23 DIAGNOSIS — G8929 Other chronic pain: Secondary | ICD-10-CM | POA: Diagnosis not present

## 2017-02-23 DIAGNOSIS — Z4800 Encounter for change or removal of nonsurgical wound dressing: Secondary | ICD-10-CM | POA: Diagnosis not present

## 2017-02-23 DIAGNOSIS — L89312 Pressure ulcer of right buttock, stage 2: Secondary | ICD-10-CM | POA: Diagnosis not present

## 2017-02-23 DIAGNOSIS — Z8673 Personal history of transient ischemic attack (TIA), and cerebral infarction without residual deficits: Secondary | ICD-10-CM | POA: Diagnosis not present

## 2017-02-23 DIAGNOSIS — M6281 Muscle weakness (generalized): Secondary | ICD-10-CM | POA: Diagnosis not present

## 2017-02-23 DIAGNOSIS — I10 Essential (primary) hypertension: Secondary | ICD-10-CM | POA: Diagnosis not present

## 2017-02-23 DIAGNOSIS — R609 Edema, unspecified: Secondary | ICD-10-CM | POA: Diagnosis not present

## 2017-02-23 DIAGNOSIS — M1991 Primary osteoarthritis, unspecified site: Secondary | ICD-10-CM | POA: Diagnosis not present

## 2017-02-28 DIAGNOSIS — Z7982 Long term (current) use of aspirin: Secondary | ICD-10-CM | POA: Diagnosis not present

## 2017-02-28 DIAGNOSIS — M1991 Primary osteoarthritis, unspecified site: Secondary | ICD-10-CM | POA: Diagnosis not present

## 2017-02-28 DIAGNOSIS — Z4800 Encounter for change or removal of nonsurgical wound dressing: Secondary | ICD-10-CM | POA: Diagnosis not present

## 2017-02-28 DIAGNOSIS — S81802D Unspecified open wound, left lower leg, subsequent encounter: Secondary | ICD-10-CM | POA: Diagnosis not present

## 2017-02-28 DIAGNOSIS — L89312 Pressure ulcer of right buttock, stage 2: Secondary | ICD-10-CM | POA: Diagnosis not present

## 2017-02-28 DIAGNOSIS — Z794 Long term (current) use of insulin: Secondary | ICD-10-CM | POA: Diagnosis not present

## 2017-02-28 DIAGNOSIS — G8929 Other chronic pain: Secondary | ICD-10-CM | POA: Diagnosis not present

## 2017-02-28 DIAGNOSIS — E1140 Type 2 diabetes mellitus with diabetic neuropathy, unspecified: Secondary | ICD-10-CM | POA: Diagnosis not present

## 2017-02-28 DIAGNOSIS — I10 Essential (primary) hypertension: Secondary | ICD-10-CM | POA: Diagnosis not present

## 2017-02-28 DIAGNOSIS — R609 Edema, unspecified: Secondary | ICD-10-CM | POA: Diagnosis not present

## 2017-02-28 DIAGNOSIS — M6281 Muscle weakness (generalized): Secondary | ICD-10-CM | POA: Diagnosis not present

## 2017-02-28 DIAGNOSIS — Z8673 Personal history of transient ischemic attack (TIA), and cerebral infarction without residual deficits: Secondary | ICD-10-CM | POA: Diagnosis not present

## 2017-03-01 DIAGNOSIS — E1140 Type 2 diabetes mellitus with diabetic neuropathy, unspecified: Secondary | ICD-10-CM | POA: Diagnosis not present

## 2017-03-01 DIAGNOSIS — R609 Edema, unspecified: Secondary | ICD-10-CM | POA: Diagnosis not present

## 2017-03-01 DIAGNOSIS — Z7982 Long term (current) use of aspirin: Secondary | ICD-10-CM | POA: Diagnosis not present

## 2017-03-01 DIAGNOSIS — Z8673 Personal history of transient ischemic attack (TIA), and cerebral infarction without residual deficits: Secondary | ICD-10-CM | POA: Diagnosis not present

## 2017-03-01 DIAGNOSIS — Z4800 Encounter for change or removal of nonsurgical wound dressing: Secondary | ICD-10-CM | POA: Diagnosis not present

## 2017-03-01 DIAGNOSIS — M6281 Muscle weakness (generalized): Secondary | ICD-10-CM | POA: Diagnosis not present

## 2017-03-01 DIAGNOSIS — G8929 Other chronic pain: Secondary | ICD-10-CM | POA: Diagnosis not present

## 2017-03-01 DIAGNOSIS — Z794 Long term (current) use of insulin: Secondary | ICD-10-CM | POA: Diagnosis not present

## 2017-03-01 DIAGNOSIS — S81802D Unspecified open wound, left lower leg, subsequent encounter: Secondary | ICD-10-CM | POA: Diagnosis not present

## 2017-03-01 DIAGNOSIS — M1991 Primary osteoarthritis, unspecified site: Secondary | ICD-10-CM | POA: Diagnosis not present

## 2017-03-01 DIAGNOSIS — L89312 Pressure ulcer of right buttock, stage 2: Secondary | ICD-10-CM | POA: Diagnosis not present

## 2017-03-01 DIAGNOSIS — I10 Essential (primary) hypertension: Secondary | ICD-10-CM | POA: Diagnosis not present

## 2017-03-02 DIAGNOSIS — I10 Essential (primary) hypertension: Secondary | ICD-10-CM | POA: Diagnosis not present

## 2017-03-02 DIAGNOSIS — Z794 Long term (current) use of insulin: Secondary | ICD-10-CM | POA: Diagnosis not present

## 2017-03-02 DIAGNOSIS — L89312 Pressure ulcer of right buttock, stage 2: Secondary | ICD-10-CM | POA: Diagnosis not present

## 2017-03-02 DIAGNOSIS — M6281 Muscle weakness (generalized): Secondary | ICD-10-CM | POA: Diagnosis not present

## 2017-03-02 DIAGNOSIS — Z4800 Encounter for change or removal of nonsurgical wound dressing: Secondary | ICD-10-CM | POA: Diagnosis not present

## 2017-03-02 DIAGNOSIS — E1140 Type 2 diabetes mellitus with diabetic neuropathy, unspecified: Secondary | ICD-10-CM | POA: Diagnosis not present

## 2017-03-02 DIAGNOSIS — G8929 Other chronic pain: Secondary | ICD-10-CM | POA: Diagnosis not present

## 2017-03-02 DIAGNOSIS — S81802D Unspecified open wound, left lower leg, subsequent encounter: Secondary | ICD-10-CM | POA: Diagnosis not present

## 2017-03-02 DIAGNOSIS — M1991 Primary osteoarthritis, unspecified site: Secondary | ICD-10-CM | POA: Diagnosis not present

## 2017-03-02 DIAGNOSIS — R609 Edema, unspecified: Secondary | ICD-10-CM | POA: Diagnosis not present

## 2017-03-02 DIAGNOSIS — Z8673 Personal history of transient ischemic attack (TIA), and cerebral infarction without residual deficits: Secondary | ICD-10-CM | POA: Diagnosis not present

## 2017-03-02 DIAGNOSIS — Z7982 Long term (current) use of aspirin: Secondary | ICD-10-CM | POA: Diagnosis not present

## 2017-03-03 DIAGNOSIS — M47816 Spondylosis without myelopathy or radiculopathy, lumbar region: Secondary | ICD-10-CM | POA: Diagnosis not present

## 2017-03-03 DIAGNOSIS — G894 Chronic pain syndrome: Secondary | ICD-10-CM | POA: Diagnosis not present

## 2017-03-03 DIAGNOSIS — E1142 Type 2 diabetes mellitus with diabetic polyneuropathy: Secondary | ICD-10-CM | POA: Diagnosis not present

## 2017-03-03 DIAGNOSIS — M25559 Pain in unspecified hip: Secondary | ICD-10-CM | POA: Diagnosis not present

## 2017-03-05 ENCOUNTER — Other Ambulatory Visit: Payer: Self-pay | Admitting: Cardiology

## 2017-03-24 DIAGNOSIS — I4891 Unspecified atrial fibrillation: Secondary | ICD-10-CM | POA: Diagnosis not present

## 2017-03-24 DIAGNOSIS — Z794 Long term (current) use of insulin: Secondary | ICD-10-CM | POA: Diagnosis not present

## 2017-03-24 DIAGNOSIS — I87312 Chronic venous hypertension (idiopathic) with ulcer of left lower extremity: Secondary | ICD-10-CM | POA: Diagnosis not present

## 2017-03-24 DIAGNOSIS — M1990 Unspecified osteoarthritis, unspecified site: Secondary | ICD-10-CM | POA: Diagnosis not present

## 2017-03-24 DIAGNOSIS — I10 Essential (primary) hypertension: Secondary | ICD-10-CM | POA: Diagnosis not present

## 2017-03-24 DIAGNOSIS — Z7982 Long term (current) use of aspirin: Secondary | ICD-10-CM | POA: Diagnosis not present

## 2017-03-24 DIAGNOSIS — Z79899 Other long term (current) drug therapy: Secondary | ICD-10-CM | POA: Diagnosis not present

## 2017-03-24 DIAGNOSIS — Z881 Allergy status to other antibiotic agents status: Secondary | ICD-10-CM | POA: Diagnosis not present

## 2017-03-24 DIAGNOSIS — L97828 Non-pressure chronic ulcer of other part of left lower leg with other specified severity: Secondary | ICD-10-CM | POA: Diagnosis not present

## 2017-03-24 DIAGNOSIS — L97822 Non-pressure chronic ulcer of other part of left lower leg with fat layer exposed: Secondary | ICD-10-CM | POA: Diagnosis not present

## 2017-03-24 DIAGNOSIS — I83028 Varicose veins of left lower extremity with ulcer other part of lower leg: Secondary | ICD-10-CM | POA: Diagnosis not present

## 2017-03-24 DIAGNOSIS — E1140 Type 2 diabetes mellitus with diabetic neuropathy, unspecified: Secondary | ICD-10-CM | POA: Diagnosis not present

## 2017-03-24 DIAGNOSIS — L89313 Pressure ulcer of right buttock, stage 3: Secondary | ICD-10-CM | POA: Diagnosis not present

## 2017-03-24 DIAGNOSIS — I872 Venous insufficiency (chronic) (peripheral): Secondary | ICD-10-CM | POA: Diagnosis not present

## 2017-03-25 DIAGNOSIS — L97929 Non-pressure chronic ulcer of unspecified part of left lower leg with unspecified severity: Secondary | ICD-10-CM | POA: Diagnosis not present

## 2017-03-25 DIAGNOSIS — M1991 Primary osteoarthritis, unspecified site: Secondary | ICD-10-CM | POA: Diagnosis not present

## 2017-03-25 DIAGNOSIS — I4891 Unspecified atrial fibrillation: Secondary | ICD-10-CM | POA: Diagnosis not present

## 2017-03-25 DIAGNOSIS — E1142 Type 2 diabetes mellitus with diabetic polyneuropathy: Secondary | ICD-10-CM | POA: Diagnosis not present

## 2017-03-25 DIAGNOSIS — I87312 Chronic venous hypertension (idiopathic) with ulcer of left lower extremity: Secondary | ICD-10-CM | POA: Diagnosis not present

## 2017-03-25 DIAGNOSIS — G8929 Other chronic pain: Secondary | ICD-10-CM | POA: Diagnosis not present

## 2017-03-25 DIAGNOSIS — R609 Edema, unspecified: Secondary | ICD-10-CM | POA: Diagnosis not present

## 2017-03-25 DIAGNOSIS — Z794 Long term (current) use of insulin: Secondary | ICD-10-CM | POA: Diagnosis not present

## 2017-03-25 DIAGNOSIS — Z466 Encounter for fitting and adjustment of urinary device: Secondary | ICD-10-CM | POA: Diagnosis not present

## 2017-03-25 DIAGNOSIS — I10 Essential (primary) hypertension: Secondary | ICD-10-CM | POA: Diagnosis not present

## 2017-03-25 DIAGNOSIS — L89313 Pressure ulcer of right buttock, stage 3: Secondary | ICD-10-CM | POA: Diagnosis not present

## 2017-03-28 DIAGNOSIS — R609 Edema, unspecified: Secondary | ICD-10-CM | POA: Diagnosis not present

## 2017-03-28 DIAGNOSIS — L89313 Pressure ulcer of right buttock, stage 3: Secondary | ICD-10-CM | POA: Diagnosis not present

## 2017-03-28 DIAGNOSIS — I4891 Unspecified atrial fibrillation: Secondary | ICD-10-CM | POA: Diagnosis not present

## 2017-03-28 DIAGNOSIS — I10 Essential (primary) hypertension: Secondary | ICD-10-CM | POA: Diagnosis not present

## 2017-03-28 DIAGNOSIS — M1991 Primary osteoarthritis, unspecified site: Secondary | ICD-10-CM | POA: Diagnosis not present

## 2017-03-28 DIAGNOSIS — I87312 Chronic venous hypertension (idiopathic) with ulcer of left lower extremity: Secondary | ICD-10-CM | POA: Diagnosis not present

## 2017-03-28 DIAGNOSIS — G8929 Other chronic pain: Secondary | ICD-10-CM | POA: Diagnosis not present

## 2017-03-28 DIAGNOSIS — Z794 Long term (current) use of insulin: Secondary | ICD-10-CM | POA: Diagnosis not present

## 2017-03-28 DIAGNOSIS — E1142 Type 2 diabetes mellitus with diabetic polyneuropathy: Secondary | ICD-10-CM | POA: Diagnosis not present

## 2017-03-28 DIAGNOSIS — Z466 Encounter for fitting and adjustment of urinary device: Secondary | ICD-10-CM | POA: Diagnosis not present

## 2017-03-28 DIAGNOSIS — L97929 Non-pressure chronic ulcer of unspecified part of left lower leg with unspecified severity: Secondary | ICD-10-CM | POA: Diagnosis not present

## 2017-03-30 DIAGNOSIS — R339 Retention of urine, unspecified: Secondary | ICD-10-CM | POA: Diagnosis not present

## 2017-03-30 DIAGNOSIS — L89312 Pressure ulcer of right buttock, stage 2: Secondary | ICD-10-CM | POA: Diagnosis not present

## 2017-03-30 DIAGNOSIS — S81802D Unspecified open wound, left lower leg, subsequent encounter: Secondary | ICD-10-CM | POA: Diagnosis not present

## 2017-03-30 DIAGNOSIS — N390 Urinary tract infection, site not specified: Secondary | ICD-10-CM | POA: Diagnosis not present

## 2017-03-30 DIAGNOSIS — I482 Chronic atrial fibrillation: Secondary | ICD-10-CM | POA: Diagnosis not present

## 2017-03-31 DIAGNOSIS — L89313 Pressure ulcer of right buttock, stage 3: Secondary | ICD-10-CM | POA: Diagnosis not present

## 2017-03-31 DIAGNOSIS — L89312 Pressure ulcer of right buttock, stage 2: Secondary | ICD-10-CM | POA: Diagnosis not present

## 2017-03-31 DIAGNOSIS — R339 Retention of urine, unspecified: Secondary | ICD-10-CM | POA: Diagnosis not present

## 2017-03-31 DIAGNOSIS — Z466 Encounter for fitting and adjustment of urinary device: Secondary | ICD-10-CM | POA: Diagnosis not present

## 2017-03-31 DIAGNOSIS — S81802D Unspecified open wound, left lower leg, subsequent encounter: Secondary | ICD-10-CM | POA: Diagnosis not present

## 2017-03-31 DIAGNOSIS — N390 Urinary tract infection, site not specified: Secondary | ICD-10-CM | POA: Diagnosis not present

## 2017-04-01 DIAGNOSIS — M1991 Primary osteoarthritis, unspecified site: Secondary | ICD-10-CM | POA: Diagnosis not present

## 2017-04-01 DIAGNOSIS — Z794 Long term (current) use of insulin: Secondary | ICD-10-CM | POA: Diagnosis not present

## 2017-04-01 DIAGNOSIS — I87312 Chronic venous hypertension (idiopathic) with ulcer of left lower extremity: Secondary | ICD-10-CM | POA: Diagnosis not present

## 2017-04-01 DIAGNOSIS — E1142 Type 2 diabetes mellitus with diabetic polyneuropathy: Secondary | ICD-10-CM | POA: Diagnosis not present

## 2017-04-01 DIAGNOSIS — L89313 Pressure ulcer of right buttock, stage 3: Secondary | ICD-10-CM | POA: Diagnosis not present

## 2017-04-01 DIAGNOSIS — L97929 Non-pressure chronic ulcer of unspecified part of left lower leg with unspecified severity: Secondary | ICD-10-CM | POA: Diagnosis not present

## 2017-04-01 DIAGNOSIS — I4891 Unspecified atrial fibrillation: Secondary | ICD-10-CM | POA: Diagnosis not present

## 2017-04-01 DIAGNOSIS — Z466 Encounter for fitting and adjustment of urinary device: Secondary | ICD-10-CM | POA: Diagnosis not present

## 2017-04-01 DIAGNOSIS — I10 Essential (primary) hypertension: Secondary | ICD-10-CM | POA: Diagnosis not present

## 2017-04-01 DIAGNOSIS — G8929 Other chronic pain: Secondary | ICD-10-CM | POA: Diagnosis not present

## 2017-04-01 DIAGNOSIS — N390 Urinary tract infection, site not specified: Secondary | ICD-10-CM | POA: Diagnosis not present

## 2017-04-01 DIAGNOSIS — R609 Edema, unspecified: Secondary | ICD-10-CM | POA: Diagnosis not present

## 2017-04-04 DIAGNOSIS — L97829 Non-pressure chronic ulcer of other part of left lower leg with unspecified severity: Secondary | ICD-10-CM | POA: Diagnosis not present

## 2017-04-04 DIAGNOSIS — I739 Peripheral vascular disease, unspecified: Secondary | ICD-10-CM | POA: Diagnosis not present

## 2017-04-06 DIAGNOSIS — M47816 Spondylosis without myelopathy or radiculopathy, lumbar region: Secondary | ICD-10-CM | POA: Diagnosis not present

## 2017-04-06 DIAGNOSIS — E1142 Type 2 diabetes mellitus with diabetic polyneuropathy: Secondary | ICD-10-CM | POA: Diagnosis not present

## 2017-04-06 DIAGNOSIS — G894 Chronic pain syndrome: Secondary | ICD-10-CM | POA: Diagnosis not present

## 2017-04-06 DIAGNOSIS — Z79891 Long term (current) use of opiate analgesic: Secondary | ICD-10-CM | POA: Diagnosis not present

## 2017-04-06 DIAGNOSIS — Z79899 Other long term (current) drug therapy: Secondary | ICD-10-CM | POA: Diagnosis not present

## 2017-04-06 DIAGNOSIS — M25559 Pain in unspecified hip: Secondary | ICD-10-CM | POA: Diagnosis not present

## 2017-04-07 DIAGNOSIS — E1142 Type 2 diabetes mellitus with diabetic polyneuropathy: Secondary | ICD-10-CM | POA: Diagnosis not present

## 2017-04-07 DIAGNOSIS — L97929 Non-pressure chronic ulcer of unspecified part of left lower leg with unspecified severity: Secondary | ICD-10-CM | POA: Diagnosis not present

## 2017-04-07 DIAGNOSIS — I4891 Unspecified atrial fibrillation: Secondary | ICD-10-CM | POA: Diagnosis not present

## 2017-04-07 DIAGNOSIS — L89313 Pressure ulcer of right buttock, stage 3: Secondary | ICD-10-CM | POA: Diagnosis not present

## 2017-04-07 DIAGNOSIS — G8929 Other chronic pain: Secondary | ICD-10-CM | POA: Diagnosis not present

## 2017-04-07 DIAGNOSIS — M1991 Primary osteoarthritis, unspecified site: Secondary | ICD-10-CM | POA: Diagnosis not present

## 2017-04-07 DIAGNOSIS — Z794 Long term (current) use of insulin: Secondary | ICD-10-CM | POA: Diagnosis not present

## 2017-04-07 DIAGNOSIS — R609 Edema, unspecified: Secondary | ICD-10-CM | POA: Diagnosis not present

## 2017-04-07 DIAGNOSIS — Z466 Encounter for fitting and adjustment of urinary device: Secondary | ICD-10-CM | POA: Diagnosis not present

## 2017-04-07 DIAGNOSIS — I87312 Chronic venous hypertension (idiopathic) with ulcer of left lower extremity: Secondary | ICD-10-CM | POA: Diagnosis not present

## 2017-04-07 DIAGNOSIS — I10 Essential (primary) hypertension: Secondary | ICD-10-CM | POA: Diagnosis not present

## 2017-04-12 DIAGNOSIS — M1991 Primary osteoarthritis, unspecified site: Secondary | ICD-10-CM | POA: Diagnosis not present

## 2017-04-12 DIAGNOSIS — Z794 Long term (current) use of insulin: Secondary | ICD-10-CM | POA: Diagnosis not present

## 2017-04-12 DIAGNOSIS — I87312 Chronic venous hypertension (idiopathic) with ulcer of left lower extremity: Secondary | ICD-10-CM | POA: Diagnosis not present

## 2017-04-12 DIAGNOSIS — Z466 Encounter for fitting and adjustment of urinary device: Secondary | ICD-10-CM | POA: Diagnosis not present

## 2017-04-12 DIAGNOSIS — G8929 Other chronic pain: Secondary | ICD-10-CM | POA: Diagnosis not present

## 2017-04-12 DIAGNOSIS — I4891 Unspecified atrial fibrillation: Secondary | ICD-10-CM | POA: Diagnosis not present

## 2017-04-12 DIAGNOSIS — I10 Essential (primary) hypertension: Secondary | ICD-10-CM | POA: Diagnosis not present

## 2017-04-12 DIAGNOSIS — L89313 Pressure ulcer of right buttock, stage 3: Secondary | ICD-10-CM | POA: Diagnosis not present

## 2017-04-12 DIAGNOSIS — R609 Edema, unspecified: Secondary | ICD-10-CM | POA: Diagnosis not present

## 2017-04-12 DIAGNOSIS — L97929 Non-pressure chronic ulcer of unspecified part of left lower leg with unspecified severity: Secondary | ICD-10-CM | POA: Diagnosis not present

## 2017-04-12 DIAGNOSIS — E1142 Type 2 diabetes mellitus with diabetic polyneuropathy: Secondary | ICD-10-CM | POA: Diagnosis not present

## 2017-04-14 ENCOUNTER — Encounter: Payer: Self-pay | Admitting: Cardiology

## 2017-04-14 DIAGNOSIS — R112 Nausea with vomiting, unspecified: Secondary | ICD-10-CM | POA: Diagnosis not present

## 2017-04-14 DIAGNOSIS — R262 Difficulty in walking, not elsewhere classified: Secondary | ICD-10-CM | POA: Diagnosis not present

## 2017-04-14 DIAGNOSIS — Z87891 Personal history of nicotine dependence: Secondary | ICD-10-CM | POA: Diagnosis not present

## 2017-04-14 DIAGNOSIS — K2970 Gastritis, unspecified, without bleeding: Secondary | ICD-10-CM | POA: Diagnosis not present

## 2017-04-14 DIAGNOSIS — K5909 Other constipation: Secondary | ICD-10-CM | POA: Diagnosis not present

## 2017-04-14 DIAGNOSIS — Z79899 Other long term (current) drug therapy: Secondary | ICD-10-CM | POA: Diagnosis not present

## 2017-04-14 DIAGNOSIS — M6281 Muscle weakness (generalized): Secondary | ICD-10-CM | POA: Diagnosis not present

## 2017-04-14 DIAGNOSIS — Z466 Encounter for fitting and adjustment of urinary device: Secondary | ICD-10-CM | POA: Diagnosis not present

## 2017-04-14 DIAGNOSIS — Z7982 Long term (current) use of aspirin: Secondary | ICD-10-CM | POA: Diagnosis not present

## 2017-04-14 DIAGNOSIS — B952 Enterococcus as the cause of diseases classified elsewhere: Secondary | ICD-10-CM | POA: Diagnosis not present

## 2017-04-14 DIAGNOSIS — E1165 Type 2 diabetes mellitus with hyperglycemia: Secondary | ICD-10-CM | POA: Diagnosis not present

## 2017-04-14 DIAGNOSIS — Z794 Long term (current) use of insulin: Secondary | ICD-10-CM | POA: Diagnosis not present

## 2017-04-14 DIAGNOSIS — I10 Essential (primary) hypertension: Secondary | ICD-10-CM | POA: Diagnosis not present

## 2017-04-14 DIAGNOSIS — M4800 Spinal stenosis, site unspecified: Secondary | ICD-10-CM | POA: Diagnosis not present

## 2017-04-14 DIAGNOSIS — R339 Retention of urine, unspecified: Secondary | ICD-10-CM | POA: Diagnosis not present

## 2017-04-14 DIAGNOSIS — N390 Urinary tract infection, site not specified: Secondary | ICD-10-CM | POA: Diagnosis not present

## 2017-04-14 DIAGNOSIS — R1111 Vomiting without nausea: Secondary | ICD-10-CM | POA: Diagnosis not present

## 2017-04-14 DIAGNOSIS — R109 Unspecified abdominal pain: Secondary | ICD-10-CM | POA: Diagnosis not present

## 2017-04-14 DIAGNOSIS — E119 Type 2 diabetes mellitus without complications: Secondary | ICD-10-CM | POA: Diagnosis not present

## 2017-04-14 DIAGNOSIS — Z8673 Personal history of transient ischemic attack (TIA), and cerebral infarction without residual deficits: Secondary | ICD-10-CM | POA: Diagnosis not present

## 2017-04-14 DIAGNOSIS — E860 Dehydration: Secondary | ICD-10-CM | POA: Diagnosis not present

## 2017-04-15 DIAGNOSIS — R112 Nausea with vomiting, unspecified: Secondary | ICD-10-CM | POA: Diagnosis not present

## 2017-04-15 DIAGNOSIS — Z466 Encounter for fitting and adjustment of urinary device: Secondary | ICD-10-CM | POA: Diagnosis not present

## 2017-04-15 DIAGNOSIS — E860 Dehydration: Secondary | ICD-10-CM | POA: Diagnosis not present

## 2017-04-15 DIAGNOSIS — I10 Essential (primary) hypertension: Secondary | ICD-10-CM | POA: Diagnosis not present

## 2017-04-15 DIAGNOSIS — E119 Type 2 diabetes mellitus without complications: Secondary | ICD-10-CM | POA: Diagnosis not present

## 2017-04-15 DIAGNOSIS — L89313 Pressure ulcer of right buttock, stage 3: Secondary | ICD-10-CM | POA: Diagnosis not present

## 2017-04-15 DIAGNOSIS — R339 Retention of urine, unspecified: Secondary | ICD-10-CM | POA: Diagnosis not present

## 2017-04-16 ENCOUNTER — Encounter: Payer: Self-pay | Admitting: Cardiology

## 2017-04-16 DIAGNOSIS — E119 Type 2 diabetes mellitus without complications: Secondary | ICD-10-CM | POA: Diagnosis not present

## 2017-04-16 DIAGNOSIS — I10 Essential (primary) hypertension: Secondary | ICD-10-CM | POA: Diagnosis not present

## 2017-04-16 DIAGNOSIS — R112 Nausea with vomiting, unspecified: Secondary | ICD-10-CM | POA: Diagnosis not present

## 2017-04-16 DIAGNOSIS — N390 Urinary tract infection, site not specified: Secondary | ICD-10-CM | POA: Diagnosis not present

## 2017-04-16 DIAGNOSIS — K5909 Other constipation: Secondary | ICD-10-CM | POA: Diagnosis not present

## 2017-04-28 DIAGNOSIS — L89313 Pressure ulcer of right buttock, stage 3: Secondary | ICD-10-CM | POA: Diagnosis not present

## 2017-05-03 DIAGNOSIS — Z0001 Encounter for general adult medical examination with abnormal findings: Secondary | ICD-10-CM | POA: Diagnosis not present

## 2017-05-03 DIAGNOSIS — I10 Essential (primary) hypertension: Secondary | ICD-10-CM | POA: Diagnosis not present

## 2017-05-03 DIAGNOSIS — E1142 Type 2 diabetes mellitus with diabetic polyneuropathy: Secondary | ICD-10-CM | POA: Diagnosis not present

## 2017-05-03 DIAGNOSIS — E11621 Type 2 diabetes mellitus with foot ulcer: Secondary | ICD-10-CM | POA: Diagnosis not present

## 2017-05-03 DIAGNOSIS — E1165 Type 2 diabetes mellitus with hyperglycemia: Secondary | ICD-10-CM | POA: Diagnosis not present

## 2017-05-04 ENCOUNTER — Ambulatory Visit: Payer: Medicare Other | Admitting: Cardiology

## 2017-05-04 NOTE — Progress Notes (Deleted)
Clinical Summary Brianna Ray is a 72 y.o.female seen today for follow up of the following medical problems.   1. Bradycardia - 11/2016 admission with UTI, junctinoal bradycardia in 30. She denies any specific symptoms. From discharge summary av nodal agents were held and bradycardia resolved. She was to take lower doses of both dilt and TOprol at discharge however appears she has continued to take the same dose she was on prior to admission  - no recent symptoms since last visit  2. Palpitations - seen by cardiology about 10 years ago in [place] per her report - I presume this is why she is on both Toprol and diltiazem  - since lowering dose of her av nodal agents no increase in palpitatoins.   3. LE edema/Chronic diastolic HF - 08/3974 echo LVEF 55-60%, grade II diastolic dysfunction - reports recent increaesd leg edema, some SOB.    Past Medical History:  Diagnosis Date  . Anxiety   . Cancer ([place])   . Depression   . Diabetes mellitus   . Dysrhythmia   . Fibromyalgia   . Guaiac positive stools   . Hypertension   . Neuromuscular disorder ([place])   . Reflux   . Ruptured lumbar disc    x2  . Shortness of breath   . Spinal stenosis      Allergies  Allergen Reactions  . Macrodantin [Nitrofurantoin]      Current Outpatient Medications  Medication Sig Dispense Refill  . aspirin EC 81 MG tablet Take 81 mg by mouth daily.    . cholecalciferol (VITAMIN D) 400 units TABS tablet Take 400 Units by mouth daily.    Marland Kitchen diltiazem (CARDIZEM) 60 MG tablet Take 2 tablets (120 mg total) by mouth 2 (two) times daily. 360 tablet 3  . enalapril (VASOTEC) 20 MG tablet Take 20 mg by mouth daily.    . furosemide (LASIX) 40 MG tablet Take 1.5 tablets (60 mg total) by mouth daily. 45 tablet 3  . gabapentin (NEURONTIN) 600 MG tablet Take 600 mg by mouth 3 (three) times daily.    Marland Kitchen glyBURIDE (DIABETA) 5 MG tablet Take 10 mg by mouth daily with breakfast.     . insulin glargine  (LANTUS) 100 UNIT/ML injection Inject 28 Units into the skin 2 (two) times daily.     . metFORMIN (GLUCOPHAGE) 1000 MG tablet Take 1,000 mg by mouth 2 (two) times daily.     . metoprolol succinate (TOPROL-XL) 50 MG 24 hr tablet TAKE 1 TABLET BY MOUTH ONCE DAILY. TAKE WITH OR IMMEDIATELY FOLLOWING A MEAL 30 tablet 1  . Multiple Vitamin (MULTIVITAMIN) tablet Take 1 tablet by mouth daily.    . Nutritional Supplements (ARGINAID PO) Take by mouth 2 (two) times daily.    . ondansetron (ZOFRAN) 8 MG tablet Take 8 mg by mouth every 8 (eight) hours as needed for nausea or vomiting.    Marland Kitchen oxyCODONE-acetaminophen (PERCOCET) 10-325 MG tablet Take 1 tablet by mouth every 6 (six) hours as needed for pain.     . protein supplement (PROMOD) POWD Take 1 scoop by mouth 2 (two) times daily.    . vitamin B-12 (CYANOCOBALAMIN) 1000 MCG tablet Take 1,000 mcg by mouth daily.    . vitamin C (ASCORBIC ACID) 500 MG tablet Take 500 mg by mouth daily.    Marland Kitchen zinc gluconate 50 MG tablet Take 50 mg by mouth daily.     No current facility-administered medications for this visit.  Past Surgical History:  Procedure Laterality Date  . ABDOMINAL HYSTERECTOMY    . APPENDECTOMY    . CARPAL TUNNEL RELEASE Bilateral   . CHOLECYSTECTOMY    . COLONOSCOPY  10/21/2010   Procedure: COLONOSCOPY;  Surgeon: Rogene Houston, MD;  [facility];  Service: Endoscopy;  Laterality: N/A;  . COLONOSCOPY  10/21/2010  . ESOPHAGOGASTRODUODENOSCOPY  10/21/2010   Procedure: ESOPHAGOGASTRODUODENOSCOPY (EGD);  Surgeon: Rogene Houston, MD;  [facility];  Service: Endoscopy;  Laterality: N/A;  . EYE SURGERY    . TONSILLECTOMY  AGE 37     Allergies  Allergen Reactions  . Macrodantin [Nitrofurantoin]       No family history on file.   Social History Ms. Dippolito reports that she has quit smoking. she has never used smokeless tobacco. Ms. Polo reports that she does not drink alcohol.   Review of  Systems CONSTITUTIONAL: No weight loss, fever, chills, weakness or fatigue.  HEENT: Eyes: No visual loss, blurred vision, double vision or yellow sclerae.No hearing loss, sneezing, congestion, runny nose or sore throat.  SKIN: No rash or itching.  CARDIOVASCULAR:  RESPIRATORY: No shortness of breath, cough or sputum.  GASTROINTESTINAL: No anorexia, nausea, vomiting or diarrhea. No abdominal pain or blood.  GENITOURINARY: No burning on urination, no polyuria NEUROLOGICAL: No headache, dizziness, syncope, paralysis, ataxia, numbness or tingling in the extremities. No change in bowel or bladder control.  MUSCULOSKELETAL: No muscle, back pain, joint pain or stiffness.  LYMPHATICS: No enlarged nodes. No history of splenectomy.  PSYCHIATRIC: No history of depression or anxiety.  ENDOCRINOLOGIC: No reports of sweating, cold or heat intolerance. No polyuria or polydipsia.  Marland Kitchen   Physical Examination There were no vitals filed for this visit. There were no vitals filed for this visit.  Gen: resting comfortably, no acute distress HEENT: no scleral icterus, pupils equal round and reactive, no palptable cervical adenopathy,  CV Resp: Clear to auscultation bilaterally GI: abdomen is soft, non-tender, non-distended, normal bowel sounds, no hepatosplenomegaly MSK: extremities are warm, no edema.  Skin: warm, no rash Neuro:  no focal deficits Psych: appropriate affect   Diagnostic Studies 01/2017 echo [facility]: LVEF 55-60%, grade II diastolic dysfunction     Assessment and Plan  1. Bradycardia - jresolved with lower doses of av nodal agents - continue to monitor  2. LE edema - increase lasix to 60mg  daily.  - check BMET/Mg/TSH - obtain echo      Arnoldo Lenis, M.D., F.A.C.C.

## 2017-05-05 DIAGNOSIS — Z79891 Long term (current) use of opiate analgesic: Secondary | ICD-10-CM | POA: Diagnosis not present

## 2017-05-05 DIAGNOSIS — Z79899 Other long term (current) drug therapy: Secondary | ICD-10-CM | POA: Diagnosis not present

## 2017-05-05 DIAGNOSIS — G894 Chronic pain syndrome: Secondary | ICD-10-CM | POA: Diagnosis not present

## 2017-05-07 ENCOUNTER — Other Ambulatory Visit: Payer: Self-pay | Admitting: Cardiology

## 2017-05-09 DIAGNOSIS — M48062 Spinal stenosis, lumbar region with neurogenic claudication: Secondary | ICD-10-CM | POA: Diagnosis not present

## 2017-05-09 DIAGNOSIS — M5136 Other intervertebral disc degeneration, lumbar region: Secondary | ICD-10-CM | POA: Diagnosis not present

## 2017-05-09 DIAGNOSIS — G894 Chronic pain syndrome: Secondary | ICD-10-CM | POA: Diagnosis not present

## 2017-05-09 DIAGNOSIS — M47817 Spondylosis without myelopathy or radiculopathy, lumbosacral region: Secondary | ICD-10-CM | POA: Diagnosis not present

## 2017-05-10 DIAGNOSIS — Z7982 Long term (current) use of aspirin: Secondary | ICD-10-CM | POA: Diagnosis not present

## 2017-05-10 DIAGNOSIS — Z466 Encounter for fitting and adjustment of urinary device: Secondary | ICD-10-CM | POA: Diagnosis not present

## 2017-05-10 DIAGNOSIS — L89312 Pressure ulcer of right buttock, stage 2: Secondary | ICD-10-CM | POA: Diagnosis not present

## 2017-05-10 DIAGNOSIS — I10 Essential (primary) hypertension: Secondary | ICD-10-CM | POA: Diagnosis not present

## 2017-05-10 DIAGNOSIS — M5416 Radiculopathy, lumbar region: Secondary | ICD-10-CM | POA: Diagnosis not present

## 2017-05-10 DIAGNOSIS — R339 Retention of urine, unspecified: Secondary | ICD-10-CM | POA: Diagnosis not present

## 2017-05-10 DIAGNOSIS — M129 Arthropathy, unspecified: Secondary | ICD-10-CM | POA: Diagnosis not present

## 2017-05-10 DIAGNOSIS — E1142 Type 2 diabetes mellitus with diabetic polyneuropathy: Secondary | ICD-10-CM | POA: Diagnosis not present

## 2017-05-10 DIAGNOSIS — Z794 Long term (current) use of insulin: Secondary | ICD-10-CM | POA: Diagnosis not present

## 2017-05-10 DIAGNOSIS — Z4800 Encounter for change or removal of nonsurgical wound dressing: Secondary | ICD-10-CM | POA: Diagnosis not present

## 2017-05-12 DIAGNOSIS — Z466 Encounter for fitting and adjustment of urinary device: Secondary | ICD-10-CM | POA: Diagnosis not present

## 2017-05-12 DIAGNOSIS — R339 Retention of urine, unspecified: Secondary | ICD-10-CM | POA: Diagnosis not present

## 2017-05-12 DIAGNOSIS — I10 Essential (primary) hypertension: Secondary | ICD-10-CM | POA: Diagnosis not present

## 2017-05-12 DIAGNOSIS — Z7982 Long term (current) use of aspirin: Secondary | ICD-10-CM | POA: Diagnosis not present

## 2017-05-12 DIAGNOSIS — E1142 Type 2 diabetes mellitus with diabetic polyneuropathy: Secondary | ICD-10-CM | POA: Diagnosis not present

## 2017-05-12 DIAGNOSIS — M129 Arthropathy, unspecified: Secondary | ICD-10-CM | POA: Diagnosis not present

## 2017-05-12 DIAGNOSIS — Z4800 Encounter for change or removal of nonsurgical wound dressing: Secondary | ICD-10-CM | POA: Diagnosis not present

## 2017-05-12 DIAGNOSIS — Z794 Long term (current) use of insulin: Secondary | ICD-10-CM | POA: Diagnosis not present

## 2017-05-12 DIAGNOSIS — M5416 Radiculopathy, lumbar region: Secondary | ICD-10-CM | POA: Diagnosis not present

## 2017-05-12 DIAGNOSIS — L89312 Pressure ulcer of right buttock, stage 2: Secondary | ICD-10-CM | POA: Diagnosis not present

## 2017-05-13 ENCOUNTER — Other Ambulatory Visit (HOSPITAL_COMMUNITY)
Admission: AD | Admit: 2017-05-13 | Discharge: 2017-05-13 | Disposition: A | Discharge status: Home / Self Care | Payer: Medicare Other | Source: Other Acute Inpatient Hospital | Attending: Family Medicine | Admitting: Family Medicine

## 2017-05-13 DIAGNOSIS — Z466 Encounter for fitting and adjustment of urinary device: Secondary | ICD-10-CM | POA: Diagnosis not present

## 2017-05-13 DIAGNOSIS — Z794 Long term (current) use of insulin: Secondary | ICD-10-CM | POA: Diagnosis not present

## 2017-05-13 DIAGNOSIS — E1142 Type 2 diabetes mellitus with diabetic polyneuropathy: Secondary | ICD-10-CM | POA: Diagnosis not present

## 2017-05-13 DIAGNOSIS — N3090 Cystitis, unspecified without hematuria: Secondary | ICD-10-CM | POA: Diagnosis not present

## 2017-05-13 DIAGNOSIS — M129 Arthropathy, unspecified: Secondary | ICD-10-CM | POA: Diagnosis not present

## 2017-05-13 DIAGNOSIS — Z4800 Encounter for change or removal of nonsurgical wound dressing: Secondary | ICD-10-CM | POA: Diagnosis not present

## 2017-05-13 DIAGNOSIS — L89312 Pressure ulcer of right buttock, stage 2: Secondary | ICD-10-CM | POA: Diagnosis not present

## 2017-05-13 DIAGNOSIS — Z7982 Long term (current) use of aspirin: Secondary | ICD-10-CM | POA: Diagnosis not present

## 2017-05-13 DIAGNOSIS — R339 Retention of urine, unspecified: Secondary | ICD-10-CM | POA: Diagnosis not present

## 2017-05-13 DIAGNOSIS — I10 Essential (primary) hypertension: Secondary | ICD-10-CM | POA: Diagnosis not present

## 2017-05-13 DIAGNOSIS — M5416 Radiculopathy, lumbar region: Secondary | ICD-10-CM | POA: Diagnosis not present

## 2017-05-13 LAB — URINALYSIS, ROUTINE W REFLEX MICROSCOPIC
BILIRUBIN URINE: NEGATIVE
GLUCOSE, UA: NEGATIVE mg/dL
KETONES UR: NEGATIVE mg/dL
NITRITE: NEGATIVE
PH: 6 (ref 5.0–8.0)
Protein, ur: NEGATIVE mg/dL
Specific Gravity, Urine: 1.008 (ref 1.005–1.030)

## 2017-05-16 DIAGNOSIS — Z466 Encounter for fitting and adjustment of urinary device: Secondary | ICD-10-CM | POA: Diagnosis not present

## 2017-05-16 DIAGNOSIS — Z7982 Long term (current) use of aspirin: Secondary | ICD-10-CM | POA: Diagnosis not present

## 2017-05-16 DIAGNOSIS — L89312 Pressure ulcer of right buttock, stage 2: Secondary | ICD-10-CM | POA: Diagnosis not present

## 2017-05-16 DIAGNOSIS — Z794 Long term (current) use of insulin: Secondary | ICD-10-CM | POA: Diagnosis not present

## 2017-05-16 DIAGNOSIS — M129 Arthropathy, unspecified: Secondary | ICD-10-CM | POA: Diagnosis not present

## 2017-05-16 DIAGNOSIS — R339 Retention of urine, unspecified: Secondary | ICD-10-CM | POA: Diagnosis not present

## 2017-05-16 DIAGNOSIS — Z4800 Encounter for change or removal of nonsurgical wound dressing: Secondary | ICD-10-CM | POA: Diagnosis not present

## 2017-05-16 DIAGNOSIS — I10 Essential (primary) hypertension: Secondary | ICD-10-CM | POA: Diagnosis not present

## 2017-05-16 DIAGNOSIS — E1142 Type 2 diabetes mellitus with diabetic polyneuropathy: Secondary | ICD-10-CM | POA: Diagnosis not present

## 2017-05-16 DIAGNOSIS — M5416 Radiculopathy, lumbar region: Secondary | ICD-10-CM | POA: Diagnosis not present

## 2017-05-16 LAB — URINE CULTURE: Culture: >=100000 — AB

## 2017-05-17 DIAGNOSIS — Z7982 Long term (current) use of aspirin: Secondary | ICD-10-CM | POA: Diagnosis not present

## 2017-05-17 DIAGNOSIS — R339 Retention of urine, unspecified: Secondary | ICD-10-CM | POA: Diagnosis not present

## 2017-05-17 DIAGNOSIS — L89312 Pressure ulcer of right buttock, stage 2: Secondary | ICD-10-CM | POA: Diagnosis not present

## 2017-05-17 DIAGNOSIS — E1142 Type 2 diabetes mellitus with diabetic polyneuropathy: Secondary | ICD-10-CM | POA: Diagnosis not present

## 2017-05-17 DIAGNOSIS — M129 Arthropathy, unspecified: Secondary | ICD-10-CM | POA: Diagnosis not present

## 2017-05-17 DIAGNOSIS — Z4800 Encounter for change or removal of nonsurgical wound dressing: Secondary | ICD-10-CM | POA: Diagnosis not present

## 2017-05-17 DIAGNOSIS — M5416 Radiculopathy, lumbar region: Secondary | ICD-10-CM | POA: Diagnosis not present

## 2017-05-17 DIAGNOSIS — Z794 Long term (current) use of insulin: Secondary | ICD-10-CM | POA: Diagnosis not present

## 2017-05-17 DIAGNOSIS — Z466 Encounter for fitting and adjustment of urinary device: Secondary | ICD-10-CM | POA: Diagnosis not present

## 2017-05-17 DIAGNOSIS — I10 Essential (primary) hypertension: Secondary | ICD-10-CM | POA: Diagnosis not present

## 2017-05-19 DIAGNOSIS — I10 Essential (primary) hypertension: Secondary | ICD-10-CM | POA: Diagnosis not present

## 2017-05-19 DIAGNOSIS — M129 Arthropathy, unspecified: Secondary | ICD-10-CM | POA: Diagnosis not present

## 2017-05-19 DIAGNOSIS — R339 Retention of urine, unspecified: Secondary | ICD-10-CM | POA: Diagnosis not present

## 2017-05-19 DIAGNOSIS — Z466 Encounter for fitting and adjustment of urinary device: Secondary | ICD-10-CM | POA: Diagnosis not present

## 2017-05-19 DIAGNOSIS — E1142 Type 2 diabetes mellitus with diabetic polyneuropathy: Secondary | ICD-10-CM | POA: Diagnosis not present

## 2017-05-19 DIAGNOSIS — L89312 Pressure ulcer of right buttock, stage 2: Secondary | ICD-10-CM | POA: Diagnosis not present

## 2017-05-19 DIAGNOSIS — Z4800 Encounter for change or removal of nonsurgical wound dressing: Secondary | ICD-10-CM | POA: Diagnosis not present

## 2017-05-19 DIAGNOSIS — Z794 Long term (current) use of insulin: Secondary | ICD-10-CM | POA: Diagnosis not present

## 2017-05-19 DIAGNOSIS — Z7982 Long term (current) use of aspirin: Secondary | ICD-10-CM | POA: Diagnosis not present

## 2017-05-19 DIAGNOSIS — M5416 Radiculopathy, lumbar region: Secondary | ICD-10-CM | POA: Diagnosis not present

## 2017-05-20 DIAGNOSIS — Z4800 Encounter for change or removal of nonsurgical wound dressing: Secondary | ICD-10-CM | POA: Diagnosis not present

## 2017-05-20 DIAGNOSIS — R339 Retention of urine, unspecified: Secondary | ICD-10-CM | POA: Diagnosis not present

## 2017-05-20 DIAGNOSIS — Z7982 Long term (current) use of aspirin: Secondary | ICD-10-CM | POA: Diagnosis not present

## 2017-05-20 DIAGNOSIS — L89312 Pressure ulcer of right buttock, stage 2: Secondary | ICD-10-CM | POA: Diagnosis not present

## 2017-05-20 DIAGNOSIS — Z794 Long term (current) use of insulin: Secondary | ICD-10-CM | POA: Diagnosis not present

## 2017-05-20 DIAGNOSIS — E1142 Type 2 diabetes mellitus with diabetic polyneuropathy: Secondary | ICD-10-CM | POA: Diagnosis not present

## 2017-05-20 DIAGNOSIS — M129 Arthropathy, unspecified: Secondary | ICD-10-CM | POA: Diagnosis not present

## 2017-05-20 DIAGNOSIS — I10 Essential (primary) hypertension: Secondary | ICD-10-CM | POA: Diagnosis not present

## 2017-05-20 DIAGNOSIS — Z466 Encounter for fitting and adjustment of urinary device: Secondary | ICD-10-CM | POA: Diagnosis not present

## 2017-05-20 DIAGNOSIS — M5416 Radiculopathy, lumbar region: Secondary | ICD-10-CM | POA: Diagnosis not present

## 2017-05-26 DIAGNOSIS — L89312 Pressure ulcer of right buttock, stage 2: Secondary | ICD-10-CM | POA: Diagnosis not present

## 2017-05-26 DIAGNOSIS — E1142 Type 2 diabetes mellitus with diabetic polyneuropathy: Secondary | ICD-10-CM | POA: Diagnosis not present

## 2017-05-26 DIAGNOSIS — Z466 Encounter for fitting and adjustment of urinary device: Secondary | ICD-10-CM | POA: Diagnosis not present

## 2017-05-26 DIAGNOSIS — R339 Retention of urine, unspecified: Secondary | ICD-10-CM | POA: Diagnosis not present

## 2017-05-26 DIAGNOSIS — Z4800 Encounter for change or removal of nonsurgical wound dressing: Secondary | ICD-10-CM | POA: Diagnosis not present

## 2017-05-26 DIAGNOSIS — M5416 Radiculopathy, lumbar region: Secondary | ICD-10-CM | POA: Diagnosis not present

## 2017-05-26 DIAGNOSIS — Z7982 Long term (current) use of aspirin: Secondary | ICD-10-CM | POA: Diagnosis not present

## 2017-05-26 DIAGNOSIS — I10 Essential (primary) hypertension: Secondary | ICD-10-CM | POA: Diagnosis not present

## 2017-05-26 DIAGNOSIS — Z794 Long term (current) use of insulin: Secondary | ICD-10-CM | POA: Diagnosis not present

## 2017-05-26 DIAGNOSIS — M129 Arthropathy, unspecified: Secondary | ICD-10-CM | POA: Diagnosis not present

## 2017-05-27 DIAGNOSIS — Z794 Long term (current) use of insulin: Secondary | ICD-10-CM | POA: Diagnosis not present

## 2017-05-27 DIAGNOSIS — M129 Arthropathy, unspecified: Secondary | ICD-10-CM | POA: Diagnosis not present

## 2017-05-27 DIAGNOSIS — E1142 Type 2 diabetes mellitus with diabetic polyneuropathy: Secondary | ICD-10-CM | POA: Diagnosis not present

## 2017-05-27 DIAGNOSIS — R339 Retention of urine, unspecified: Secondary | ICD-10-CM | POA: Diagnosis not present

## 2017-05-27 DIAGNOSIS — L89312 Pressure ulcer of right buttock, stage 2: Secondary | ICD-10-CM | POA: Diagnosis not present

## 2017-05-27 DIAGNOSIS — Z466 Encounter for fitting and adjustment of urinary device: Secondary | ICD-10-CM | POA: Diagnosis not present

## 2017-05-27 DIAGNOSIS — Z4800 Encounter for change or removal of nonsurgical wound dressing: Secondary | ICD-10-CM | POA: Diagnosis not present

## 2017-05-27 DIAGNOSIS — M5416 Radiculopathy, lumbar region: Secondary | ICD-10-CM | POA: Diagnosis not present

## 2017-05-27 DIAGNOSIS — I10 Essential (primary) hypertension: Secondary | ICD-10-CM | POA: Diagnosis not present

## 2017-05-27 DIAGNOSIS — Z7982 Long term (current) use of aspirin: Secondary | ICD-10-CM | POA: Diagnosis not present

## 2017-05-31 DIAGNOSIS — Z794 Long term (current) use of insulin: Secondary | ICD-10-CM | POA: Diagnosis not present

## 2017-05-31 DIAGNOSIS — Z4800 Encounter for change or removal of nonsurgical wound dressing: Secondary | ICD-10-CM | POA: Diagnosis not present

## 2017-05-31 DIAGNOSIS — E1142 Type 2 diabetes mellitus with diabetic polyneuropathy: Secondary | ICD-10-CM | POA: Diagnosis not present

## 2017-05-31 DIAGNOSIS — R339 Retention of urine, unspecified: Secondary | ICD-10-CM | POA: Diagnosis not present

## 2017-05-31 DIAGNOSIS — M129 Arthropathy, unspecified: Secondary | ICD-10-CM | POA: Diagnosis not present

## 2017-05-31 DIAGNOSIS — L89312 Pressure ulcer of right buttock, stage 2: Secondary | ICD-10-CM | POA: Diagnosis not present

## 2017-05-31 DIAGNOSIS — I10 Essential (primary) hypertension: Secondary | ICD-10-CM | POA: Diagnosis not present

## 2017-05-31 DIAGNOSIS — Z466 Encounter for fitting and adjustment of urinary device: Secondary | ICD-10-CM | POA: Diagnosis not present

## 2017-05-31 DIAGNOSIS — M5416 Radiculopathy, lumbar region: Secondary | ICD-10-CM | POA: Diagnosis not present

## 2017-05-31 DIAGNOSIS — Z7982 Long term (current) use of aspirin: Secondary | ICD-10-CM | POA: Diagnosis not present

## 2017-06-01 DIAGNOSIS — L89312 Pressure ulcer of right buttock, stage 2: Secondary | ICD-10-CM | POA: Diagnosis not present

## 2017-06-01 DIAGNOSIS — M5416 Radiculopathy, lumbar region: Secondary | ICD-10-CM | POA: Diagnosis not present

## 2017-06-01 DIAGNOSIS — Z4800 Encounter for change or removal of nonsurgical wound dressing: Secondary | ICD-10-CM | POA: Diagnosis not present

## 2017-06-01 DIAGNOSIS — I10 Essential (primary) hypertension: Secondary | ICD-10-CM | POA: Diagnosis not present

## 2017-06-01 DIAGNOSIS — Z7982 Long term (current) use of aspirin: Secondary | ICD-10-CM | POA: Diagnosis not present

## 2017-06-01 DIAGNOSIS — R339 Retention of urine, unspecified: Secondary | ICD-10-CM | POA: Diagnosis not present

## 2017-06-01 DIAGNOSIS — Z466 Encounter for fitting and adjustment of urinary device: Secondary | ICD-10-CM | POA: Diagnosis not present

## 2017-06-01 DIAGNOSIS — E1142 Type 2 diabetes mellitus with diabetic polyneuropathy: Secondary | ICD-10-CM | POA: Diagnosis not present

## 2017-06-01 DIAGNOSIS — M129 Arthropathy, unspecified: Secondary | ICD-10-CM | POA: Diagnosis not present

## 2017-06-01 DIAGNOSIS — Z794 Long term (current) use of insulin: Secondary | ICD-10-CM | POA: Diagnosis not present

## 2017-06-02 ENCOUNTER — Other Ambulatory Visit: Payer: Self-pay

## 2017-06-02 ENCOUNTER — Ambulatory Visit: Payer: Medicare Other | Admitting: Cardiology

## 2017-06-02 ENCOUNTER — Encounter: Payer: Self-pay | Admitting: Cardiology

## 2017-06-02 VITALS — BP 150/73 | HR 93 | Ht 71.0 in | Wt 283.0 lb

## 2017-06-02 DIAGNOSIS — R600 Localized edema: Secondary | ICD-10-CM

## 2017-06-02 DIAGNOSIS — R002 Palpitations: Secondary | ICD-10-CM | POA: Diagnosis not present

## 2017-06-02 DIAGNOSIS — I10 Essential (primary) hypertension: Secondary | ICD-10-CM | POA: Diagnosis not present

## 2017-06-02 DIAGNOSIS — R001 Bradycardia, unspecified: Secondary | ICD-10-CM

## 2017-06-02 NOTE — Progress Notes (Signed)
Clinical Summary Brianna Ray is a 72 y.o.female seen today for follow up of the following medical problems.   1. Bradycardia - 11/2016 admission with UTI, junctinoal bradycardia in 30. She denies any specific symptoms. From discharge summary av nodal agents were held and bradycardia resolved. She was to take lower doses of both dilt and TOprol at discharge however appears she continued to take the same dose she was on prior to admission  - no recent symptoms since last visit   2. Palpitations - seen by cardiology about 10 years ago in [place] per her report - I presume this is why she is on both Toprol and diltiazem  - since lowering dose of her av nodal agents no increase in palpitatoins.   3. LE edema/Chronic diaastolic HF - reports recent increaesd leg edema, some SOB.   - last visit increased lasix to 60mg  daily - 01/2017 echo LVEF LVEF 55-60%, grade II diastoilc dysfunction - LE edema has improved.   4. Falls - recent fall at home, caused by legs giving out.  - undergoing home PT.   5. HTN - compliant with meds  Past Medical History:  Diagnosis Date  . Anxiety   . Cancer ([place])   . Depression   . Diabetes mellitus   . Dysrhythmia   . Fibromyalgia   . Guaiac positive stools   . Hypertension   . Neuromuscular disorder ([place])   . Reflux   . Ruptured lumbar disc    x2  . Shortness of breath   . Spinal stenosis      Allergies  Allergen Reactions  . Macrodantin [Nitrofurantoin]      Current Outpatient Medications  Medication Sig Dispense Refill  . aspirin EC 81 MG tablet Take 81 mg by mouth daily.    . cholecalciferol (VITAMIN D) 400 units TABS tablet Take 400 Units by mouth daily.    Marland Kitchen diltiazem (CARDIZEM) 60 MG tablet Take 2 tablets (120 mg total) by mouth 2 (two) times daily. 360 tablet 3  . enalapril (VASOTEC) 20 MG tablet Take 20 mg by mouth daily.    . furosemide (LASIX) 40 MG tablet Take 1.5 tablets (60 mg total) by mouth daily. 45 tablet  3  . gabapentin (NEURONTIN) 600 MG tablet Take 600 mg by mouth 3 (three) times daily.    Marland Kitchen glyBURIDE (DIABETA) 5 MG tablet Take 10 mg by mouth daily with breakfast.     . insulin glargine (LANTUS) 100 UNIT/ML injection Inject 28 Units into the skin 2 (two) times daily.     . metFORMIN (GLUCOPHAGE) 1000 MG tablet Take 1,000 mg by mouth 2 (two) times daily.     . metoprolol succinate (TOPROL-XL) 50 MG 24 hr tablet TAKE 1 TABLET BY MOUTH ONCE DAILY TAKE  WITH  OR  IMMEDIATELY  FOLLOWING  A  MEAL 90 tablet 1  . Multiple Vitamin (MULTIVITAMIN) tablet Take 1 tablet by mouth daily.    . Nutritional Supplements (ARGINAID PO) Take by mouth 2 (two) times daily.    . ondansetron (ZOFRAN) 8 MG tablet Take 8 mg by mouth every 8 (eight) hours as needed for nausea or vomiting.    Marland Kitchen oxyCODONE-acetaminophen (PERCOCET) 10-325 MG tablet Take 1 tablet by mouth every 6 (six) hours as needed for pain.     . protein supplement (PROMOD) POWD Take 1 scoop by mouth 2 (two) times daily.    . vitamin B-12 (CYANOCOBALAMIN) 1000 MCG tablet Take 1,000 mcg by mouth daily.    Marland Kitchen  vitamin C (ASCORBIC ACID) 500 MG tablet Take 500 mg by mouth daily.    Marland Kitchen zinc gluconate 50 MG tablet Take 50 mg by mouth daily.     No current facility-administered medications for this visit.      Past Surgical History:  Procedure Laterality Date  . ABDOMINAL HYSTERECTOMY    . APPENDECTOMY    . CARPAL TUNNEL RELEASE Bilateral   . CHOLECYSTECTOMY    . COLONOSCOPY  10/21/2010   Procedure: COLONOSCOPY;  Surgeon: Rogene Houston, MD;  [facility];  Service: Endoscopy;  Laterality: N/A;  . COLONOSCOPY  10/21/2010  . ESOPHAGOGASTRODUODENOSCOPY  10/21/2010   Procedure: ESOPHAGOGASTRODUODENOSCOPY (EGD);  Surgeon: Rogene Houston, MD;  Location: [facility];  Service: Endoscopy;  Laterality: N/A;  . EYE SURGERY    . TONSILLECTOMY  AGE 33     Allergies  Allergen Reactions  . Macrodantin [Nitrofurantoin]       No family history on  file.   Social History Brianna Ray reports that she has quit smoking. she has never used smokeless tobacco. Brianna Ray reports that she does not drink alcohol.   Review of Systems CONSTITUTIONAL: No weight loss, fever, chills, weakness or fatigue.  HEENT: Eyes: No visual loss, blurred vision, double vision or yellow sclerae.No hearing loss, sneezing, congestion, runny nose or sore throat.  SKIN: No rash or itching.  CARDIOVASCULAR: per hpi RESPIRATORY: No shortness of breath, cough or sputum.  GASTROINTESTINAL: No anorexia, nausea, vomiting or diarrhea. No abdominal pain or blood.  GENITOURINARY: No burning on urination, no polyuria NEUROLOGICAL: No headache, dizziness, syncope, paralysis, ataxia, numbness or tingling in the extremities. No change in bowel or bladder control.  MUSCULOSKELETAL: No muscle, back pain, joint pain or stiffness.  LYMPHATICS: No enlarged nodes. No history of splenectomy.  PSYCHIATRIC: No history of depression or anxiety.  ENDOCRINOLOGIC: No reports of sweating, cold or heat intolerance. No polyuria or polydipsia.  Marland Kitchen   Physical Examination Vitals:   06/02/17 1324  BP: (!) 150/73  Pulse: 93  SpO2: 98%   Vitals:   06/02/17 1324  Weight: 283 lb (128.4 kg)  Height: 5\' 11"  (1.803 m)    Gen: resting comfortably, no acute distress HEENT: no scleral icterus, pupils equal round and reactive, no palptable cervical adenopathy,  CV: RRR, no m/r/g, no jvd Resp: Clear to auscultation bilaterally GI: abdomen is soft, non-tender, non-distended, normal bowel sounds, no hepatosplenomegaly MSK: extremities are warm, no edema.  Skin: warm, no rash Neuro:  no focal deficits Psych: appropriate affect   Diagnostic Studies  - 01/2017 echo LVEF LVEF 55-60%, grade II diastoilc dysfunction   Assessment and Plan  1. Bradycardia - jresolved with lower doses of av nodal agents -no recent issues, continue to monitor.   2. LE edema - improved with increased  diuretic, weight down 13 lbs since 01/2017 - continue currnet meds   3. Palpitatations - no recent symptoms, continue to monitor  4. HTN - elevated in clinic, continue to monitor with conitnued diuresis.     Arnoldo Lenis, M.D.

## 2017-06-02 NOTE — Patient Instructions (Signed)
Your physician recommends that you schedule a follow-up appointment in: 6 WEEKS WITH DR BRANCH  Your physician recommends that you continue on your current medications as directed. Please refer to the Current Medication list given to you today.  Thank you for choosing [facility]!!    

## 2017-06-03 DIAGNOSIS — Z79899 Other long term (current) drug therapy: Secondary | ICD-10-CM | POA: Diagnosis not present

## 2017-06-03 DIAGNOSIS — M47816 Spondylosis without myelopathy or radiculopathy, lumbar region: Secondary | ICD-10-CM | POA: Diagnosis not present

## 2017-06-03 DIAGNOSIS — Z79891 Long term (current) use of opiate analgesic: Secondary | ICD-10-CM | POA: Diagnosis not present

## 2017-06-03 DIAGNOSIS — G894 Chronic pain syndrome: Secondary | ICD-10-CM | POA: Diagnosis not present

## 2017-06-03 DIAGNOSIS — M25559 Pain in unspecified hip: Secondary | ICD-10-CM | POA: Diagnosis not present

## 2017-06-07 ENCOUNTER — Encounter: Payer: Self-pay | Admitting: Cardiology

## 2017-06-07 DIAGNOSIS — Z4800 Encounter for change or removal of nonsurgical wound dressing: Secondary | ICD-10-CM | POA: Diagnosis not present

## 2017-06-07 DIAGNOSIS — Z466 Encounter for fitting and adjustment of urinary device: Secondary | ICD-10-CM | POA: Diagnosis not present

## 2017-06-07 DIAGNOSIS — E1142 Type 2 diabetes mellitus with diabetic polyneuropathy: Secondary | ICD-10-CM | POA: Diagnosis not present

## 2017-06-07 DIAGNOSIS — Z7982 Long term (current) use of aspirin: Secondary | ICD-10-CM | POA: Diagnosis not present

## 2017-06-07 DIAGNOSIS — Z794 Long term (current) use of insulin: Secondary | ICD-10-CM | POA: Diagnosis not present

## 2017-06-07 DIAGNOSIS — M129 Arthropathy, unspecified: Secondary | ICD-10-CM | POA: Diagnosis not present

## 2017-06-07 DIAGNOSIS — L89312 Pressure ulcer of right buttock, stage 2: Secondary | ICD-10-CM | POA: Diagnosis not present

## 2017-06-07 DIAGNOSIS — I10 Essential (primary) hypertension: Secondary | ICD-10-CM | POA: Diagnosis not present

## 2017-06-07 DIAGNOSIS — R339 Retention of urine, unspecified: Secondary | ICD-10-CM | POA: Diagnosis not present

## 2017-06-07 DIAGNOSIS — M5416 Radiculopathy, lumbar region: Secondary | ICD-10-CM | POA: Diagnosis not present

## 2017-06-08 DIAGNOSIS — E1142 Type 2 diabetes mellitus with diabetic polyneuropathy: Secondary | ICD-10-CM | POA: Diagnosis not present

## 2017-06-08 DIAGNOSIS — I10 Essential (primary) hypertension: Secondary | ICD-10-CM | POA: Diagnosis not present

## 2017-06-08 DIAGNOSIS — Z4800 Encounter for change or removal of nonsurgical wound dressing: Secondary | ICD-10-CM | POA: Diagnosis not present

## 2017-06-08 DIAGNOSIS — R339 Retention of urine, unspecified: Secondary | ICD-10-CM | POA: Diagnosis not present

## 2017-06-08 DIAGNOSIS — M129 Arthropathy, unspecified: Secondary | ICD-10-CM | POA: Diagnosis not present

## 2017-06-08 DIAGNOSIS — Z7982 Long term (current) use of aspirin: Secondary | ICD-10-CM | POA: Diagnosis not present

## 2017-06-08 DIAGNOSIS — L89312 Pressure ulcer of right buttock, stage 2: Secondary | ICD-10-CM | POA: Diagnosis not present

## 2017-06-08 DIAGNOSIS — M5416 Radiculopathy, lumbar region: Secondary | ICD-10-CM | POA: Diagnosis not present

## 2017-06-08 DIAGNOSIS — Z794 Long term (current) use of insulin: Secondary | ICD-10-CM | POA: Diagnosis not present

## 2017-06-08 DIAGNOSIS — Z466 Encounter for fitting and adjustment of urinary device: Secondary | ICD-10-CM | POA: Diagnosis not present

## 2017-06-09 ENCOUNTER — Encounter: Payer: Self-pay | Admitting: *Deleted

## 2017-06-09 DIAGNOSIS — R338 Other retention of urine: Secondary | ICD-10-CM | POA: Diagnosis not present

## 2017-06-30 DIAGNOSIS — Z79899 Other long term (current) drug therapy: Secondary | ICD-10-CM | POA: Diagnosis not present

## 2017-06-30 DIAGNOSIS — Z79891 Long term (current) use of opiate analgesic: Secondary | ICD-10-CM | POA: Diagnosis not present

## 2017-06-30 DIAGNOSIS — G894 Chronic pain syndrome: Secondary | ICD-10-CM | POA: Diagnosis not present

## 2017-06-30 DIAGNOSIS — M169 Osteoarthritis of hip, unspecified: Secondary | ICD-10-CM | POA: Diagnosis not present

## 2017-07-06 DIAGNOSIS — Z7982 Long term (current) use of aspirin: Secondary | ICD-10-CM | POA: Diagnosis not present

## 2017-07-06 DIAGNOSIS — I10 Essential (primary) hypertension: Secondary | ICD-10-CM | POA: Diagnosis not present

## 2017-07-06 DIAGNOSIS — R339 Retention of urine, unspecified: Secondary | ICD-10-CM | POA: Diagnosis not present

## 2017-07-06 DIAGNOSIS — L89312 Pressure ulcer of right buttock, stage 2: Secondary | ICD-10-CM | POA: Diagnosis not present

## 2017-07-06 DIAGNOSIS — M5416 Radiculopathy, lumbar region: Secondary | ICD-10-CM | POA: Diagnosis not present

## 2017-07-06 DIAGNOSIS — Z794 Long term (current) use of insulin: Secondary | ICD-10-CM | POA: Diagnosis not present

## 2017-07-06 DIAGNOSIS — M129 Arthropathy, unspecified: Secondary | ICD-10-CM | POA: Diagnosis not present

## 2017-07-06 DIAGNOSIS — E1142 Type 2 diabetes mellitus with diabetic polyneuropathy: Secondary | ICD-10-CM | POA: Diagnosis not present

## 2017-07-06 DIAGNOSIS — Z4800 Encounter for change or removal of nonsurgical wound dressing: Secondary | ICD-10-CM | POA: Diagnosis not present

## 2017-07-06 DIAGNOSIS — Z466 Encounter for fitting and adjustment of urinary device: Secondary | ICD-10-CM | POA: Diagnosis not present

## 2017-07-14 ENCOUNTER — Ambulatory Visit: Payer: Medicare Other | Admitting: Cardiology

## 2017-07-14 DIAGNOSIS — L89313 Pressure ulcer of right buttock, stage 3: Secondary | ICD-10-CM | POA: Diagnosis not present

## 2017-07-14 DIAGNOSIS — L570 Actinic keratosis: Secondary | ICD-10-CM | POA: Diagnosis not present

## 2017-07-28 DIAGNOSIS — M169 Osteoarthritis of hip, unspecified: Secondary | ICD-10-CM | POA: Diagnosis not present

## 2017-07-28 DIAGNOSIS — M47816 Spondylosis without myelopathy or radiculopathy, lumbar region: Secondary | ICD-10-CM | POA: Diagnosis not present

## 2017-07-28 DIAGNOSIS — E1142 Type 2 diabetes mellitus with diabetic polyneuropathy: Secondary | ICD-10-CM | POA: Diagnosis not present

## 2017-07-28 DIAGNOSIS — G894 Chronic pain syndrome: Secondary | ICD-10-CM | POA: Diagnosis not present

## 2017-08-02 DIAGNOSIS — L89313 Pressure ulcer of right buttock, stage 3: Secondary | ICD-10-CM | POA: Diagnosis not present

## 2017-08-02 DIAGNOSIS — L859 Epidermal thickening, unspecified: Secondary | ICD-10-CM | POA: Diagnosis not present

## 2017-08-02 DIAGNOSIS — L570 Actinic keratosis: Secondary | ICD-10-CM | POA: Diagnosis not present

## 2017-08-04 ENCOUNTER — Encounter: Payer: Self-pay | Admitting: Cardiology

## 2017-08-04 ENCOUNTER — Other Ambulatory Visit: Payer: Self-pay

## 2017-08-04 ENCOUNTER — Ambulatory Visit: Payer: Medicare Other | Admitting: Cardiology

## 2017-08-04 VITALS — BP 143/69 | HR 78 | Ht 71.0 in | Wt 291.0 lb

## 2017-08-04 DIAGNOSIS — I5033 Acute on chronic diastolic (congestive) heart failure: Secondary | ICD-10-CM

## 2017-08-04 MED ORDER — METOPROLOL SUCCINATE ER 50 MG PO TB24: ORAL_TABLET | ORAL | 1 refills | Status: AC

## 2017-08-04 MED ORDER — FUROSEMIDE 80 MG PO TABS: 80.0000 mg | ORAL_TABLET | Freq: Every day | ORAL | 1 refills | Status: DC

## 2017-08-04 NOTE — Progress Notes (Signed)
Clinical Summary Brianna Ray is a 72 y.o.female  seen today for follow up of the following medical problems.This is a focused visit on her history of LE edmea.    1. LE edema/Chronic diaastolic HF - 35/4656 echo LVEF LVEF 55-60%, grade II diastoilc dysfunction - weight had decreased from 296 to 283 lbs in 05/2017. Today trended back up to 291 lbs.  - some increase in edema. No recent SOB/DOE - some increased sodium intake - compliant with lasix, taking 60mg  daily daily.      Past Medical History:  Diagnosis Date  . Anxiety   . Cancer ([place])   . Depression   . Diabetes mellitus   . Dysrhythmia   . Fibromyalgia   . Guaiac positive stools   . Hypertension   . Neuromuscular disorder ([place])   . Reflux   . Ruptured lumbar disc    x2  . Shortness of breath   . Spinal stenosis      Allergies  Allergen Reactions  . Macrodantin [Nitrofurantoin]      Current Outpatient Medications  Medication Sig Dispense Refill  . aspirin EC 81 MG tablet Take 81 mg by mouth daily.    . cholecalciferol (VITAMIN D) 400 units TABS tablet Take 400 Units by mouth daily.    Marland Kitchen diltiazem (CARDIZEM) 60 MG tablet Take 2 tablets (120 mg total) by mouth 2 (two) times daily. 360 tablet 3  . enalapril (VASOTEC) 20 MG tablet Take 20 mg by mouth daily.    . furosemide (LASIX) 40 MG tablet Take 1.5 tablets (60 mg total) by mouth daily. 45 tablet 3  . gabapentin (NEURONTIN) 600 MG tablet Take 600 mg by mouth 3 (three) times daily.    Marland Kitchen glyBURIDE (DIABETA) 5 MG tablet Take 10 mg by mouth daily with breakfast.     . insulin glargine (LANTUS) 100 UNIT/ML injection Inject 28 Units into the skin 2 (two) times daily.     . metFORMIN (GLUCOPHAGE) 1000 MG tablet Take 1,000 mg by mouth 2 (two) times daily.     . metoprolol succinate (TOPROL-XL) 50 MG 24 hr tablet TAKE 1 TABLET BY MOUTH ONCE DAILY TAKE  WITH  OR  IMMEDIATELY  FOLLOWING  A  MEAL 90 tablet 1  . ondansetron (ZOFRAN) 8 MG tablet Take 8 mg by mouth  every 8 (eight) hours as needed for nausea or vomiting.    Marland Kitchen oxyCODONE-acetaminophen (PERCOCET) 10-325 MG tablet Take 1 tablet by mouth every 6 (six) hours as needed for pain.     . protein supplement (PROMOD) POWD Take 1 scoop by mouth 2 (two) times daily.    . vitamin B-12 (CYANOCOBALAMIN) 1000 MCG tablet Take 1,000 mcg by mouth daily.    . vitamin C (ASCORBIC ACID) 500 MG tablet Take 500 mg by mouth daily.     No current facility-administered medications for this visit.      Past Surgical History:  Procedure Laterality Date  . ABDOMINAL HYSTERECTOMY    . APPENDECTOMY    . CARPAL TUNNEL RELEASE Bilateral   . CHOLECYSTECTOMY    . COLONOSCOPY  10/21/2010   Procedure: COLONOSCOPY;  Surgeon: Rogene Houston, MD;  [facility];  Service: Endoscopy;  Laterality: N/A;  . COLONOSCOPY  10/21/2010  . ESOPHAGOGASTRODUODENOSCOPY  10/21/2010   Procedure: ESOPHAGOGASTRODUODENOSCOPY (EGD);  Surgeon: Rogene Houston, MD;  [facility];  Service: Endoscopy;  Laterality: N/A;  . EYE SURGERY    . TONSILLECTOMY  AGE  22     Allergies  Allergen Reactions  . Macrodantin [Nitrofurantoin]       No family history on file.   Social History Ms. Dunavan reports that she has quit smoking. She has never used smokeless tobacco. Ms. Goeden reports that she does not drink alcohol.   Review of Systems CONSTITUTIONAL: No weight loss, fever, chills, weakness or fatigue.  HEENT: Eyes: No visual loss, blurred vision, double vision or yellow sclerae.No hearing loss, sneezing, congestion, runny nose or sore throat.  SKIN: No rash or itching.  CARDIOVASCULAR: no chest pain, no palpitations.  RESPIRATORY: per hpi GASTROINTESTINAL: No anorexia, nausea, vomiting or diarrhea. No abdominal pain or blood.  GENITOURINARY: No burning on urination, no polyuria NEUROLOGICAL: No headache, dizziness, syncope, paralysis, ataxia, numbness or tingling in the extremities. No change in bowel or bladder  control.  MUSCULOSKELETAL: No muscle, back pain, joint pain or stiffness.  LYMPHATICS: No enlarged nodes. No history of splenectomy.  PSYCHIATRIC: No history of depression or anxiety.  ENDOCRINOLOGIC: No reports of sweating, cold or heat intolerance. No polyuria or polydipsia.  Marland Kitchen   Physical Examination Vitals:   08/04/17 1405  BP: (!) 143/69  Pulse: 78  SpO2: 94%   Vitals:   08/04/17 1405  Weight: 291 lb (132 kg)  Height: 5\' 11"  (1.803 m)    Gen: resting comfortably, no acute distress HEENT: no scleral icterus, pupils equal round and reactive, no palptable cervical adenopathy,  CV: RRR, no m/r/g, no jvd Resp: Clear to auscultation bilaterally GI: abdomen is soft, non-tender, non-distended, normal bowel sounds, no hepatosplenomegaly MSK: extremities are warm, 1+ biltaral LE edema Skin: warm, no rash Neuro:  no focal deficits Psych: appropriate affect   Diagnostic Studies - 01/2017 echo LVEF LVEF 55-60%, grade II diastoilc dysfunction     Assessment and Plan   1. LE edema/Acute on chornic diastolic HF - increased weight and edema - increase lasix to 80mg  daily, check BMET/Mg in 2 weeks.        Arnoldo Lenis, M.D.

## 2017-08-04 NOTE — Patient Instructions (Signed)
Your physician recommends that you schedule a follow-up appointment in: [place] has recommended you make the following change in your medication:   INCREASE LASIX 80 MG DAILY  Your physician recommends that you return for lab work in: 2 WEEKS BMP/MG - [place]   Thank you for choosing [facility]!!

## 2017-08-10 ENCOUNTER — Encounter (INDEPENDENT_AMBULATORY_CARE_PROVIDER_SITE_OTHER): Payer: Medicare Other | Admitting: Ophthalmology

## 2017-08-10 DIAGNOSIS — E113593 Type 2 diabetes mellitus with proliferative diabetic retinopathy without macular edema, bilateral: Secondary | ICD-10-CM

## 2017-08-10 DIAGNOSIS — L89312 Pressure ulcer of right buttock, stage 2: Secondary | ICD-10-CM | POA: Diagnosis not present

## 2017-08-10 DIAGNOSIS — Z4800 Encounter for change or removal of nonsurgical wound dressing: Secondary | ICD-10-CM | POA: Diagnosis not present

## 2017-08-10 DIAGNOSIS — R339 Retention of urine, unspecified: Secondary | ICD-10-CM | POA: Diagnosis not present

## 2017-08-10 DIAGNOSIS — I10 Essential (primary) hypertension: Secondary | ICD-10-CM

## 2017-08-10 DIAGNOSIS — Z7982 Long term (current) use of aspirin: Secondary | ICD-10-CM | POA: Diagnosis not present

## 2017-08-10 DIAGNOSIS — E1142 Type 2 diabetes mellitus with diabetic polyneuropathy: Secondary | ICD-10-CM | POA: Diagnosis not present

## 2017-08-10 DIAGNOSIS — H43813 Vitreous degeneration, bilateral: Secondary | ICD-10-CM | POA: Diagnosis not present

## 2017-08-10 DIAGNOSIS — H35033 Hypertensive retinopathy, bilateral: Secondary | ICD-10-CM | POA: Diagnosis not present

## 2017-08-10 DIAGNOSIS — Z466 Encounter for fitting and adjustment of urinary device: Secondary | ICD-10-CM | POA: Diagnosis not present

## 2017-08-10 DIAGNOSIS — Z8631 Personal history of diabetic foot ulcer: Secondary | ICD-10-CM | POA: Diagnosis not present

## 2017-08-10 DIAGNOSIS — Z79891 Long term (current) use of opiate analgesic: Secondary | ICD-10-CM | POA: Diagnosis not present

## 2017-08-10 DIAGNOSIS — E11319 Type 2 diabetes mellitus with unspecified diabetic retinopathy without macular edema: Secondary | ICD-10-CM | POA: Diagnosis not present

## 2017-08-10 DIAGNOSIS — Z794 Long term (current) use of insulin: Secondary | ICD-10-CM | POA: Diagnosis not present

## 2017-08-10 DIAGNOSIS — M5416 Radiculopathy, lumbar region: Secondary | ICD-10-CM | POA: Diagnosis not present

## 2017-08-10 DIAGNOSIS — M129 Arthropathy, unspecified: Secondary | ICD-10-CM | POA: Diagnosis not present

## 2017-08-11 DIAGNOSIS — L89313 Pressure ulcer of right buttock, stage 3: Secondary | ICD-10-CM | POA: Diagnosis not present

## 2017-08-12 DIAGNOSIS — Z7982 Long term (current) use of aspirin: Secondary | ICD-10-CM | POA: Diagnosis not present

## 2017-08-12 DIAGNOSIS — L89312 Pressure ulcer of right buttock, stage 2: Secondary | ICD-10-CM | POA: Diagnosis not present

## 2017-08-12 DIAGNOSIS — I10 Essential (primary) hypertension: Secondary | ICD-10-CM | POA: Diagnosis not present

## 2017-08-12 DIAGNOSIS — Z4800 Encounter for change or removal of nonsurgical wound dressing: Secondary | ICD-10-CM | POA: Diagnosis not present

## 2017-08-12 DIAGNOSIS — Z466 Encounter for fitting and adjustment of urinary device: Secondary | ICD-10-CM | POA: Diagnosis not present

## 2017-08-12 DIAGNOSIS — Z8631 Personal history of diabetic foot ulcer: Secondary | ICD-10-CM | POA: Diagnosis not present

## 2017-08-12 DIAGNOSIS — R339 Retention of urine, unspecified: Secondary | ICD-10-CM | POA: Diagnosis not present

## 2017-08-12 DIAGNOSIS — M129 Arthropathy, unspecified: Secondary | ICD-10-CM | POA: Diagnosis not present

## 2017-08-12 DIAGNOSIS — Z79891 Long term (current) use of opiate analgesic: Secondary | ICD-10-CM | POA: Diagnosis not present

## 2017-08-12 DIAGNOSIS — E1142 Type 2 diabetes mellitus with diabetic polyneuropathy: Secondary | ICD-10-CM | POA: Diagnosis not present

## 2017-08-12 DIAGNOSIS — Z794 Long term (current) use of insulin: Secondary | ICD-10-CM | POA: Diagnosis not present

## 2017-08-12 DIAGNOSIS — M5416 Radiculopathy, lumbar region: Secondary | ICD-10-CM | POA: Diagnosis not present

## 2017-08-13 ENCOUNTER — Encounter: Payer: Self-pay | Admitting: Cardiology

## 2017-08-16 DIAGNOSIS — Z794 Long term (current) use of insulin: Secondary | ICD-10-CM | POA: Diagnosis not present

## 2017-08-16 DIAGNOSIS — Z8631 Personal history of diabetic foot ulcer: Secondary | ICD-10-CM | POA: Diagnosis not present

## 2017-08-16 DIAGNOSIS — Z4800 Encounter for change or removal of nonsurgical wound dressing: Secondary | ICD-10-CM | POA: Diagnosis not present

## 2017-08-16 DIAGNOSIS — L89312 Pressure ulcer of right buttock, stage 2: Secondary | ICD-10-CM | POA: Diagnosis not present

## 2017-08-16 DIAGNOSIS — E1142 Type 2 diabetes mellitus with diabetic polyneuropathy: Secondary | ICD-10-CM | POA: Diagnosis not present

## 2017-08-16 DIAGNOSIS — R339 Retention of urine, unspecified: Secondary | ICD-10-CM | POA: Diagnosis not present

## 2017-08-16 DIAGNOSIS — Z79891 Long term (current) use of opiate analgesic: Secondary | ICD-10-CM | POA: Diagnosis not present

## 2017-08-16 DIAGNOSIS — Z466 Encounter for fitting and adjustment of urinary device: Secondary | ICD-10-CM | POA: Diagnosis not present

## 2017-08-16 DIAGNOSIS — I10 Essential (primary) hypertension: Secondary | ICD-10-CM | POA: Diagnosis not present

## 2017-08-16 DIAGNOSIS — M129 Arthropathy, unspecified: Secondary | ICD-10-CM | POA: Diagnosis not present

## 2017-08-16 DIAGNOSIS — M5416 Radiculopathy, lumbar region: Secondary | ICD-10-CM | POA: Diagnosis not present

## 2017-08-16 DIAGNOSIS — Z7982 Long term (current) use of aspirin: Secondary | ICD-10-CM | POA: Diagnosis not present

## 2017-08-18 DIAGNOSIS — E1142 Type 2 diabetes mellitus with diabetic polyneuropathy: Secondary | ICD-10-CM | POA: Diagnosis not present

## 2017-08-18 DIAGNOSIS — Z8631 Personal history of diabetic foot ulcer: Secondary | ICD-10-CM | POA: Diagnosis not present

## 2017-08-18 DIAGNOSIS — Z466 Encounter for fitting and adjustment of urinary device: Secondary | ICD-10-CM | POA: Diagnosis not present

## 2017-08-18 DIAGNOSIS — R339 Retention of urine, unspecified: Secondary | ICD-10-CM | POA: Diagnosis not present

## 2017-08-18 DIAGNOSIS — M5416 Radiculopathy, lumbar region: Secondary | ICD-10-CM | POA: Diagnosis not present

## 2017-08-18 DIAGNOSIS — Z7982 Long term (current) use of aspirin: Secondary | ICD-10-CM | POA: Diagnosis not present

## 2017-08-18 DIAGNOSIS — L89312 Pressure ulcer of right buttock, stage 2: Secondary | ICD-10-CM | POA: Diagnosis not present

## 2017-08-18 DIAGNOSIS — Z79891 Long term (current) use of opiate analgesic: Secondary | ICD-10-CM | POA: Diagnosis not present

## 2017-08-18 DIAGNOSIS — Z794 Long term (current) use of insulin: Secondary | ICD-10-CM | POA: Diagnosis not present

## 2017-08-18 DIAGNOSIS — M129 Arthropathy, unspecified: Secondary | ICD-10-CM | POA: Diagnosis not present

## 2017-08-18 DIAGNOSIS — I10 Essential (primary) hypertension: Secondary | ICD-10-CM | POA: Diagnosis not present

## 2017-08-18 DIAGNOSIS — N390 Urinary tract infection, site not specified: Secondary | ICD-10-CM | POA: Diagnosis not present

## 2017-08-18 DIAGNOSIS — Z4800 Encounter for change or removal of nonsurgical wound dressing: Secondary | ICD-10-CM | POA: Diagnosis not present

## 2017-08-22 DIAGNOSIS — Z79891 Long term (current) use of opiate analgesic: Secondary | ICD-10-CM | POA: Diagnosis not present

## 2017-08-22 DIAGNOSIS — L89312 Pressure ulcer of right buttock, stage 2: Secondary | ICD-10-CM | POA: Diagnosis not present

## 2017-08-22 DIAGNOSIS — R339 Retention of urine, unspecified: Secondary | ICD-10-CM | POA: Diagnosis not present

## 2017-08-22 DIAGNOSIS — M129 Arthropathy, unspecified: Secondary | ICD-10-CM | POA: Diagnosis not present

## 2017-08-22 DIAGNOSIS — Z8631 Personal history of diabetic foot ulcer: Secondary | ICD-10-CM | POA: Diagnosis not present

## 2017-08-22 DIAGNOSIS — E1142 Type 2 diabetes mellitus with diabetic polyneuropathy: Secondary | ICD-10-CM | POA: Diagnosis not present

## 2017-08-22 DIAGNOSIS — Z794 Long term (current) use of insulin: Secondary | ICD-10-CM | POA: Diagnosis not present

## 2017-08-22 DIAGNOSIS — M5416 Radiculopathy, lumbar region: Secondary | ICD-10-CM | POA: Diagnosis not present

## 2017-08-22 DIAGNOSIS — Z466 Encounter for fitting and adjustment of urinary device: Secondary | ICD-10-CM | POA: Diagnosis not present

## 2017-08-22 DIAGNOSIS — Z4800 Encounter for change or removal of nonsurgical wound dressing: Secondary | ICD-10-CM | POA: Diagnosis not present

## 2017-08-22 DIAGNOSIS — Z7982 Long term (current) use of aspirin: Secondary | ICD-10-CM | POA: Diagnosis not present

## 2017-08-22 DIAGNOSIS — I10 Essential (primary) hypertension: Secondary | ICD-10-CM | POA: Diagnosis not present

## 2017-08-26 DIAGNOSIS — I10 Essential (primary) hypertension: Secondary | ICD-10-CM | POA: Diagnosis not present

## 2017-08-30 DIAGNOSIS — I10 Essential (primary) hypertension: Secondary | ICD-10-CM | POA: Diagnosis not present

## 2017-08-30 DIAGNOSIS — Z8631 Personal history of diabetic foot ulcer: Secondary | ICD-10-CM | POA: Diagnosis not present

## 2017-08-30 DIAGNOSIS — Z79891 Long term (current) use of opiate analgesic: Secondary | ICD-10-CM | POA: Diagnosis not present

## 2017-08-30 DIAGNOSIS — Z7982 Long term (current) use of aspirin: Secondary | ICD-10-CM | POA: Diagnosis not present

## 2017-08-30 DIAGNOSIS — Z794 Long term (current) use of insulin: Secondary | ICD-10-CM | POA: Diagnosis not present

## 2017-08-30 DIAGNOSIS — R339 Retention of urine, unspecified: Secondary | ICD-10-CM | POA: Diagnosis not present

## 2017-08-30 DIAGNOSIS — Z4800 Encounter for change or removal of nonsurgical wound dressing: Secondary | ICD-10-CM | POA: Diagnosis not present

## 2017-08-30 DIAGNOSIS — M5416 Radiculopathy, lumbar region: Secondary | ICD-10-CM | POA: Diagnosis not present

## 2017-08-30 DIAGNOSIS — E1142 Type 2 diabetes mellitus with diabetic polyneuropathy: Secondary | ICD-10-CM | POA: Diagnosis not present

## 2017-08-30 DIAGNOSIS — M129 Arthropathy, unspecified: Secondary | ICD-10-CM | POA: Diagnosis not present

## 2017-08-30 DIAGNOSIS — L89312 Pressure ulcer of right buttock, stage 2: Secondary | ICD-10-CM | POA: Diagnosis not present

## 2017-08-30 DIAGNOSIS — Z466 Encounter for fitting and adjustment of urinary device: Secondary | ICD-10-CM | POA: Diagnosis not present

## 2017-09-01 DIAGNOSIS — G894 Chronic pain syndrome: Secondary | ICD-10-CM | POA: Diagnosis not present

## 2017-09-01 DIAGNOSIS — M47816 Spondylosis without myelopathy or radiculopathy, lumbar region: Secondary | ICD-10-CM | POA: Diagnosis not present

## 2017-09-01 DIAGNOSIS — Z79891 Long term (current) use of opiate analgesic: Secondary | ICD-10-CM | POA: Diagnosis not present

## 2017-09-01 DIAGNOSIS — M169 Osteoarthritis of hip, unspecified: Secondary | ICD-10-CM | POA: Diagnosis not present

## 2017-09-01 DIAGNOSIS — E1142 Type 2 diabetes mellitus with diabetic polyneuropathy: Secondary | ICD-10-CM | POA: Diagnosis not present

## 2017-09-01 DIAGNOSIS — Z79899 Other long term (current) drug therapy: Secondary | ICD-10-CM | POA: Diagnosis not present

## 2017-09-02 DIAGNOSIS — R339 Retention of urine, unspecified: Secondary | ICD-10-CM | POA: Diagnosis not present

## 2017-09-02 DIAGNOSIS — Z466 Encounter for fitting and adjustment of urinary device: Secondary | ICD-10-CM | POA: Diagnosis not present

## 2017-09-06 ENCOUNTER — Telehealth: Payer: Self-pay | Admitting: *Deleted

## 2017-09-06 DIAGNOSIS — M129 Arthropathy, unspecified: Secondary | ICD-10-CM | POA: Diagnosis not present

## 2017-09-06 DIAGNOSIS — Z7982 Long term (current) use of aspirin: Secondary | ICD-10-CM | POA: Diagnosis not present

## 2017-09-06 DIAGNOSIS — Z466 Encounter for fitting and adjustment of urinary device: Secondary | ICD-10-CM | POA: Diagnosis not present

## 2017-09-06 DIAGNOSIS — L89312 Pressure ulcer of right buttock, stage 2: Secondary | ICD-10-CM | POA: Diagnosis not present

## 2017-09-06 DIAGNOSIS — M5416 Radiculopathy, lumbar region: Secondary | ICD-10-CM | POA: Diagnosis not present

## 2017-09-06 DIAGNOSIS — Z794 Long term (current) use of insulin: Secondary | ICD-10-CM | POA: Diagnosis not present

## 2017-09-06 DIAGNOSIS — E1142 Type 2 diabetes mellitus with diabetic polyneuropathy: Secondary | ICD-10-CM | POA: Diagnosis not present

## 2017-09-06 DIAGNOSIS — Z4800 Encounter for change or removal of nonsurgical wound dressing: Secondary | ICD-10-CM | POA: Diagnosis not present

## 2017-09-06 DIAGNOSIS — Z8631 Personal history of diabetic foot ulcer: Secondary | ICD-10-CM | POA: Diagnosis not present

## 2017-09-06 DIAGNOSIS — I10 Essential (primary) hypertension: Secondary | ICD-10-CM | POA: Diagnosis not present

## 2017-09-06 DIAGNOSIS — R339 Retention of urine, unspecified: Secondary | ICD-10-CM | POA: Diagnosis not present

## 2017-09-06 DIAGNOSIS — Z79891 Long term (current) use of opiate analgesic: Secondary | ICD-10-CM | POA: Diagnosis not present

## 2017-09-06 MED ORDER — FUROSEMIDE 80 MG PO TABS: ORAL_TABLET | ORAL | 1 refills | Status: DC

## 2017-09-06 MED ORDER — MAGNESIUM OXIDE 400 MG PO TABS: ORAL_TABLET | ORAL | 1 refills | Status: AC

## 2017-09-06 MED ORDER — FUROSEMIDE 20 MG PO TABS: ORAL_TABLET | ORAL | 1 refills | Status: DC

## 2017-09-06 NOTE — Telephone Encounter (Signed)
Pt aware and voiced understanding - requesting lasix 20 mg be sent in and pt would take 3 tablets (60mg ) and she doesn't like to have to cut pills - pt has lasxi 80 mg and doesn't want this sent in again - routed to pcp

## 2017-09-06 NOTE — Telephone Encounter (Signed)
-----   Message from Arnoldo Lenis, MD sent at 09/06/2017  2:49 PM EDT ----- Mild decrase in kidney function. Would decrease lasix to 80mg  alternating days with 60mg . Low magnesium, start magnesium oxide 400mg  bid x 4 days, then 400mg  daily   Zandra Abts MD

## 2017-09-08 DIAGNOSIS — L89313 Pressure ulcer of right buttock, stage 3: Secondary | ICD-10-CM | POA: Diagnosis not present

## 2017-09-12 DIAGNOSIS — Z4800 Encounter for change or removal of nonsurgical wound dressing: Secondary | ICD-10-CM | POA: Diagnosis not present

## 2017-09-12 DIAGNOSIS — M5416 Radiculopathy, lumbar region: Secondary | ICD-10-CM | POA: Diagnosis not present

## 2017-09-12 DIAGNOSIS — I10 Essential (primary) hypertension: Secondary | ICD-10-CM | POA: Diagnosis not present

## 2017-09-12 DIAGNOSIS — Z794 Long term (current) use of insulin: Secondary | ICD-10-CM | POA: Diagnosis not present

## 2017-09-12 DIAGNOSIS — Z7982 Long term (current) use of aspirin: Secondary | ICD-10-CM | POA: Diagnosis not present

## 2017-09-12 DIAGNOSIS — Z8631 Personal history of diabetic foot ulcer: Secondary | ICD-10-CM | POA: Diagnosis not present

## 2017-09-12 DIAGNOSIS — M129 Arthropathy, unspecified: Secondary | ICD-10-CM | POA: Diagnosis not present

## 2017-09-12 DIAGNOSIS — R339 Retention of urine, unspecified: Secondary | ICD-10-CM | POA: Diagnosis not present

## 2017-09-12 DIAGNOSIS — Z79891 Long term (current) use of opiate analgesic: Secondary | ICD-10-CM | POA: Diagnosis not present

## 2017-09-12 DIAGNOSIS — L89312 Pressure ulcer of right buttock, stage 2: Secondary | ICD-10-CM | POA: Diagnosis not present

## 2017-09-12 DIAGNOSIS — Z466 Encounter for fitting and adjustment of urinary device: Secondary | ICD-10-CM | POA: Diagnosis not present

## 2017-09-12 DIAGNOSIS — E1142 Type 2 diabetes mellitus with diabetic polyneuropathy: Secondary | ICD-10-CM | POA: Diagnosis not present

## 2017-09-13 DIAGNOSIS — Z466 Encounter for fitting and adjustment of urinary device: Secondary | ICD-10-CM | POA: Diagnosis not present

## 2017-09-13 DIAGNOSIS — E1142 Type 2 diabetes mellitus with diabetic polyneuropathy: Secondary | ICD-10-CM | POA: Diagnosis not present

## 2017-09-13 DIAGNOSIS — Z7982 Long term (current) use of aspirin: Secondary | ICD-10-CM | POA: Diagnosis not present

## 2017-09-13 DIAGNOSIS — M5416 Radiculopathy, lumbar region: Secondary | ICD-10-CM | POA: Diagnosis not present

## 2017-09-13 DIAGNOSIS — Z794 Long term (current) use of insulin: Secondary | ICD-10-CM | POA: Diagnosis not present

## 2017-09-13 DIAGNOSIS — I10 Essential (primary) hypertension: Secondary | ICD-10-CM | POA: Diagnosis not present

## 2017-09-13 DIAGNOSIS — L89312 Pressure ulcer of right buttock, stage 2: Secondary | ICD-10-CM | POA: Diagnosis not present

## 2017-09-13 DIAGNOSIS — Z8631 Personal history of diabetic foot ulcer: Secondary | ICD-10-CM | POA: Diagnosis not present

## 2017-09-13 DIAGNOSIS — Z4800 Encounter for change or removal of nonsurgical wound dressing: Secondary | ICD-10-CM | POA: Diagnosis not present

## 2017-09-13 DIAGNOSIS — R339 Retention of urine, unspecified: Secondary | ICD-10-CM | POA: Diagnosis not present

## 2017-09-13 DIAGNOSIS — Z79891 Long term (current) use of opiate analgesic: Secondary | ICD-10-CM | POA: Diagnosis not present

## 2017-09-13 DIAGNOSIS — M129 Arthropathy, unspecified: Secondary | ICD-10-CM | POA: Diagnosis not present

## 2017-09-14 DIAGNOSIS — M129 Arthropathy, unspecified: Secondary | ICD-10-CM | POA: Diagnosis not present

## 2017-09-14 DIAGNOSIS — Z4800 Encounter for change or removal of nonsurgical wound dressing: Secondary | ICD-10-CM | POA: Diagnosis not present

## 2017-09-14 DIAGNOSIS — E1142 Type 2 diabetes mellitus with diabetic polyneuropathy: Secondary | ICD-10-CM | POA: Diagnosis not present

## 2017-09-14 DIAGNOSIS — Z8631 Personal history of diabetic foot ulcer: Secondary | ICD-10-CM | POA: Diagnosis not present

## 2017-09-14 DIAGNOSIS — M5416 Radiculopathy, lumbar region: Secondary | ICD-10-CM | POA: Diagnosis not present

## 2017-09-14 DIAGNOSIS — R339 Retention of urine, unspecified: Secondary | ICD-10-CM | POA: Diagnosis not present

## 2017-09-14 DIAGNOSIS — Z466 Encounter for fitting and adjustment of urinary device: Secondary | ICD-10-CM | POA: Diagnosis not present

## 2017-09-14 DIAGNOSIS — Z794 Long term (current) use of insulin: Secondary | ICD-10-CM | POA: Diagnosis not present

## 2017-09-14 DIAGNOSIS — Z79891 Long term (current) use of opiate analgesic: Secondary | ICD-10-CM | POA: Diagnosis not present

## 2017-09-14 DIAGNOSIS — I10 Essential (primary) hypertension: Secondary | ICD-10-CM | POA: Diagnosis not present

## 2017-09-14 DIAGNOSIS — Z7982 Long term (current) use of aspirin: Secondary | ICD-10-CM | POA: Diagnosis not present

## 2017-09-14 DIAGNOSIS — L89312 Pressure ulcer of right buttock, stage 2: Secondary | ICD-10-CM | POA: Diagnosis not present

## 2017-09-16 DIAGNOSIS — R339 Retention of urine, unspecified: Secondary | ICD-10-CM | POA: Diagnosis not present

## 2017-09-16 DIAGNOSIS — M129 Arthropathy, unspecified: Secondary | ICD-10-CM | POA: Diagnosis not present

## 2017-09-16 DIAGNOSIS — Z466 Encounter for fitting and adjustment of urinary device: Secondary | ICD-10-CM | POA: Diagnosis not present

## 2017-09-16 DIAGNOSIS — Z8631 Personal history of diabetic foot ulcer: Secondary | ICD-10-CM | POA: Diagnosis not present

## 2017-09-16 DIAGNOSIS — M5416 Radiculopathy, lumbar region: Secondary | ICD-10-CM | POA: Diagnosis not present

## 2017-09-16 DIAGNOSIS — E1142 Type 2 diabetes mellitus with diabetic polyneuropathy: Secondary | ICD-10-CM | POA: Diagnosis not present

## 2017-09-16 DIAGNOSIS — Z794 Long term (current) use of insulin: Secondary | ICD-10-CM | POA: Diagnosis not present

## 2017-09-16 DIAGNOSIS — L89312 Pressure ulcer of right buttock, stage 2: Secondary | ICD-10-CM | POA: Diagnosis not present

## 2017-09-16 DIAGNOSIS — Z79891 Long term (current) use of opiate analgesic: Secondary | ICD-10-CM | POA: Diagnosis not present

## 2017-09-16 DIAGNOSIS — Z7982 Long term (current) use of aspirin: Secondary | ICD-10-CM | POA: Diagnosis not present

## 2017-09-16 DIAGNOSIS — I10 Essential (primary) hypertension: Secondary | ICD-10-CM | POA: Diagnosis not present

## 2017-09-16 DIAGNOSIS — Z4800 Encounter for change or removal of nonsurgical wound dressing: Secondary | ICD-10-CM | POA: Diagnosis not present

## 2017-09-19 DIAGNOSIS — R339 Retention of urine, unspecified: Secondary | ICD-10-CM | POA: Diagnosis not present

## 2017-09-19 DIAGNOSIS — Z4800 Encounter for change or removal of nonsurgical wound dressing: Secondary | ICD-10-CM | POA: Diagnosis not present

## 2017-09-19 DIAGNOSIS — Z7982 Long term (current) use of aspirin: Secondary | ICD-10-CM | POA: Diagnosis not present

## 2017-09-19 DIAGNOSIS — I10 Essential (primary) hypertension: Secondary | ICD-10-CM | POA: Diagnosis not present

## 2017-09-19 DIAGNOSIS — Z794 Long term (current) use of insulin: Secondary | ICD-10-CM | POA: Diagnosis not present

## 2017-09-19 DIAGNOSIS — M129 Arthropathy, unspecified: Secondary | ICD-10-CM | POA: Diagnosis not present

## 2017-09-19 DIAGNOSIS — Z79891 Long term (current) use of opiate analgesic: Secondary | ICD-10-CM | POA: Diagnosis not present

## 2017-09-19 DIAGNOSIS — E1142 Type 2 diabetes mellitus with diabetic polyneuropathy: Secondary | ICD-10-CM | POA: Diagnosis not present

## 2017-09-19 DIAGNOSIS — L89312 Pressure ulcer of right buttock, stage 2: Secondary | ICD-10-CM | POA: Diagnosis not present

## 2017-09-19 DIAGNOSIS — Z8631 Personal history of diabetic foot ulcer: Secondary | ICD-10-CM | POA: Diagnosis not present

## 2017-09-19 DIAGNOSIS — M5416 Radiculopathy, lumbar region: Secondary | ICD-10-CM | POA: Diagnosis not present

## 2017-09-19 DIAGNOSIS — Z466 Encounter for fitting and adjustment of urinary device: Secondary | ICD-10-CM | POA: Diagnosis not present

## 2017-09-21 DIAGNOSIS — M129 Arthropathy, unspecified: Secondary | ICD-10-CM | POA: Diagnosis not present

## 2017-09-21 DIAGNOSIS — Z79891 Long term (current) use of opiate analgesic: Secondary | ICD-10-CM | POA: Diagnosis not present

## 2017-09-21 DIAGNOSIS — Z794 Long term (current) use of insulin: Secondary | ICD-10-CM | POA: Diagnosis not present

## 2017-09-21 DIAGNOSIS — E1142 Type 2 diabetes mellitus with diabetic polyneuropathy: Secondary | ICD-10-CM | POA: Diagnosis not present

## 2017-09-21 DIAGNOSIS — Z8631 Personal history of diabetic foot ulcer: Secondary | ICD-10-CM | POA: Diagnosis not present

## 2017-09-21 DIAGNOSIS — M5416 Radiculopathy, lumbar region: Secondary | ICD-10-CM | POA: Diagnosis not present

## 2017-09-21 DIAGNOSIS — Z7982 Long term (current) use of aspirin: Secondary | ICD-10-CM | POA: Diagnosis not present

## 2017-09-21 DIAGNOSIS — Z4800 Encounter for change or removal of nonsurgical wound dressing: Secondary | ICD-10-CM | POA: Diagnosis not present

## 2017-09-21 DIAGNOSIS — I10 Essential (primary) hypertension: Secondary | ICD-10-CM | POA: Diagnosis not present

## 2017-09-21 DIAGNOSIS — L89312 Pressure ulcer of right buttock, stage 2: Secondary | ICD-10-CM | POA: Diagnosis not present

## 2017-09-21 DIAGNOSIS — R339 Retention of urine, unspecified: Secondary | ICD-10-CM | POA: Diagnosis not present

## 2017-09-21 DIAGNOSIS — Z466 Encounter for fitting and adjustment of urinary device: Secondary | ICD-10-CM | POA: Diagnosis not present

## 2017-09-22 DIAGNOSIS — L89313 Pressure ulcer of right buttock, stage 3: Secondary | ICD-10-CM | POA: Diagnosis not present

## 2017-09-23 ENCOUNTER — Other Ambulatory Visit (HOSPITAL_COMMUNITY)
Admission: RE | Admit: 2017-09-23 | Discharge: 2017-09-23 | Disposition: A | Discharge status: Home / Self Care | Payer: Medicare Other | Source: Other Acute Inpatient Hospital | Attending: Family Medicine | Admitting: Family Medicine

## 2017-09-23 DIAGNOSIS — M5416 Radiculopathy, lumbar region: Secondary | ICD-10-CM | POA: Diagnosis not present

## 2017-09-23 DIAGNOSIS — N390 Urinary tract infection, site not specified: Secondary | ICD-10-CM | POA: Insufficient documentation

## 2017-09-23 DIAGNOSIS — Z79891 Long term (current) use of opiate analgesic: Secondary | ICD-10-CM | POA: Diagnosis not present

## 2017-09-23 DIAGNOSIS — R339 Retention of urine, unspecified: Secondary | ICD-10-CM | POA: Diagnosis not present

## 2017-09-23 DIAGNOSIS — L89312 Pressure ulcer of right buttock, stage 2: Secondary | ICD-10-CM | POA: Diagnosis not present

## 2017-09-23 DIAGNOSIS — Z4800 Encounter for change or removal of nonsurgical wound dressing: Secondary | ICD-10-CM | POA: Diagnosis not present

## 2017-09-23 DIAGNOSIS — Z8631 Personal history of diabetic foot ulcer: Secondary | ICD-10-CM | POA: Diagnosis not present

## 2017-09-23 DIAGNOSIS — Z794 Long term (current) use of insulin: Secondary | ICD-10-CM | POA: Diagnosis not present

## 2017-09-23 DIAGNOSIS — E1142 Type 2 diabetes mellitus with diabetic polyneuropathy: Secondary | ICD-10-CM | POA: Diagnosis not present

## 2017-09-23 DIAGNOSIS — Z466 Encounter for fitting and adjustment of urinary device: Secondary | ICD-10-CM | POA: Diagnosis not present

## 2017-09-23 DIAGNOSIS — M129 Arthropathy, unspecified: Secondary | ICD-10-CM | POA: Diagnosis not present

## 2017-09-23 DIAGNOSIS — Z7982 Long term (current) use of aspirin: Secondary | ICD-10-CM | POA: Diagnosis not present

## 2017-09-23 DIAGNOSIS — I10 Essential (primary) hypertension: Secondary | ICD-10-CM | POA: Diagnosis not present

## 2017-09-23 LAB — URINALYSIS, COMPLETE (UACMP) WITH MICROSCOPIC
BILIRUBIN URINE: NEGATIVE
HGB URINE DIPSTICK: NEGATIVE
KETONES UR: NEGATIVE mg/dL
Nitrite: NEGATIVE
PH: 5 (ref 5.0–8.0)
PROTEIN: 30 mg/dL — AB
Specific Gravity, Urine: 1.02 (ref 1.005–1.030)

## 2017-09-25 LAB — URINE CULTURE: Culture: >=100000 — AB

## 2017-09-26 DIAGNOSIS — Z7982 Long term (current) use of aspirin: Secondary | ICD-10-CM | POA: Diagnosis not present

## 2017-09-26 DIAGNOSIS — E1142 Type 2 diabetes mellitus with diabetic polyneuropathy: Secondary | ICD-10-CM | POA: Diagnosis not present

## 2017-09-26 DIAGNOSIS — L89312 Pressure ulcer of right buttock, stage 2: Secondary | ICD-10-CM | POA: Diagnosis not present

## 2017-09-26 DIAGNOSIS — Z794 Long term (current) use of insulin: Secondary | ICD-10-CM | POA: Diagnosis not present

## 2017-09-26 DIAGNOSIS — M129 Arthropathy, unspecified: Secondary | ICD-10-CM | POA: Diagnosis not present

## 2017-09-26 DIAGNOSIS — R339 Retention of urine, unspecified: Secondary | ICD-10-CM | POA: Diagnosis not present

## 2017-09-26 DIAGNOSIS — Z4800 Encounter for change or removal of nonsurgical wound dressing: Secondary | ICD-10-CM | POA: Diagnosis not present

## 2017-09-26 DIAGNOSIS — Z79891 Long term (current) use of opiate analgesic: Secondary | ICD-10-CM | POA: Diagnosis not present

## 2017-09-26 DIAGNOSIS — M5416 Radiculopathy, lumbar region: Secondary | ICD-10-CM | POA: Diagnosis not present

## 2017-09-26 DIAGNOSIS — Z8631 Personal history of diabetic foot ulcer: Secondary | ICD-10-CM | POA: Diagnosis not present

## 2017-09-26 DIAGNOSIS — I10 Essential (primary) hypertension: Secondary | ICD-10-CM | POA: Diagnosis not present

## 2017-09-26 DIAGNOSIS — Z466 Encounter for fitting and adjustment of urinary device: Secondary | ICD-10-CM | POA: Diagnosis not present

## 2017-09-28 DIAGNOSIS — L89312 Pressure ulcer of right buttock, stage 2: Secondary | ICD-10-CM | POA: Diagnosis not present

## 2017-09-28 DIAGNOSIS — Z4800 Encounter for change or removal of nonsurgical wound dressing: Secondary | ICD-10-CM | POA: Diagnosis not present

## 2017-09-28 DIAGNOSIS — M129 Arthropathy, unspecified: Secondary | ICD-10-CM | POA: Diagnosis not present

## 2017-09-28 DIAGNOSIS — E1142 Type 2 diabetes mellitus with diabetic polyneuropathy: Secondary | ICD-10-CM | POA: Diagnosis not present

## 2017-09-28 DIAGNOSIS — Z7982 Long term (current) use of aspirin: Secondary | ICD-10-CM | POA: Diagnosis not present

## 2017-09-28 DIAGNOSIS — Z79891 Long term (current) use of opiate analgesic: Secondary | ICD-10-CM | POA: Diagnosis not present

## 2017-09-28 DIAGNOSIS — Z794 Long term (current) use of insulin: Secondary | ICD-10-CM | POA: Diagnosis not present

## 2017-09-28 DIAGNOSIS — R339 Retention of urine, unspecified: Secondary | ICD-10-CM | POA: Diagnosis not present

## 2017-09-28 DIAGNOSIS — Z466 Encounter for fitting and adjustment of urinary device: Secondary | ICD-10-CM | POA: Diagnosis not present

## 2017-09-28 DIAGNOSIS — M5416 Radiculopathy, lumbar region: Secondary | ICD-10-CM | POA: Diagnosis not present

## 2017-09-28 DIAGNOSIS — Z8631 Personal history of diabetic foot ulcer: Secondary | ICD-10-CM | POA: Diagnosis not present

## 2017-09-28 DIAGNOSIS — I10 Essential (primary) hypertension: Secondary | ICD-10-CM | POA: Diagnosis not present

## 2017-09-29 DIAGNOSIS — M47816 Spondylosis without myelopathy or radiculopathy, lumbar region: Secondary | ICD-10-CM | POA: Diagnosis not present

## 2017-09-29 DIAGNOSIS — M169 Osteoarthritis of hip, unspecified: Secondary | ICD-10-CM | POA: Diagnosis not present

## 2017-09-29 DIAGNOSIS — G894 Chronic pain syndrome: Secondary | ICD-10-CM | POA: Diagnosis not present

## 2017-09-30 DIAGNOSIS — E1142 Type 2 diabetes mellitus with diabetic polyneuropathy: Secondary | ICD-10-CM | POA: Diagnosis not present

## 2017-09-30 DIAGNOSIS — R339 Retention of urine, unspecified: Secondary | ICD-10-CM | POA: Diagnosis not present

## 2017-09-30 DIAGNOSIS — I10 Essential (primary) hypertension: Secondary | ICD-10-CM | POA: Diagnosis not present

## 2017-09-30 DIAGNOSIS — L89312 Pressure ulcer of right buttock, stage 2: Secondary | ICD-10-CM | POA: Diagnosis not present

## 2017-09-30 DIAGNOSIS — Z466 Encounter for fitting and adjustment of urinary device: Secondary | ICD-10-CM | POA: Diagnosis not present

## 2017-09-30 DIAGNOSIS — Z794 Long term (current) use of insulin: Secondary | ICD-10-CM | POA: Diagnosis not present

## 2017-09-30 DIAGNOSIS — Z7982 Long term (current) use of aspirin: Secondary | ICD-10-CM | POA: Diagnosis not present

## 2017-09-30 DIAGNOSIS — M129 Arthropathy, unspecified: Secondary | ICD-10-CM | POA: Diagnosis not present

## 2017-09-30 DIAGNOSIS — M5416 Radiculopathy, lumbar region: Secondary | ICD-10-CM | POA: Diagnosis not present

## 2017-09-30 DIAGNOSIS — Z8631 Personal history of diabetic foot ulcer: Secondary | ICD-10-CM | POA: Diagnosis not present

## 2017-09-30 DIAGNOSIS — Z4800 Encounter for change or removal of nonsurgical wound dressing: Secondary | ICD-10-CM | POA: Diagnosis not present

## 2017-09-30 DIAGNOSIS — Z79891 Long term (current) use of opiate analgesic: Secondary | ICD-10-CM | POA: Diagnosis not present

## 2017-10-03 ENCOUNTER — Other Ambulatory Visit: Payer: Self-pay | Admitting: Cardiology

## 2017-10-03 DIAGNOSIS — Z4800 Encounter for change or removal of nonsurgical wound dressing: Secondary | ICD-10-CM | POA: Diagnosis not present

## 2017-10-03 DIAGNOSIS — Z79891 Long term (current) use of opiate analgesic: Secondary | ICD-10-CM | POA: Diagnosis not present

## 2017-10-03 DIAGNOSIS — M129 Arthropathy, unspecified: Secondary | ICD-10-CM | POA: Diagnosis not present

## 2017-10-03 DIAGNOSIS — L89312 Pressure ulcer of right buttock, stage 2: Secondary | ICD-10-CM | POA: Diagnosis not present

## 2017-10-03 DIAGNOSIS — M5416 Radiculopathy, lumbar region: Secondary | ICD-10-CM | POA: Diagnosis not present

## 2017-10-03 DIAGNOSIS — E1142 Type 2 diabetes mellitus with diabetic polyneuropathy: Secondary | ICD-10-CM | POA: Diagnosis not present

## 2017-10-03 DIAGNOSIS — Z8631 Personal history of diabetic foot ulcer: Secondary | ICD-10-CM | POA: Diagnosis not present

## 2017-10-03 DIAGNOSIS — R339 Retention of urine, unspecified: Secondary | ICD-10-CM | POA: Diagnosis not present

## 2017-10-03 DIAGNOSIS — I10 Essential (primary) hypertension: Secondary | ICD-10-CM | POA: Diagnosis not present

## 2017-10-03 DIAGNOSIS — Z794 Long term (current) use of insulin: Secondary | ICD-10-CM | POA: Diagnosis not present

## 2017-10-03 DIAGNOSIS — Z466 Encounter for fitting and adjustment of urinary device: Secondary | ICD-10-CM | POA: Diagnosis not present

## 2017-10-03 DIAGNOSIS — Z7982 Long term (current) use of aspirin: Secondary | ICD-10-CM | POA: Diagnosis not present

## 2017-10-05 DIAGNOSIS — Z794 Long term (current) use of insulin: Secondary | ICD-10-CM | POA: Diagnosis not present

## 2017-10-05 DIAGNOSIS — M5416 Radiculopathy, lumbar region: Secondary | ICD-10-CM | POA: Diagnosis not present

## 2017-10-05 DIAGNOSIS — Z8631 Personal history of diabetic foot ulcer: Secondary | ICD-10-CM | POA: Diagnosis not present

## 2017-10-05 DIAGNOSIS — M129 Arthropathy, unspecified: Secondary | ICD-10-CM | POA: Diagnosis not present

## 2017-10-05 DIAGNOSIS — I10 Essential (primary) hypertension: Secondary | ICD-10-CM | POA: Diagnosis not present

## 2017-10-05 DIAGNOSIS — E1142 Type 2 diabetes mellitus with diabetic polyneuropathy: Secondary | ICD-10-CM | POA: Diagnosis not present

## 2017-10-05 DIAGNOSIS — Z79891 Long term (current) use of opiate analgesic: Secondary | ICD-10-CM | POA: Diagnosis not present

## 2017-10-05 DIAGNOSIS — Z466 Encounter for fitting and adjustment of urinary device: Secondary | ICD-10-CM | POA: Diagnosis not present

## 2017-10-05 DIAGNOSIS — Z7982 Long term (current) use of aspirin: Secondary | ICD-10-CM | POA: Diagnosis not present

## 2017-10-05 DIAGNOSIS — L89312 Pressure ulcer of right buttock, stage 2: Secondary | ICD-10-CM | POA: Diagnosis not present

## 2017-10-05 DIAGNOSIS — R339 Retention of urine, unspecified: Secondary | ICD-10-CM | POA: Diagnosis not present

## 2017-10-05 DIAGNOSIS — Z4800 Encounter for change or removal of nonsurgical wound dressing: Secondary | ICD-10-CM | POA: Diagnosis not present

## 2017-10-07 DIAGNOSIS — I10 Essential (primary) hypertension: Secondary | ICD-10-CM | POA: Diagnosis not present

## 2017-10-07 DIAGNOSIS — Z466 Encounter for fitting and adjustment of urinary device: Secondary | ICD-10-CM | POA: Diagnosis not present

## 2017-10-07 DIAGNOSIS — Z8631 Personal history of diabetic foot ulcer: Secondary | ICD-10-CM | POA: Diagnosis not present

## 2017-10-07 DIAGNOSIS — E1142 Type 2 diabetes mellitus with diabetic polyneuropathy: Secondary | ICD-10-CM | POA: Diagnosis not present

## 2017-10-07 DIAGNOSIS — M129 Arthropathy, unspecified: Secondary | ICD-10-CM | POA: Diagnosis not present

## 2017-10-07 DIAGNOSIS — Z79891 Long term (current) use of opiate analgesic: Secondary | ICD-10-CM | POA: Diagnosis not present

## 2017-10-07 DIAGNOSIS — Z7982 Long term (current) use of aspirin: Secondary | ICD-10-CM | POA: Diagnosis not present

## 2017-10-07 DIAGNOSIS — Z4800 Encounter for change or removal of nonsurgical wound dressing: Secondary | ICD-10-CM | POA: Diagnosis not present

## 2017-10-07 DIAGNOSIS — R339 Retention of urine, unspecified: Secondary | ICD-10-CM | POA: Diagnosis not present

## 2017-10-07 DIAGNOSIS — Z794 Long term (current) use of insulin: Secondary | ICD-10-CM | POA: Diagnosis not present

## 2017-10-07 DIAGNOSIS — L89312 Pressure ulcer of right buttock, stage 2: Secondary | ICD-10-CM | POA: Diagnosis not present

## 2017-10-07 DIAGNOSIS — M5416 Radiculopathy, lumbar region: Secondary | ICD-10-CM | POA: Diagnosis not present

## 2017-10-09 DIAGNOSIS — Z87440 Personal history of urinary (tract) infections: Secondary | ICD-10-CM | POA: Diagnosis not present

## 2017-10-09 DIAGNOSIS — Z79899 Other long term (current) drug therapy: Secondary | ICD-10-CM | POA: Diagnosis not present

## 2017-10-09 DIAGNOSIS — Z7982 Long term (current) use of aspirin: Secondary | ICD-10-CM | POA: Diagnosis not present

## 2017-10-09 DIAGNOSIS — I10 Essential (primary) hypertension: Secondary | ICD-10-CM | POA: Diagnosis not present

## 2017-10-09 DIAGNOSIS — E119 Type 2 diabetes mellitus without complications: Secondary | ICD-10-CM | POA: Diagnosis not present

## 2017-10-09 DIAGNOSIS — T83098A Other mechanical complication of other indwelling urethral catheter, initial encounter: Secondary | ICD-10-CM | POA: Diagnosis not present

## 2017-10-09 DIAGNOSIS — Z87891 Personal history of nicotine dependence: Secondary | ICD-10-CM | POA: Diagnosis not present

## 2017-10-09 DIAGNOSIS — Z794 Long term (current) use of insulin: Secondary | ICD-10-CM | POA: Diagnosis not present

## 2017-10-09 DIAGNOSIS — R300 Dysuria: Secondary | ICD-10-CM | POA: Diagnosis not present

## 2017-10-10 DIAGNOSIS — Z8631 Personal history of diabetic foot ulcer: Secondary | ICD-10-CM | POA: Diagnosis not present

## 2017-10-10 DIAGNOSIS — Z466 Encounter for fitting and adjustment of urinary device: Secondary | ICD-10-CM | POA: Diagnosis not present

## 2017-10-10 DIAGNOSIS — Z794 Long term (current) use of insulin: Secondary | ICD-10-CM | POA: Diagnosis not present

## 2017-10-10 DIAGNOSIS — Z4800 Encounter for change or removal of nonsurgical wound dressing: Secondary | ICD-10-CM | POA: Diagnosis not present

## 2017-10-10 DIAGNOSIS — I10 Essential (primary) hypertension: Secondary | ICD-10-CM | POA: Diagnosis not present

## 2017-10-10 DIAGNOSIS — Z79891 Long term (current) use of opiate analgesic: Secondary | ICD-10-CM | POA: Diagnosis not present

## 2017-10-10 DIAGNOSIS — E1142 Type 2 diabetes mellitus with diabetic polyneuropathy: Secondary | ICD-10-CM | POA: Diagnosis not present

## 2017-10-10 DIAGNOSIS — L89312 Pressure ulcer of right buttock, stage 2: Secondary | ICD-10-CM | POA: Diagnosis not present

## 2017-10-10 DIAGNOSIS — R339 Retention of urine, unspecified: Secondary | ICD-10-CM | POA: Diagnosis not present

## 2017-10-10 DIAGNOSIS — Z7982 Long term (current) use of aspirin: Secondary | ICD-10-CM | POA: Diagnosis not present

## 2017-10-10 DIAGNOSIS — M129 Arthropathy, unspecified: Secondary | ICD-10-CM | POA: Diagnosis not present

## 2017-10-10 DIAGNOSIS — M5416 Radiculopathy, lumbar region: Secondary | ICD-10-CM | POA: Diagnosis not present

## 2017-10-11 DIAGNOSIS — R338 Other retention of urine: Secondary | ICD-10-CM | POA: Diagnosis not present

## 2017-10-11 DIAGNOSIS — N3020 Other chronic cystitis without hematuria: Secondary | ICD-10-CM | POA: Diagnosis not present

## 2017-10-12 DIAGNOSIS — E1142 Type 2 diabetes mellitus with diabetic polyneuropathy: Secondary | ICD-10-CM | POA: Diagnosis not present

## 2017-10-12 DIAGNOSIS — M129 Arthropathy, unspecified: Secondary | ICD-10-CM | POA: Diagnosis not present

## 2017-10-12 DIAGNOSIS — L89312 Pressure ulcer of right buttock, stage 2: Secondary | ICD-10-CM | POA: Diagnosis not present

## 2017-10-12 DIAGNOSIS — Z7982 Long term (current) use of aspirin: Secondary | ICD-10-CM | POA: Diagnosis not present

## 2017-10-12 DIAGNOSIS — Z79891 Long term (current) use of opiate analgesic: Secondary | ICD-10-CM | POA: Diagnosis not present

## 2017-10-12 DIAGNOSIS — Z4800 Encounter for change or removal of nonsurgical wound dressing: Secondary | ICD-10-CM | POA: Diagnosis not present

## 2017-10-12 DIAGNOSIS — Z8631 Personal history of diabetic foot ulcer: Secondary | ICD-10-CM | POA: Diagnosis not present

## 2017-10-12 DIAGNOSIS — Z794 Long term (current) use of insulin: Secondary | ICD-10-CM | POA: Diagnosis not present

## 2017-10-12 DIAGNOSIS — I10 Essential (primary) hypertension: Secondary | ICD-10-CM | POA: Diagnosis not present

## 2017-10-12 DIAGNOSIS — Z466 Encounter for fitting and adjustment of urinary device: Secondary | ICD-10-CM | POA: Diagnosis not present

## 2017-10-12 DIAGNOSIS — M5416 Radiculopathy, lumbar region: Secondary | ICD-10-CM | POA: Diagnosis not present

## 2017-10-12 DIAGNOSIS — R339 Retention of urine, unspecified: Secondary | ICD-10-CM | POA: Diagnosis not present

## 2017-10-14 DIAGNOSIS — Z466 Encounter for fitting and adjustment of urinary device: Secondary | ICD-10-CM | POA: Diagnosis not present

## 2017-10-14 DIAGNOSIS — E1142 Type 2 diabetes mellitus with diabetic polyneuropathy: Secondary | ICD-10-CM | POA: Diagnosis not present

## 2017-10-14 DIAGNOSIS — R339 Retention of urine, unspecified: Secondary | ICD-10-CM | POA: Diagnosis not present

## 2017-10-14 DIAGNOSIS — L89312 Pressure ulcer of right buttock, stage 2: Secondary | ICD-10-CM | POA: Diagnosis not present

## 2017-10-14 DIAGNOSIS — M5416 Radiculopathy, lumbar region: Secondary | ICD-10-CM | POA: Diagnosis not present

## 2017-10-14 DIAGNOSIS — Z794 Long term (current) use of insulin: Secondary | ICD-10-CM | POA: Diagnosis not present

## 2017-10-14 DIAGNOSIS — Z79891 Long term (current) use of opiate analgesic: Secondary | ICD-10-CM | POA: Diagnosis not present

## 2017-10-14 DIAGNOSIS — M129 Arthropathy, unspecified: Secondary | ICD-10-CM | POA: Diagnosis not present

## 2017-10-14 DIAGNOSIS — I10 Essential (primary) hypertension: Secondary | ICD-10-CM | POA: Diagnosis not present

## 2017-10-14 DIAGNOSIS — Z8631 Personal history of diabetic foot ulcer: Secondary | ICD-10-CM | POA: Diagnosis not present

## 2017-10-14 DIAGNOSIS — Z4800 Encounter for change or removal of nonsurgical wound dressing: Secondary | ICD-10-CM | POA: Diagnosis not present

## 2017-10-14 DIAGNOSIS — Z7982 Long term (current) use of aspirin: Secondary | ICD-10-CM | POA: Diagnosis not present

## 2017-10-17 DIAGNOSIS — Z79891 Long term (current) use of opiate analgesic: Secondary | ICD-10-CM | POA: Diagnosis not present

## 2017-10-17 DIAGNOSIS — I10 Essential (primary) hypertension: Secondary | ICD-10-CM | POA: Diagnosis not present

## 2017-10-17 DIAGNOSIS — Z794 Long term (current) use of insulin: Secondary | ICD-10-CM | POA: Diagnosis not present

## 2017-10-17 DIAGNOSIS — E1142 Type 2 diabetes mellitus with diabetic polyneuropathy: Secondary | ICD-10-CM | POA: Diagnosis not present

## 2017-10-17 DIAGNOSIS — Z466 Encounter for fitting and adjustment of urinary device: Secondary | ICD-10-CM | POA: Diagnosis not present

## 2017-10-17 DIAGNOSIS — Z4800 Encounter for change or removal of nonsurgical wound dressing: Secondary | ICD-10-CM | POA: Diagnosis not present

## 2017-10-17 DIAGNOSIS — Z7982 Long term (current) use of aspirin: Secondary | ICD-10-CM | POA: Diagnosis not present

## 2017-10-17 DIAGNOSIS — R339 Retention of urine, unspecified: Secondary | ICD-10-CM | POA: Diagnosis not present

## 2017-10-17 DIAGNOSIS — Z8631 Personal history of diabetic foot ulcer: Secondary | ICD-10-CM | POA: Diagnosis not present

## 2017-10-17 DIAGNOSIS — M5416 Radiculopathy, lumbar region: Secondary | ICD-10-CM | POA: Diagnosis not present

## 2017-10-17 DIAGNOSIS — L89312 Pressure ulcer of right buttock, stage 2: Secondary | ICD-10-CM | POA: Diagnosis not present

## 2017-10-17 DIAGNOSIS — M129 Arthropathy, unspecified: Secondary | ICD-10-CM | POA: Diagnosis not present

## 2017-10-18 DIAGNOSIS — Z466 Encounter for fitting and adjustment of urinary device: Secondary | ICD-10-CM | POA: Diagnosis not present

## 2017-10-18 DIAGNOSIS — R339 Retention of urine, unspecified: Secondary | ICD-10-CM | POA: Diagnosis not present

## 2017-10-19 DIAGNOSIS — Z7982 Long term (current) use of aspirin: Secondary | ICD-10-CM | POA: Diagnosis not present

## 2017-10-19 DIAGNOSIS — Z794 Long term (current) use of insulin: Secondary | ICD-10-CM | POA: Diagnosis not present

## 2017-10-19 DIAGNOSIS — M5416 Radiculopathy, lumbar region: Secondary | ICD-10-CM | POA: Diagnosis not present

## 2017-10-19 DIAGNOSIS — Z79891 Long term (current) use of opiate analgesic: Secondary | ICD-10-CM | POA: Diagnosis not present

## 2017-10-19 DIAGNOSIS — R339 Retention of urine, unspecified: Secondary | ICD-10-CM | POA: Diagnosis not present

## 2017-10-19 DIAGNOSIS — Z4800 Encounter for change or removal of nonsurgical wound dressing: Secondary | ICD-10-CM | POA: Diagnosis not present

## 2017-10-19 DIAGNOSIS — Z8631 Personal history of diabetic foot ulcer: Secondary | ICD-10-CM | POA: Diagnosis not present

## 2017-10-19 DIAGNOSIS — M129 Arthropathy, unspecified: Secondary | ICD-10-CM | POA: Diagnosis not present

## 2017-10-19 DIAGNOSIS — I10 Essential (primary) hypertension: Secondary | ICD-10-CM | POA: Diagnosis not present

## 2017-10-19 DIAGNOSIS — L89312 Pressure ulcer of right buttock, stage 2: Secondary | ICD-10-CM | POA: Diagnosis not present

## 2017-10-19 DIAGNOSIS — E1142 Type 2 diabetes mellitus with diabetic polyneuropathy: Secondary | ICD-10-CM | POA: Diagnosis not present

## 2017-10-19 DIAGNOSIS — Z466 Encounter for fitting and adjustment of urinary device: Secondary | ICD-10-CM | POA: Diagnosis not present

## 2017-10-21 DIAGNOSIS — Z1231 Encounter for screening mammogram for malignant neoplasm of breast: Secondary | ICD-10-CM | POA: Diagnosis not present

## 2017-10-21 DIAGNOSIS — L89313 Pressure ulcer of right buttock, stage 3: Secondary | ICD-10-CM | POA: Diagnosis not present

## 2017-10-21 DIAGNOSIS — L89319 Pressure ulcer of right buttock, unspecified stage: Secondary | ICD-10-CM | POA: Diagnosis not present

## 2017-10-24 DIAGNOSIS — Z8631 Personal history of diabetic foot ulcer: Secondary | ICD-10-CM | POA: Diagnosis not present

## 2017-10-24 DIAGNOSIS — M5416 Radiculopathy, lumbar region: Secondary | ICD-10-CM | POA: Diagnosis not present

## 2017-10-24 DIAGNOSIS — R339 Retention of urine, unspecified: Secondary | ICD-10-CM | POA: Diagnosis not present

## 2017-10-24 DIAGNOSIS — M129 Arthropathy, unspecified: Secondary | ICD-10-CM | POA: Diagnosis not present

## 2017-10-24 DIAGNOSIS — Z466 Encounter for fitting and adjustment of urinary device: Secondary | ICD-10-CM | POA: Diagnosis not present

## 2017-10-24 DIAGNOSIS — L89312 Pressure ulcer of right buttock, stage 2: Secondary | ICD-10-CM | POA: Diagnosis not present

## 2017-10-24 DIAGNOSIS — Z7982 Long term (current) use of aspirin: Secondary | ICD-10-CM | POA: Diagnosis not present

## 2017-10-24 DIAGNOSIS — Z79891 Long term (current) use of opiate analgesic: Secondary | ICD-10-CM | POA: Diagnosis not present

## 2017-10-24 DIAGNOSIS — E1142 Type 2 diabetes mellitus with diabetic polyneuropathy: Secondary | ICD-10-CM | POA: Diagnosis not present

## 2017-10-24 DIAGNOSIS — I10 Essential (primary) hypertension: Secondary | ICD-10-CM | POA: Diagnosis not present

## 2017-10-24 DIAGNOSIS — Z794 Long term (current) use of insulin: Secondary | ICD-10-CM | POA: Diagnosis not present

## 2017-10-24 DIAGNOSIS — Z4800 Encounter for change or removal of nonsurgical wound dressing: Secondary | ICD-10-CM | POA: Diagnosis not present

## 2017-10-26 DIAGNOSIS — I10 Essential (primary) hypertension: Secondary | ICD-10-CM | POA: Diagnosis not present

## 2017-10-26 DIAGNOSIS — M129 Arthropathy, unspecified: Secondary | ICD-10-CM | POA: Diagnosis not present

## 2017-10-26 DIAGNOSIS — M5416 Radiculopathy, lumbar region: Secondary | ICD-10-CM | POA: Diagnosis not present

## 2017-10-26 DIAGNOSIS — Z466 Encounter for fitting and adjustment of urinary device: Secondary | ICD-10-CM | POA: Diagnosis not present

## 2017-10-26 DIAGNOSIS — R339 Retention of urine, unspecified: Secondary | ICD-10-CM | POA: Diagnosis not present

## 2017-10-26 DIAGNOSIS — Z8631 Personal history of diabetic foot ulcer: Secondary | ICD-10-CM | POA: Diagnosis not present

## 2017-10-26 DIAGNOSIS — Z79891 Long term (current) use of opiate analgesic: Secondary | ICD-10-CM | POA: Diagnosis not present

## 2017-10-26 DIAGNOSIS — Z7982 Long term (current) use of aspirin: Secondary | ICD-10-CM | POA: Diagnosis not present

## 2017-10-26 DIAGNOSIS — E1142 Type 2 diabetes mellitus with diabetic polyneuropathy: Secondary | ICD-10-CM | POA: Diagnosis not present

## 2017-10-26 DIAGNOSIS — Z794 Long term (current) use of insulin: Secondary | ICD-10-CM | POA: Diagnosis not present

## 2017-10-26 DIAGNOSIS — Z4800 Encounter for change or removal of nonsurgical wound dressing: Secondary | ICD-10-CM | POA: Diagnosis not present

## 2017-10-26 DIAGNOSIS — L89312 Pressure ulcer of right buttock, stage 2: Secondary | ICD-10-CM | POA: Diagnosis not present

## 2017-10-27 DIAGNOSIS — L89312 Pressure ulcer of right buttock, stage 2: Secondary | ICD-10-CM | POA: Diagnosis not present

## 2017-10-27 DIAGNOSIS — Z794 Long term (current) use of insulin: Secondary | ICD-10-CM | POA: Diagnosis not present

## 2017-10-27 DIAGNOSIS — Z8631 Personal history of diabetic foot ulcer: Secondary | ICD-10-CM | POA: Diagnosis not present

## 2017-10-27 DIAGNOSIS — E1142 Type 2 diabetes mellitus with diabetic polyneuropathy: Secondary | ICD-10-CM | POA: Diagnosis not present

## 2017-10-27 DIAGNOSIS — M5416 Radiculopathy, lumbar region: Secondary | ICD-10-CM | POA: Diagnosis not present

## 2017-10-27 DIAGNOSIS — Z4800 Encounter for change or removal of nonsurgical wound dressing: Secondary | ICD-10-CM | POA: Diagnosis not present

## 2017-10-27 DIAGNOSIS — Z79891 Long term (current) use of opiate analgesic: Secondary | ICD-10-CM | POA: Diagnosis not present

## 2017-10-27 DIAGNOSIS — R339 Retention of urine, unspecified: Secondary | ICD-10-CM | POA: Diagnosis not present

## 2017-10-27 DIAGNOSIS — Z466 Encounter for fitting and adjustment of urinary device: Secondary | ICD-10-CM | POA: Diagnosis not present

## 2017-10-27 DIAGNOSIS — M129 Arthropathy, unspecified: Secondary | ICD-10-CM | POA: Diagnosis not present

## 2017-10-27 DIAGNOSIS — Z7982 Long term (current) use of aspirin: Secondary | ICD-10-CM | POA: Diagnosis not present

## 2017-10-27 DIAGNOSIS — I10 Essential (primary) hypertension: Secondary | ICD-10-CM | POA: Diagnosis not present

## 2017-10-28 DIAGNOSIS — Z79899 Other long term (current) drug therapy: Secondary | ICD-10-CM | POA: Diagnosis not present

## 2017-10-28 DIAGNOSIS — G894 Chronic pain syndrome: Secondary | ICD-10-CM | POA: Diagnosis not present

## 2017-10-28 DIAGNOSIS — M25559 Pain in unspecified hip: Secondary | ICD-10-CM | POA: Diagnosis not present

## 2017-10-28 DIAGNOSIS — M47816 Spondylosis without myelopathy or radiculopathy, lumbar region: Secondary | ICD-10-CM | POA: Diagnosis not present

## 2017-10-28 DIAGNOSIS — M169 Osteoarthritis of hip, unspecified: Secondary | ICD-10-CM | POA: Diagnosis not present

## 2017-10-28 DIAGNOSIS — Z79891 Long term (current) use of opiate analgesic: Secondary | ICD-10-CM | POA: Diagnosis not present

## 2017-10-31 DIAGNOSIS — Z794 Long term (current) use of insulin: Secondary | ICD-10-CM | POA: Diagnosis not present

## 2017-10-31 DIAGNOSIS — Z8631 Personal history of diabetic foot ulcer: Secondary | ICD-10-CM | POA: Diagnosis not present

## 2017-10-31 DIAGNOSIS — L89312 Pressure ulcer of right buttock, stage 2: Secondary | ICD-10-CM | POA: Diagnosis not present

## 2017-10-31 DIAGNOSIS — R339 Retention of urine, unspecified: Secondary | ICD-10-CM | POA: Diagnosis not present

## 2017-10-31 DIAGNOSIS — Z7982 Long term (current) use of aspirin: Secondary | ICD-10-CM | POA: Diagnosis not present

## 2017-10-31 DIAGNOSIS — M129 Arthropathy, unspecified: Secondary | ICD-10-CM | POA: Diagnosis not present

## 2017-10-31 DIAGNOSIS — Z4800 Encounter for change or removal of nonsurgical wound dressing: Secondary | ICD-10-CM | POA: Diagnosis not present

## 2017-10-31 DIAGNOSIS — E1142 Type 2 diabetes mellitus with diabetic polyneuropathy: Secondary | ICD-10-CM | POA: Diagnosis not present

## 2017-10-31 DIAGNOSIS — I10 Essential (primary) hypertension: Secondary | ICD-10-CM | POA: Diagnosis not present

## 2017-10-31 DIAGNOSIS — Z466 Encounter for fitting and adjustment of urinary device: Secondary | ICD-10-CM | POA: Diagnosis not present

## 2017-10-31 DIAGNOSIS — Z79891 Long term (current) use of opiate analgesic: Secondary | ICD-10-CM | POA: Diagnosis not present

## 2017-10-31 DIAGNOSIS — M5416 Radiculopathy, lumbar region: Secondary | ICD-10-CM | POA: Diagnosis not present

## 2017-11-02 DIAGNOSIS — L89312 Pressure ulcer of right buttock, stage 2: Secondary | ICD-10-CM | POA: Diagnosis not present

## 2017-11-02 DIAGNOSIS — Z794 Long term (current) use of insulin: Secondary | ICD-10-CM | POA: Diagnosis not present

## 2017-11-02 DIAGNOSIS — Z8631 Personal history of diabetic foot ulcer: Secondary | ICD-10-CM | POA: Diagnosis not present

## 2017-11-02 DIAGNOSIS — M129 Arthropathy, unspecified: Secondary | ICD-10-CM | POA: Diagnosis not present

## 2017-11-02 DIAGNOSIS — Z466 Encounter for fitting and adjustment of urinary device: Secondary | ICD-10-CM | POA: Diagnosis not present

## 2017-11-02 DIAGNOSIS — M5416 Radiculopathy, lumbar region: Secondary | ICD-10-CM | POA: Diagnosis not present

## 2017-11-02 DIAGNOSIS — I10 Essential (primary) hypertension: Secondary | ICD-10-CM | POA: Diagnosis not present

## 2017-11-02 DIAGNOSIS — Z79891 Long term (current) use of opiate analgesic: Secondary | ICD-10-CM | POA: Diagnosis not present

## 2017-11-02 DIAGNOSIS — E1142 Type 2 diabetes mellitus with diabetic polyneuropathy: Secondary | ICD-10-CM | POA: Diagnosis not present

## 2017-11-02 DIAGNOSIS — Z4800 Encounter for change or removal of nonsurgical wound dressing: Secondary | ICD-10-CM | POA: Diagnosis not present

## 2017-11-02 DIAGNOSIS — Z7982 Long term (current) use of aspirin: Secondary | ICD-10-CM | POA: Diagnosis not present

## 2017-11-02 DIAGNOSIS — R339 Retention of urine, unspecified: Secondary | ICD-10-CM | POA: Diagnosis not present

## 2017-11-04 DIAGNOSIS — M5416 Radiculopathy, lumbar region: Secondary | ICD-10-CM | POA: Diagnosis not present

## 2017-11-04 DIAGNOSIS — Z4800 Encounter for change or removal of nonsurgical wound dressing: Secondary | ICD-10-CM | POA: Diagnosis not present

## 2017-11-04 DIAGNOSIS — Z466 Encounter for fitting and adjustment of urinary device: Secondary | ICD-10-CM | POA: Diagnosis not present

## 2017-11-04 DIAGNOSIS — Z794 Long term (current) use of insulin: Secondary | ICD-10-CM | POA: Diagnosis not present

## 2017-11-04 DIAGNOSIS — Z7982 Long term (current) use of aspirin: Secondary | ICD-10-CM | POA: Diagnosis not present

## 2017-11-04 DIAGNOSIS — Z8631 Personal history of diabetic foot ulcer: Secondary | ICD-10-CM | POA: Diagnosis not present

## 2017-11-04 DIAGNOSIS — R339 Retention of urine, unspecified: Secondary | ICD-10-CM | POA: Diagnosis not present

## 2017-11-04 DIAGNOSIS — Z79891 Long term (current) use of opiate analgesic: Secondary | ICD-10-CM | POA: Diagnosis not present

## 2017-11-04 DIAGNOSIS — I10 Essential (primary) hypertension: Secondary | ICD-10-CM | POA: Diagnosis not present

## 2017-11-04 DIAGNOSIS — L89312 Pressure ulcer of right buttock, stage 2: Secondary | ICD-10-CM | POA: Diagnosis not present

## 2017-11-04 DIAGNOSIS — E1142 Type 2 diabetes mellitus with diabetic polyneuropathy: Secondary | ICD-10-CM | POA: Diagnosis not present

## 2017-11-04 DIAGNOSIS — M129 Arthropathy, unspecified: Secondary | ICD-10-CM | POA: Diagnosis not present

## 2017-11-07 ENCOUNTER — Other Ambulatory Visit: Payer: Self-pay | Admitting: Cardiology

## 2017-11-07 DIAGNOSIS — Z794 Long term (current) use of insulin: Secondary | ICD-10-CM | POA: Diagnosis not present

## 2017-11-07 DIAGNOSIS — Z466 Encounter for fitting and adjustment of urinary device: Secondary | ICD-10-CM | POA: Diagnosis not present

## 2017-11-07 DIAGNOSIS — Z4800 Encounter for change or removal of nonsurgical wound dressing: Secondary | ICD-10-CM | POA: Diagnosis not present

## 2017-11-07 DIAGNOSIS — Z79891 Long term (current) use of opiate analgesic: Secondary | ICD-10-CM | POA: Diagnosis not present

## 2017-11-07 DIAGNOSIS — Z8631 Personal history of diabetic foot ulcer: Secondary | ICD-10-CM | POA: Diagnosis not present

## 2017-11-07 DIAGNOSIS — Z7982 Long term (current) use of aspirin: Secondary | ICD-10-CM | POA: Diagnosis not present

## 2017-11-07 DIAGNOSIS — M5416 Radiculopathy, lumbar region: Secondary | ICD-10-CM | POA: Diagnosis not present

## 2017-11-07 DIAGNOSIS — R339 Retention of urine, unspecified: Secondary | ICD-10-CM | POA: Diagnosis not present

## 2017-11-07 DIAGNOSIS — E1142 Type 2 diabetes mellitus with diabetic polyneuropathy: Secondary | ICD-10-CM | POA: Diagnosis not present

## 2017-11-07 DIAGNOSIS — L89312 Pressure ulcer of right buttock, stage 2: Secondary | ICD-10-CM | POA: Diagnosis not present

## 2017-11-07 DIAGNOSIS — I10 Essential (primary) hypertension: Secondary | ICD-10-CM | POA: Diagnosis not present

## 2017-11-07 DIAGNOSIS — M129 Arthropathy, unspecified: Secondary | ICD-10-CM | POA: Diagnosis not present

## 2017-11-09 DIAGNOSIS — Z794 Long term (current) use of insulin: Secondary | ICD-10-CM | POA: Diagnosis not present

## 2017-11-09 DIAGNOSIS — I10 Essential (primary) hypertension: Secondary | ICD-10-CM | POA: Diagnosis not present

## 2017-11-09 DIAGNOSIS — Z7982 Long term (current) use of aspirin: Secondary | ICD-10-CM | POA: Diagnosis not present

## 2017-11-09 DIAGNOSIS — Z4800 Encounter for change or removal of nonsurgical wound dressing: Secondary | ICD-10-CM | POA: Diagnosis not present

## 2017-11-09 DIAGNOSIS — Z8631 Personal history of diabetic foot ulcer: Secondary | ICD-10-CM | POA: Diagnosis not present

## 2017-11-09 DIAGNOSIS — Z79891 Long term (current) use of opiate analgesic: Secondary | ICD-10-CM | POA: Diagnosis not present

## 2017-11-09 DIAGNOSIS — L89312 Pressure ulcer of right buttock, stage 2: Secondary | ICD-10-CM | POA: Diagnosis not present

## 2017-11-09 DIAGNOSIS — M5416 Radiculopathy, lumbar region: Secondary | ICD-10-CM | POA: Diagnosis not present

## 2017-11-09 DIAGNOSIS — M129 Arthropathy, unspecified: Secondary | ICD-10-CM | POA: Diagnosis not present

## 2017-11-09 DIAGNOSIS — Z466 Encounter for fitting and adjustment of urinary device: Secondary | ICD-10-CM | POA: Diagnosis not present

## 2017-11-09 DIAGNOSIS — E1142 Type 2 diabetes mellitus with diabetic polyneuropathy: Secondary | ICD-10-CM | POA: Diagnosis not present

## 2017-11-09 DIAGNOSIS — R339 Retention of urine, unspecified: Secondary | ICD-10-CM | POA: Diagnosis not present

## 2017-11-11 DIAGNOSIS — M129 Arthropathy, unspecified: Secondary | ICD-10-CM | POA: Diagnosis not present

## 2017-11-11 DIAGNOSIS — Z466 Encounter for fitting and adjustment of urinary device: Secondary | ICD-10-CM | POA: Diagnosis not present

## 2017-11-11 DIAGNOSIS — Z79891 Long term (current) use of opiate analgesic: Secondary | ICD-10-CM | POA: Diagnosis not present

## 2017-11-11 DIAGNOSIS — Z8631 Personal history of diabetic foot ulcer: Secondary | ICD-10-CM | POA: Diagnosis not present

## 2017-11-11 DIAGNOSIS — Z7982 Long term (current) use of aspirin: Secondary | ICD-10-CM | POA: Diagnosis not present

## 2017-11-11 DIAGNOSIS — E1142 Type 2 diabetes mellitus with diabetic polyneuropathy: Secondary | ICD-10-CM | POA: Diagnosis not present

## 2017-11-11 DIAGNOSIS — I10 Essential (primary) hypertension: Secondary | ICD-10-CM | POA: Diagnosis not present

## 2017-11-11 DIAGNOSIS — Z794 Long term (current) use of insulin: Secondary | ICD-10-CM | POA: Diagnosis not present

## 2017-11-11 DIAGNOSIS — R339 Retention of urine, unspecified: Secondary | ICD-10-CM | POA: Diagnosis not present

## 2017-11-11 DIAGNOSIS — Z4800 Encounter for change or removal of nonsurgical wound dressing: Secondary | ICD-10-CM | POA: Diagnosis not present

## 2017-11-11 DIAGNOSIS — L89312 Pressure ulcer of right buttock, stage 2: Secondary | ICD-10-CM | POA: Diagnosis not present

## 2017-11-11 DIAGNOSIS — M5416 Radiculopathy, lumbar region: Secondary | ICD-10-CM | POA: Diagnosis not present

## 2017-11-14 ENCOUNTER — Encounter: Payer: Self-pay | Admitting: Cardiology

## 2017-11-14 ENCOUNTER — Ambulatory Visit: Payer: Medicare Other | Admitting: Cardiology

## 2017-11-14 VITALS — BP 101/46 | HR 69 | Ht 70.0 in | Wt 302.0 lb

## 2017-11-14 DIAGNOSIS — I10 Essential (primary) hypertension: Secondary | ICD-10-CM

## 2017-11-14 DIAGNOSIS — I5033 Acute on chronic diastolic (congestive) heart failure: Secondary | ICD-10-CM | POA: Diagnosis not present

## 2017-11-14 MED ORDER — TORSEMIDE 20 MG PO TABS: 40.0000 mg | ORAL_TABLET | Freq: Two times a day (BID) | ORAL | 1 refills | Status: DC

## 2017-11-14 NOTE — Patient Instructions (Signed)
Medication Instructions:  Your physician has recommended you make the following change in your medication:    STOP Lasix   START Torsemide 40 mg twice daily   Please continue all other medications as prescribed   Labwork: NONE  Testing/Procedures: NONE  Follow-Up: Your physician recommends that you schedule a follow-up appointment in: [place] DR. BRANCH   Any Other Special Instructions Will Be Listed Below (If Applicable).  If you need a refill on your cardiac medications before your next appointment, please call your pharmacy.

## 2017-11-14 NOTE — Progress Notes (Signed)
Clinical Summary Brianna Ray is a 72 y.o.female seen today for a focused visit on her history of diastolic HF and leg edema.    1. LE edema/Chronic diaastolic HF - 16/1096 echo LVEF LVEF 55-60%, grade II diastoilc dysfunction - weight had decreased from 296 to 283 lbs in 05/2017. Today trended back up to 291 lbs.  - some increase in edema. No recent SOB/DOE - some increased sodium intake - compliant with lasix, taking 60mg  daily daily.    - last visit we increased lasix to 80mg  daily due to weight gain and edema. Uptrend in Cr (1.09 from 12/2016  to 1.39 08/2017, we then chagned to 80mg  alternating days with 60mg .  - weight gain, some increased LE edema.  - empties 2L bag about twice a day.       Past Medical History:  Diagnosis Date  . Anxiety   . Cancer ([place])   . Depression   . Diabetes mellitus   . Dysrhythmia   . Fibromyalgia   . Guaiac positive stools   . Hypertension   . Neuromuscular disorder ([place])   . Reflux   . Ruptured lumbar disc    x2  . Shortness of breath   . Spinal stenosis      Allergies  Allergen Reactions  . Macrodantin [Nitrofurantoin]      Current Outpatient Medications  Medication Sig Dispense Refill  . aspirin EC 81 MG tablet Take 81 mg by mouth daily.    . cholecalciferol (VITAMIN D) 400 units TABS tablet Take 400 Units by mouth daily.    Marland Kitchen diltiazem (CARDIZEM) 60 MG tablet TAKE 2 TABLETS BY MOUTH TWICE DAILY 180 tablet 3  . enalapril (VASOTEC) 20 MG tablet Take 20 mg by mouth daily.    . furosemide (LASIX) 20 MG tablet TAKE 3 TABLETS BY MOUTH EVERY OTHER DAY 45 tablet 1  . furosemide (LASIX) 80 MG tablet TAKE 1 TABLET EVERY OTHER DAY ALTERNATING WITH 60 MG 45 tablet 1  . gabapentin (NEURONTIN) 600 MG tablet Take 600 mg by mouth 3 (three) times daily.    Marland Kitchen glyBURIDE (DIABETA) 5 MG tablet Take 10 mg by mouth daily with breakfast.     . insulin glargine (LANTUS) 100 UNIT/ML injection Inject 32 Units into the skin 2 (two) times daily.      . magnesium oxide (MAG-OX) 400 MG tablet TAKE 1 TABLET 2 TIMES DAILY FOR 4 DAYS THEN TAKE 1 TABLET DAILY 90 tablet 1  . metFORMIN (GLUCOPHAGE) 1000 MG tablet Take 1,000 mg by mouth 2 (two) times daily.     . metoprolol succinate (TOPROL-XL) 50 MG 24 hr tablet TAKE 1 TABLET BY MOUTH ONCE DAILY TAKE  WITH  OR  IMMEDIATELY  FOLLOWING  A  MEAL 90 tablet 1  . ondansetron (ZOFRAN) 8 MG tablet Take 8 mg by mouth every 8 (eight) hours as needed for nausea or vomiting.    Marland Kitchen oxyCODONE-acetaminophen (PERCOCET) 10-325 MG tablet Take 1 tablet by mouth every 6 (six) hours as needed for pain.     . protein supplement (PROMOD) POWD Take 1 scoop by mouth 2 (two) times daily.    . vitamin B-12 (CYANOCOBALAMIN) 1000 MCG tablet Take 1,000 mcg by mouth daily.    . vitamin C (ASCORBIC ACID) 500 MG tablet Take 500 mg by mouth daily.     No current facility-administered medications for this visit.      Past Surgical History:  Procedure Laterality Date  . ABDOMINAL HYSTERECTOMY    .  APPENDECTOMY    . CARPAL TUNNEL RELEASE Bilateral   . CHOLECYSTECTOMY    . COLONOSCOPY  10/21/2010   Procedure: COLONOSCOPY;  Surgeon: Rogene Houston, MD;  [facility];  Service: Endoscopy;  Laterality: N/A;  . COLONOSCOPY  10/21/2010  . ESOPHAGOGASTRODUODENOSCOPY  10/21/2010   Procedure: ESOPHAGOGASTRODUODENOSCOPY (EGD);  Surgeon: Rogene Houston, MD;  [facility];  Service: Endoscopy;  Laterality: N/A;  . EYE SURGERY    . TONSILLECTOMY  AGE 72     Allergies  Allergen Reactions  . Macrodantin [Nitrofurantoin]       No family history on file.   Social History Brianna Ray reports that she has quit smoking. She has never used smokeless tobacco. Brianna Ray reports that she does not drink alcohol.   Review of Systems CONSTITUTIONAL: No weight loss, fever, chills, weakness or fatigue.  HEENT: Eyes: No visual loss, blurred vision, double vision or yellow sclerae.No hearing loss, sneezing,  congestion, runny nose or sore throat.  SKIN: No rash or itching.  CARDIOVASCULAR: no chest pain, no palpitations.  RESPIRATORY: occasional SOB GASTROINTESTINAL: No anorexia, nausea, vomiting or diarrhea. No abdominal pain or blood.  GENITOURINARY: No burning on urination, no polyuria NEUROLOGICAL: No headache, dizziness, syncope, paralysis, ataxia, numbness or tingling in the extremities. No change in bowel or bladder control.  MUSCULOSKELETAL: No muscle, back pain, joint pain or stiffness.  LYMPHATICS: No enlarged nodes. No history of splenectomy.  PSYCHIATRIC: No history of depression or anxiety.  ENDOCRINOLOGIC: No reports of sweating, cold or heat intolerance. No polyuria or polydipsia.  Marland Kitchen   Physical Examination Vitals:   11/14/17 1500  BP: (!) 101/46  Pulse: 69  SpO2: 97%   Vitals:   11/14/17 1500  Weight: (!) 302 lb (137 kg)  Height: 5\' 10"  (1.778 m)    Gen: resting comfortably, no acute distress HEENT: no scleral icterus, pupils equal round and reactive, no palptable cervical adenopathy,  CV: RRR, no m/r/g, no jvd Resp: Clear to auscultation bilaterally GI: abdomen is soft, non-tender, non-distended, normal bowel sounds, no hepatosplenomegaly MSK: extremities are warm, no edema.  Skin: warm, no rash Neuro:  no focal deficits Psych: appropriate affect   Diagnostic Studies  01/2017 echo LVEF LVEF 55-60%, grade II diastoilc dysfunction   Assessment and Plan  1. LE edema/Acute on chornic diastolic HF - ongoing edema, weight up additional 11 lbs since last visit - we had increased her lasix to 80mg  bid last visit, then lowered down to 80mg  alternating with 60mg  based on her labs. Her Cr elevated was compared to a lab about a year prior, this may be her new baseline or we may just have to live with a higher Cr for her to get her diuresed. She certaintly has gained more fluid since last visit - d/c lasix, change to torsemide 40mg  bid. SHe reports labs comning up with  pcp next week  2. HTN - repeat manuial bp 120/55, acceptable range for her.    F/u 6 weeks   Arnoldo Lenis, M.D

## 2017-11-15 DIAGNOSIS — Z4800 Encounter for change or removal of nonsurgical wound dressing: Secondary | ICD-10-CM | POA: Diagnosis not present

## 2017-11-15 DIAGNOSIS — L89312 Pressure ulcer of right buttock, stage 2: Secondary | ICD-10-CM | POA: Diagnosis not present

## 2017-11-15 DIAGNOSIS — Z7982 Long term (current) use of aspirin: Secondary | ICD-10-CM | POA: Diagnosis not present

## 2017-11-15 DIAGNOSIS — I10 Essential (primary) hypertension: Secondary | ICD-10-CM | POA: Diagnosis not present

## 2017-11-15 DIAGNOSIS — R339 Retention of urine, unspecified: Secondary | ICD-10-CM | POA: Diagnosis not present

## 2017-11-15 DIAGNOSIS — Z79891 Long term (current) use of opiate analgesic: Secondary | ICD-10-CM | POA: Diagnosis not present

## 2017-11-15 DIAGNOSIS — E1142 Type 2 diabetes mellitus with diabetic polyneuropathy: Secondary | ICD-10-CM | POA: Diagnosis not present

## 2017-11-15 DIAGNOSIS — Z466 Encounter for fitting and adjustment of urinary device: Secondary | ICD-10-CM | POA: Diagnosis not present

## 2017-11-15 DIAGNOSIS — Z794 Long term (current) use of insulin: Secondary | ICD-10-CM | POA: Diagnosis not present

## 2017-11-15 DIAGNOSIS — M5416 Radiculopathy, lumbar region: Secondary | ICD-10-CM | POA: Diagnosis not present

## 2017-11-15 DIAGNOSIS — M129 Arthropathy, unspecified: Secondary | ICD-10-CM | POA: Diagnosis not present

## 2017-11-15 DIAGNOSIS — Z8631 Personal history of diabetic foot ulcer: Secondary | ICD-10-CM | POA: Diagnosis not present

## 2017-11-18 DIAGNOSIS — L89312 Pressure ulcer of right buttock, stage 2: Secondary | ICD-10-CM | POA: Diagnosis not present

## 2017-11-18 DIAGNOSIS — Z79891 Long term (current) use of opiate analgesic: Secondary | ICD-10-CM | POA: Diagnosis not present

## 2017-11-18 DIAGNOSIS — Z794 Long term (current) use of insulin: Secondary | ICD-10-CM | POA: Diagnosis not present

## 2017-11-18 DIAGNOSIS — E1142 Type 2 diabetes mellitus with diabetic polyneuropathy: Secondary | ICD-10-CM | POA: Diagnosis not present

## 2017-11-18 DIAGNOSIS — M5416 Radiculopathy, lumbar region: Secondary | ICD-10-CM | POA: Diagnosis not present

## 2017-11-18 DIAGNOSIS — R339 Retention of urine, unspecified: Secondary | ICD-10-CM | POA: Diagnosis not present

## 2017-11-18 DIAGNOSIS — Z466 Encounter for fitting and adjustment of urinary device: Secondary | ICD-10-CM | POA: Diagnosis not present

## 2017-11-18 DIAGNOSIS — I10 Essential (primary) hypertension: Secondary | ICD-10-CM | POA: Diagnosis not present

## 2017-11-18 DIAGNOSIS — Z8631 Personal history of diabetic foot ulcer: Secondary | ICD-10-CM | POA: Diagnosis not present

## 2017-11-18 DIAGNOSIS — Z4800 Encounter for change or removal of nonsurgical wound dressing: Secondary | ICD-10-CM | POA: Diagnosis not present

## 2017-11-18 DIAGNOSIS — Z7982 Long term (current) use of aspirin: Secondary | ICD-10-CM | POA: Diagnosis not present

## 2017-11-18 DIAGNOSIS — M129 Arthropathy, unspecified: Secondary | ICD-10-CM | POA: Diagnosis not present

## 2017-11-21 ENCOUNTER — Telehealth: Payer: Self-pay | Admitting: Cardiology

## 2017-11-21 DIAGNOSIS — I10 Essential (primary) hypertension: Secondary | ICD-10-CM | POA: Diagnosis not present

## 2017-11-21 DIAGNOSIS — Z8631 Personal history of diabetic foot ulcer: Secondary | ICD-10-CM | POA: Diagnosis not present

## 2017-11-21 DIAGNOSIS — Z794 Long term (current) use of insulin: Secondary | ICD-10-CM | POA: Diagnosis not present

## 2017-11-21 DIAGNOSIS — Z7982 Long term (current) use of aspirin: Secondary | ICD-10-CM | POA: Diagnosis not present

## 2017-11-21 DIAGNOSIS — L89312 Pressure ulcer of right buttock, stage 2: Secondary | ICD-10-CM | POA: Diagnosis not present

## 2017-11-21 DIAGNOSIS — M129 Arthropathy, unspecified: Secondary | ICD-10-CM | POA: Diagnosis not present

## 2017-11-21 DIAGNOSIS — R339 Retention of urine, unspecified: Secondary | ICD-10-CM | POA: Diagnosis not present

## 2017-11-21 DIAGNOSIS — Z79891 Long term (current) use of opiate analgesic: Secondary | ICD-10-CM | POA: Diagnosis not present

## 2017-11-21 DIAGNOSIS — E1142 Type 2 diabetes mellitus with diabetic polyneuropathy: Secondary | ICD-10-CM | POA: Diagnosis not present

## 2017-11-21 DIAGNOSIS — Z4800 Encounter for change or removal of nonsurgical wound dressing: Secondary | ICD-10-CM | POA: Diagnosis not present

## 2017-11-21 DIAGNOSIS — M5416 Radiculopathy, lumbar region: Secondary | ICD-10-CM | POA: Diagnosis not present

## 2017-11-21 DIAGNOSIS — Z466 Encounter for fitting and adjustment of urinary device: Secondary | ICD-10-CM | POA: Diagnosis not present

## 2017-11-21 NOTE — Telephone Encounter (Signed)
[facility] will draw cmet from pt this week for pcp and send [place] copy

## 2017-11-21 NOTE — Telephone Encounter (Signed)
PER PHONE [place]--   WANTS TO KNOW IF DR. [place] ANY LABS DRAWN   PLEASE CALL NICOLE @ [phone]

## 2017-11-23 ENCOUNTER — Other Ambulatory Visit (HOSPITAL_COMMUNITY)
Admission: RE | Admit: 2017-11-23 | Discharge: 2017-11-23 | Disposition: A | Discharge status: Home / Self Care | Payer: Medicare Other | Source: Ambulatory Visit | Attending: Family Medicine | Admitting: Family Medicine

## 2017-11-23 DIAGNOSIS — Z466 Encounter for fitting and adjustment of urinary device: Secondary | ICD-10-CM | POA: Diagnosis not present

## 2017-11-23 DIAGNOSIS — Z79899 Other long term (current) drug therapy: Secondary | ICD-10-CM | POA: Diagnosis not present

## 2017-11-23 DIAGNOSIS — I10 Essential (primary) hypertension: Secondary | ICD-10-CM | POA: Insufficient documentation

## 2017-11-23 DIAGNOSIS — Z8631 Personal history of diabetic foot ulcer: Secondary | ICD-10-CM | POA: Diagnosis not present

## 2017-11-23 DIAGNOSIS — Z4800 Encounter for change or removal of nonsurgical wound dressing: Secondary | ICD-10-CM | POA: Diagnosis not present

## 2017-11-23 DIAGNOSIS — Z7982 Long term (current) use of aspirin: Secondary | ICD-10-CM | POA: Diagnosis not present

## 2017-11-23 DIAGNOSIS — R339 Retention of urine, unspecified: Secondary | ICD-10-CM | POA: Diagnosis not present

## 2017-11-23 DIAGNOSIS — M47816 Spondylosis without myelopathy or radiculopathy, lumbar region: Secondary | ICD-10-CM | POA: Diagnosis not present

## 2017-11-23 DIAGNOSIS — L89312 Pressure ulcer of right buttock, stage 2: Secondary | ICD-10-CM | POA: Diagnosis not present

## 2017-11-23 DIAGNOSIS — M169 Osteoarthritis of hip, unspecified: Secondary | ICD-10-CM | POA: Diagnosis not present

## 2017-11-23 DIAGNOSIS — M25559 Pain in unspecified hip: Secondary | ICD-10-CM | POA: Diagnosis not present

## 2017-11-23 DIAGNOSIS — M5416 Radiculopathy, lumbar region: Secondary | ICD-10-CM | POA: Diagnosis not present

## 2017-11-23 DIAGNOSIS — M129 Arthropathy, unspecified: Secondary | ICD-10-CM | POA: Diagnosis not present

## 2017-11-23 DIAGNOSIS — E1142 Type 2 diabetes mellitus with diabetic polyneuropathy: Secondary | ICD-10-CM | POA: Diagnosis not present

## 2017-11-23 DIAGNOSIS — Z794 Long term (current) use of insulin: Secondary | ICD-10-CM | POA: Diagnosis not present

## 2017-11-23 DIAGNOSIS — Z79891 Long term (current) use of opiate analgesic: Secondary | ICD-10-CM | POA: Diagnosis not present

## 2017-11-23 DIAGNOSIS — G894 Chronic pain syndrome: Secondary | ICD-10-CM | POA: Diagnosis not present

## 2017-11-23 LAB — CBC WITH DIFFERENTIAL/PLATELET
BASOS ABS: 0 10*3/uL (ref 0.0–0.1)
BASOS PCT: 0 %
EOS ABS: 0.4 10*3/uL (ref 0.0–0.7)
EOS PCT: 6 %
HCT: 35.8 % — ABNORMAL LOW (ref 36.0–46.0)
HEMOGLOBIN: 10.8 g/dL — AB (ref 12.0–15.0)
LYMPHS ABS: 1.7 10*3/uL (ref 0.7–4.0)
Lymphocytes Relative: 23 %
MCH: 27.3 pg (ref 26.0–34.0)
MCHC: 30.2 g/dL (ref 30.0–36.0)
MCV: 90.4 fL (ref 78.0–100.0)
Monocytes Absolute: 0.7 10*3/uL (ref 0.1–1.0)
Monocytes Relative: 10 %
NEUTROS PCT: 61 %
Neutro Abs: 4.2 10*3/uL (ref 1.7–7.7)
PLATELETS: 187 10*3/uL (ref 150–400)
RBC: 3.96 MIL/uL (ref 3.87–5.11)
RDW: 14.9 % (ref 11.5–15.5)
WBC: 7 10*3/uL (ref 4.0–10.5)

## 2017-11-23 LAB — COMPREHENSIVE METABOLIC PANEL
ALBUMIN: 3.4 g/dL — AB (ref 3.5–5.0)
ALK PHOS: 79 U/L (ref 38–126)
ALT: 10 U/L (ref 0–44)
AST: 14 U/L — ABNORMAL LOW (ref 15–41)
Anion gap: 11 (ref 5–15)
BUN: 26 mg/dL — ABNORMAL HIGH (ref 8–23)
CALCIUM: 9.5 mg/dL (ref 8.9–10.3)
CHLORIDE: 99 mmol/L (ref 98–111)
CO2: 33 mmol/L — AB (ref 22–32)
CREATININE: 1.16 mg/dL — AB (ref 0.44–1.00)
GFR calc non Af Amer: 47 mL/min — ABNORMAL LOW (ref >60)
GFR, EST AFRICAN AMERICAN: 54 mL/min — AB (ref >60)
Glucose, Bld: 110 mg/dL — ABNORMAL HIGH (ref 70–99)
Potassium: 3.9 mmol/L (ref 3.5–5.1)
SODIUM: 143 mmol/L (ref 135–145)
Total Bilirubin: 0.5 mg/dL (ref 0.3–1.2)
Total Protein: 6.7 g/dL (ref 6.5–8.1)

## 2017-11-23 LAB — TSH: TSH: 4.047 u[IU]/mL (ref 0.350–4.500)

## 2017-11-23 LAB — HEMOGLOBIN A1C
Hgb A1c MFr Bld: 7.7 % — ABNORMAL HIGH (ref 4.8–5.6)
MEAN PLASMA GLUCOSE: 174.29 mg/dL

## 2017-11-25 DIAGNOSIS — T83511A Infection and inflammatory reaction due to indwelling urethral catheter, initial encounter: Secondary | ICD-10-CM | POA: Diagnosis not present

## 2017-11-25 DIAGNOSIS — E11621 Type 2 diabetes mellitus with foot ulcer: Secondary | ICD-10-CM | POA: Diagnosis not present

## 2017-11-25 DIAGNOSIS — N390 Urinary tract infection, site not specified: Secondary | ICD-10-CM | POA: Diagnosis not present

## 2017-11-25 DIAGNOSIS — E1165 Type 2 diabetes mellitus with hyperglycemia: Secondary | ICD-10-CM | POA: Diagnosis not present

## 2017-11-25 DIAGNOSIS — Z7982 Long term (current) use of aspirin: Secondary | ICD-10-CM | POA: Diagnosis not present

## 2017-11-25 DIAGNOSIS — E1142 Type 2 diabetes mellitus with diabetic polyneuropathy: Secondary | ICD-10-CM | POA: Diagnosis not present

## 2017-11-25 DIAGNOSIS — M129 Arthropathy, unspecified: Secondary | ICD-10-CM | POA: Diagnosis not present

## 2017-11-25 DIAGNOSIS — Z79899 Other long term (current) drug therapy: Secondary | ICD-10-CM | POA: Diagnosis not present

## 2017-11-25 DIAGNOSIS — E119 Type 2 diabetes mellitus without complications: Secondary | ICD-10-CM | POA: Diagnosis not present

## 2017-11-25 DIAGNOSIS — T83091A Other mechanical complication of indwelling urethral catheter, initial encounter: Secondary | ICD-10-CM | POA: Diagnosis not present

## 2017-11-25 DIAGNOSIS — T83098A Other mechanical complication of other indwelling urethral catheter, initial encounter: Secondary | ICD-10-CM | POA: Diagnosis not present

## 2017-11-25 DIAGNOSIS — Z794 Long term (current) use of insulin: Secondary | ICD-10-CM | POA: Diagnosis not present

## 2017-11-25 DIAGNOSIS — I10 Essential (primary) hypertension: Secondary | ICD-10-CM | POA: Diagnosis not present

## 2017-11-25 DIAGNOSIS — Z87891 Personal history of nicotine dependence: Secondary | ICD-10-CM | POA: Diagnosis not present

## 2017-11-28 DIAGNOSIS — R339 Retention of urine, unspecified: Secondary | ICD-10-CM | POA: Diagnosis not present

## 2017-11-28 DIAGNOSIS — M5416 Radiculopathy, lumbar region: Secondary | ICD-10-CM | POA: Diagnosis not present

## 2017-11-28 DIAGNOSIS — Z7982 Long term (current) use of aspirin: Secondary | ICD-10-CM | POA: Diagnosis not present

## 2017-11-28 DIAGNOSIS — I10 Essential (primary) hypertension: Secondary | ICD-10-CM | POA: Diagnosis not present

## 2017-11-28 DIAGNOSIS — L89312 Pressure ulcer of right buttock, stage 2: Secondary | ICD-10-CM | POA: Diagnosis not present

## 2017-11-28 DIAGNOSIS — Z466 Encounter for fitting and adjustment of urinary device: Secondary | ICD-10-CM | POA: Diagnosis not present

## 2017-11-28 DIAGNOSIS — Z794 Long term (current) use of insulin: Secondary | ICD-10-CM | POA: Diagnosis not present

## 2017-11-28 DIAGNOSIS — E1142 Type 2 diabetes mellitus with diabetic polyneuropathy: Secondary | ICD-10-CM | POA: Diagnosis not present

## 2017-11-28 DIAGNOSIS — M129 Arthropathy, unspecified: Secondary | ICD-10-CM | POA: Diagnosis not present

## 2017-11-28 DIAGNOSIS — Z8631 Personal history of diabetic foot ulcer: Secondary | ICD-10-CM | POA: Diagnosis not present

## 2017-11-28 DIAGNOSIS — Z79891 Long term (current) use of opiate analgesic: Secondary | ICD-10-CM | POA: Diagnosis not present

## 2017-11-28 DIAGNOSIS — Z4800 Encounter for change or removal of nonsurgical wound dressing: Secondary | ICD-10-CM | POA: Diagnosis not present

## 2017-11-30 DIAGNOSIS — Z8631 Personal history of diabetic foot ulcer: Secondary | ICD-10-CM | POA: Diagnosis not present

## 2017-11-30 DIAGNOSIS — Z7982 Long term (current) use of aspirin: Secondary | ICD-10-CM | POA: Diagnosis not present

## 2017-11-30 DIAGNOSIS — Z794 Long term (current) use of insulin: Secondary | ICD-10-CM | POA: Diagnosis not present

## 2017-11-30 DIAGNOSIS — Z4800 Encounter for change or removal of nonsurgical wound dressing: Secondary | ICD-10-CM | POA: Diagnosis not present

## 2017-11-30 DIAGNOSIS — L89312 Pressure ulcer of right buttock, stage 2: Secondary | ICD-10-CM | POA: Diagnosis not present

## 2017-11-30 DIAGNOSIS — M5416 Radiculopathy, lumbar region: Secondary | ICD-10-CM | POA: Diagnosis not present

## 2017-11-30 DIAGNOSIS — M129 Arthropathy, unspecified: Secondary | ICD-10-CM | POA: Diagnosis not present

## 2017-11-30 DIAGNOSIS — I10 Essential (primary) hypertension: Secondary | ICD-10-CM | POA: Diagnosis not present

## 2017-11-30 DIAGNOSIS — E1142 Type 2 diabetes mellitus with diabetic polyneuropathy: Secondary | ICD-10-CM | POA: Diagnosis not present

## 2017-11-30 DIAGNOSIS — R339 Retention of urine, unspecified: Secondary | ICD-10-CM | POA: Diagnosis not present

## 2017-11-30 DIAGNOSIS — Z79891 Long term (current) use of opiate analgesic: Secondary | ICD-10-CM | POA: Diagnosis not present

## 2017-11-30 DIAGNOSIS — Z466 Encounter for fitting and adjustment of urinary device: Secondary | ICD-10-CM | POA: Diagnosis not present

## 2017-12-02 DIAGNOSIS — R339 Retention of urine, unspecified: Secondary | ICD-10-CM | POA: Diagnosis not present

## 2017-12-02 DIAGNOSIS — M129 Arthropathy, unspecified: Secondary | ICD-10-CM | POA: Diagnosis not present

## 2017-12-02 DIAGNOSIS — Z79891 Long term (current) use of opiate analgesic: Secondary | ICD-10-CM | POA: Diagnosis not present

## 2017-12-02 DIAGNOSIS — E1142 Type 2 diabetes mellitus with diabetic polyneuropathy: Secondary | ICD-10-CM | POA: Diagnosis not present

## 2017-12-02 DIAGNOSIS — Z466 Encounter for fitting and adjustment of urinary device: Secondary | ICD-10-CM | POA: Diagnosis not present

## 2017-12-02 DIAGNOSIS — M5416 Radiculopathy, lumbar region: Secondary | ICD-10-CM | POA: Diagnosis not present

## 2017-12-02 DIAGNOSIS — Z7982 Long term (current) use of aspirin: Secondary | ICD-10-CM | POA: Diagnosis not present

## 2017-12-02 DIAGNOSIS — I10 Essential (primary) hypertension: Secondary | ICD-10-CM | POA: Diagnosis not present

## 2017-12-02 DIAGNOSIS — Z794 Long term (current) use of insulin: Secondary | ICD-10-CM | POA: Diagnosis not present

## 2017-12-02 DIAGNOSIS — Z4800 Encounter for change or removal of nonsurgical wound dressing: Secondary | ICD-10-CM | POA: Diagnosis not present

## 2017-12-02 DIAGNOSIS — Z8631 Personal history of diabetic foot ulcer: Secondary | ICD-10-CM | POA: Diagnosis not present

## 2017-12-02 DIAGNOSIS — L89312 Pressure ulcer of right buttock, stage 2: Secondary | ICD-10-CM | POA: Diagnosis not present

## 2017-12-06 DIAGNOSIS — L89312 Pressure ulcer of right buttock, stage 2: Secondary | ICD-10-CM | POA: Diagnosis not present

## 2017-12-06 DIAGNOSIS — E1142 Type 2 diabetes mellitus with diabetic polyneuropathy: Secondary | ICD-10-CM | POA: Diagnosis not present

## 2017-12-06 DIAGNOSIS — Z8631 Personal history of diabetic foot ulcer: Secondary | ICD-10-CM | POA: Diagnosis not present

## 2017-12-06 DIAGNOSIS — M5416 Radiculopathy, lumbar region: Secondary | ICD-10-CM | POA: Diagnosis not present

## 2017-12-06 DIAGNOSIS — Z466 Encounter for fitting and adjustment of urinary device: Secondary | ICD-10-CM | POA: Diagnosis not present

## 2017-12-06 DIAGNOSIS — M129 Arthropathy, unspecified: Secondary | ICD-10-CM | POA: Diagnosis not present

## 2017-12-06 DIAGNOSIS — R339 Retention of urine, unspecified: Secondary | ICD-10-CM | POA: Diagnosis not present

## 2017-12-06 DIAGNOSIS — Z7982 Long term (current) use of aspirin: Secondary | ICD-10-CM | POA: Diagnosis not present

## 2017-12-06 DIAGNOSIS — Z4800 Encounter for change or removal of nonsurgical wound dressing: Secondary | ICD-10-CM | POA: Diagnosis not present

## 2017-12-06 DIAGNOSIS — Z794 Long term (current) use of insulin: Secondary | ICD-10-CM | POA: Diagnosis not present

## 2017-12-06 DIAGNOSIS — I10 Essential (primary) hypertension: Secondary | ICD-10-CM | POA: Diagnosis not present

## 2017-12-06 DIAGNOSIS — Z79891 Long term (current) use of opiate analgesic: Secondary | ICD-10-CM | POA: Diagnosis not present

## 2017-12-07 DIAGNOSIS — I10 Essential (primary) hypertension: Secondary | ICD-10-CM | POA: Diagnosis not present

## 2017-12-07 DIAGNOSIS — M129 Arthropathy, unspecified: Secondary | ICD-10-CM | POA: Diagnosis not present

## 2017-12-07 DIAGNOSIS — R339 Retention of urine, unspecified: Secondary | ICD-10-CM | POA: Diagnosis not present

## 2017-12-07 DIAGNOSIS — E1142 Type 2 diabetes mellitus with diabetic polyneuropathy: Secondary | ICD-10-CM | POA: Diagnosis not present

## 2017-12-07 DIAGNOSIS — L89312 Pressure ulcer of right buttock, stage 2: Secondary | ICD-10-CM | POA: Diagnosis not present

## 2017-12-07 DIAGNOSIS — Z8631 Personal history of diabetic foot ulcer: Secondary | ICD-10-CM | POA: Diagnosis not present

## 2017-12-07 DIAGNOSIS — M5416 Radiculopathy, lumbar region: Secondary | ICD-10-CM | POA: Diagnosis not present

## 2017-12-07 DIAGNOSIS — Z7982 Long term (current) use of aspirin: Secondary | ICD-10-CM | POA: Diagnosis not present

## 2017-12-07 DIAGNOSIS — Z79891 Long term (current) use of opiate analgesic: Secondary | ICD-10-CM | POA: Diagnosis not present

## 2017-12-07 DIAGNOSIS — Z794 Long term (current) use of insulin: Secondary | ICD-10-CM | POA: Diagnosis not present

## 2017-12-07 DIAGNOSIS — Z466 Encounter for fitting and adjustment of urinary device: Secondary | ICD-10-CM | POA: Diagnosis not present

## 2017-12-07 DIAGNOSIS — Z4800 Encounter for change or removal of nonsurgical wound dressing: Secondary | ICD-10-CM | POA: Diagnosis not present

## 2017-12-09 DIAGNOSIS — Z794 Long term (current) use of insulin: Secondary | ICD-10-CM | POA: Diagnosis not present

## 2017-12-09 DIAGNOSIS — E1142 Type 2 diabetes mellitus with diabetic polyneuropathy: Secondary | ICD-10-CM | POA: Diagnosis not present

## 2017-12-09 DIAGNOSIS — M129 Arthropathy, unspecified: Secondary | ICD-10-CM | POA: Diagnosis not present

## 2017-12-09 DIAGNOSIS — Z79891 Long term (current) use of opiate analgesic: Secondary | ICD-10-CM | POA: Diagnosis not present

## 2017-12-09 DIAGNOSIS — Z4800 Encounter for change or removal of nonsurgical wound dressing: Secondary | ICD-10-CM | POA: Diagnosis not present

## 2017-12-09 DIAGNOSIS — I10 Essential (primary) hypertension: Secondary | ICD-10-CM | POA: Diagnosis not present

## 2017-12-09 DIAGNOSIS — Z8631 Personal history of diabetic foot ulcer: Secondary | ICD-10-CM | POA: Diagnosis not present

## 2017-12-09 DIAGNOSIS — M5416 Radiculopathy, lumbar region: Secondary | ICD-10-CM | POA: Diagnosis not present

## 2017-12-09 DIAGNOSIS — Z466 Encounter for fitting and adjustment of urinary device: Secondary | ICD-10-CM | POA: Diagnosis not present

## 2017-12-09 DIAGNOSIS — R339 Retention of urine, unspecified: Secondary | ICD-10-CM | POA: Diagnosis not present

## 2017-12-09 DIAGNOSIS — Z7982 Long term (current) use of aspirin: Secondary | ICD-10-CM | POA: Diagnosis not present

## 2017-12-09 DIAGNOSIS — L89312 Pressure ulcer of right buttock, stage 2: Secondary | ICD-10-CM | POA: Diagnosis not present

## 2017-12-12 DIAGNOSIS — Z4800 Encounter for change or removal of nonsurgical wound dressing: Secondary | ICD-10-CM | POA: Diagnosis not present

## 2017-12-12 DIAGNOSIS — Z7982 Long term (current) use of aspirin: Secondary | ICD-10-CM | POA: Diagnosis not present

## 2017-12-12 DIAGNOSIS — Z794 Long term (current) use of insulin: Secondary | ICD-10-CM | POA: Diagnosis not present

## 2017-12-12 DIAGNOSIS — Z466 Encounter for fitting and adjustment of urinary device: Secondary | ICD-10-CM | POA: Diagnosis not present

## 2017-12-12 DIAGNOSIS — Z79891 Long term (current) use of opiate analgesic: Secondary | ICD-10-CM | POA: Diagnosis not present

## 2017-12-12 DIAGNOSIS — Z8631 Personal history of diabetic foot ulcer: Secondary | ICD-10-CM | POA: Diagnosis not present

## 2017-12-12 DIAGNOSIS — M5416 Radiculopathy, lumbar region: Secondary | ICD-10-CM | POA: Diagnosis not present

## 2017-12-12 DIAGNOSIS — E1142 Type 2 diabetes mellitus with diabetic polyneuropathy: Secondary | ICD-10-CM | POA: Diagnosis not present

## 2017-12-12 DIAGNOSIS — M129 Arthropathy, unspecified: Secondary | ICD-10-CM | POA: Diagnosis not present

## 2017-12-12 DIAGNOSIS — I10 Essential (primary) hypertension: Secondary | ICD-10-CM | POA: Diagnosis not present

## 2017-12-12 DIAGNOSIS — L89312 Pressure ulcer of right buttock, stage 2: Secondary | ICD-10-CM | POA: Diagnosis not present

## 2017-12-12 DIAGNOSIS — R339 Retention of urine, unspecified: Secondary | ICD-10-CM | POA: Diagnosis not present

## 2017-12-14 DIAGNOSIS — Z466 Encounter for fitting and adjustment of urinary device: Secondary | ICD-10-CM | POA: Diagnosis not present

## 2017-12-14 DIAGNOSIS — E1142 Type 2 diabetes mellitus with diabetic polyneuropathy: Secondary | ICD-10-CM | POA: Diagnosis not present

## 2017-12-14 DIAGNOSIS — Z79891 Long term (current) use of opiate analgesic: Secondary | ICD-10-CM | POA: Diagnosis not present

## 2017-12-14 DIAGNOSIS — R339 Retention of urine, unspecified: Secondary | ICD-10-CM | POA: Diagnosis not present

## 2017-12-14 DIAGNOSIS — L89312 Pressure ulcer of right buttock, stage 2: Secondary | ICD-10-CM | POA: Diagnosis not present

## 2017-12-14 DIAGNOSIS — Z7982 Long term (current) use of aspirin: Secondary | ICD-10-CM | POA: Diagnosis not present

## 2017-12-14 DIAGNOSIS — I10 Essential (primary) hypertension: Secondary | ICD-10-CM | POA: Diagnosis not present

## 2017-12-14 DIAGNOSIS — Z8631 Personal history of diabetic foot ulcer: Secondary | ICD-10-CM | POA: Diagnosis not present

## 2017-12-14 DIAGNOSIS — M129 Arthropathy, unspecified: Secondary | ICD-10-CM | POA: Diagnosis not present

## 2017-12-14 DIAGNOSIS — Z4800 Encounter for change or removal of nonsurgical wound dressing: Secondary | ICD-10-CM | POA: Diagnosis not present

## 2017-12-14 DIAGNOSIS — Z794 Long term (current) use of insulin: Secondary | ICD-10-CM | POA: Diagnosis not present

## 2017-12-14 DIAGNOSIS — M5416 Radiculopathy, lumbar region: Secondary | ICD-10-CM | POA: Diagnosis not present

## 2017-12-16 DIAGNOSIS — Z466 Encounter for fitting and adjustment of urinary device: Secondary | ICD-10-CM | POA: Diagnosis not present

## 2017-12-16 DIAGNOSIS — R339 Retention of urine, unspecified: Secondary | ICD-10-CM | POA: Diagnosis not present

## 2017-12-16 DIAGNOSIS — L89313 Pressure ulcer of right buttock, stage 3: Secondary | ICD-10-CM | POA: Diagnosis not present

## 2017-12-19 DIAGNOSIS — M5416 Radiculopathy, lumbar region: Secondary | ICD-10-CM | POA: Diagnosis not present

## 2017-12-19 DIAGNOSIS — Z79891 Long term (current) use of opiate analgesic: Secondary | ICD-10-CM | POA: Diagnosis not present

## 2017-12-19 DIAGNOSIS — Z466 Encounter for fitting and adjustment of urinary device: Secondary | ICD-10-CM | POA: Diagnosis not present

## 2017-12-19 DIAGNOSIS — Z4800 Encounter for change or removal of nonsurgical wound dressing: Secondary | ICD-10-CM | POA: Diagnosis not present

## 2017-12-19 DIAGNOSIS — M129 Arthropathy, unspecified: Secondary | ICD-10-CM | POA: Diagnosis not present

## 2017-12-19 DIAGNOSIS — Z794 Long term (current) use of insulin: Secondary | ICD-10-CM | POA: Diagnosis not present

## 2017-12-19 DIAGNOSIS — L89312 Pressure ulcer of right buttock, stage 2: Secondary | ICD-10-CM | POA: Diagnosis not present

## 2017-12-19 DIAGNOSIS — Z8631 Personal history of diabetic foot ulcer: Secondary | ICD-10-CM | POA: Diagnosis not present

## 2017-12-19 DIAGNOSIS — E1142 Type 2 diabetes mellitus with diabetic polyneuropathy: Secondary | ICD-10-CM | POA: Diagnosis not present

## 2017-12-19 DIAGNOSIS — I10 Essential (primary) hypertension: Secondary | ICD-10-CM | POA: Diagnosis not present

## 2017-12-19 DIAGNOSIS — Z7982 Long term (current) use of aspirin: Secondary | ICD-10-CM | POA: Diagnosis not present

## 2017-12-19 DIAGNOSIS — R339 Retention of urine, unspecified: Secondary | ICD-10-CM | POA: Diagnosis not present

## 2017-12-21 DIAGNOSIS — L89312 Pressure ulcer of right buttock, stage 2: Secondary | ICD-10-CM | POA: Diagnosis not present

## 2017-12-21 DIAGNOSIS — E1142 Type 2 diabetes mellitus with diabetic polyneuropathy: Secondary | ICD-10-CM | POA: Diagnosis not present

## 2017-12-21 DIAGNOSIS — R339 Retention of urine, unspecified: Secondary | ICD-10-CM | POA: Diagnosis not present

## 2017-12-21 DIAGNOSIS — G894 Chronic pain syndrome: Secondary | ICD-10-CM | POA: Diagnosis not present

## 2017-12-21 DIAGNOSIS — Z7982 Long term (current) use of aspirin: Secondary | ICD-10-CM | POA: Diagnosis not present

## 2017-12-21 DIAGNOSIS — Z794 Long term (current) use of insulin: Secondary | ICD-10-CM | POA: Diagnosis not present

## 2017-12-21 DIAGNOSIS — M47816 Spondylosis without myelopathy or radiculopathy, lumbar region: Secondary | ICD-10-CM | POA: Diagnosis not present

## 2017-12-21 DIAGNOSIS — Z8631 Personal history of diabetic foot ulcer: Secondary | ICD-10-CM | POA: Diagnosis not present

## 2017-12-21 DIAGNOSIS — M129 Arthropathy, unspecified: Secondary | ICD-10-CM | POA: Diagnosis not present

## 2017-12-21 DIAGNOSIS — Z79899 Other long term (current) drug therapy: Secondary | ICD-10-CM | POA: Diagnosis not present

## 2017-12-21 DIAGNOSIS — Z79891 Long term (current) use of opiate analgesic: Secondary | ICD-10-CM | POA: Diagnosis not present

## 2017-12-21 DIAGNOSIS — I10 Essential (primary) hypertension: Secondary | ICD-10-CM | POA: Diagnosis not present

## 2017-12-21 DIAGNOSIS — M169 Osteoarthritis of hip, unspecified: Secondary | ICD-10-CM | POA: Diagnosis not present

## 2017-12-21 DIAGNOSIS — M5416 Radiculopathy, lumbar region: Secondary | ICD-10-CM | POA: Diagnosis not present

## 2017-12-21 DIAGNOSIS — Z4800 Encounter for change or removal of nonsurgical wound dressing: Secondary | ICD-10-CM | POA: Diagnosis not present

## 2017-12-21 DIAGNOSIS — Z466 Encounter for fitting and adjustment of urinary device: Secondary | ICD-10-CM | POA: Diagnosis not present

## 2017-12-22 DIAGNOSIS — M129 Arthropathy, unspecified: Secondary | ICD-10-CM | POA: Diagnosis not present

## 2017-12-22 DIAGNOSIS — Z8631 Personal history of diabetic foot ulcer: Secondary | ICD-10-CM | POA: Diagnosis not present

## 2017-12-22 DIAGNOSIS — Z4800 Encounter for change or removal of nonsurgical wound dressing: Secondary | ICD-10-CM | POA: Diagnosis not present

## 2017-12-22 DIAGNOSIS — Z466 Encounter for fitting and adjustment of urinary device: Secondary | ICD-10-CM | POA: Diagnosis not present

## 2017-12-22 DIAGNOSIS — E1142 Type 2 diabetes mellitus with diabetic polyneuropathy: Secondary | ICD-10-CM | POA: Diagnosis not present

## 2017-12-22 DIAGNOSIS — Z794 Long term (current) use of insulin: Secondary | ICD-10-CM | POA: Diagnosis not present

## 2017-12-22 DIAGNOSIS — Z7982 Long term (current) use of aspirin: Secondary | ICD-10-CM | POA: Diagnosis not present

## 2017-12-22 DIAGNOSIS — L89312 Pressure ulcer of right buttock, stage 2: Secondary | ICD-10-CM | POA: Diagnosis not present

## 2017-12-22 DIAGNOSIS — R339 Retention of urine, unspecified: Secondary | ICD-10-CM | POA: Diagnosis not present

## 2017-12-22 DIAGNOSIS — Z79891 Long term (current) use of opiate analgesic: Secondary | ICD-10-CM | POA: Diagnosis not present

## 2017-12-22 DIAGNOSIS — M5416 Radiculopathy, lumbar region: Secondary | ICD-10-CM | POA: Diagnosis not present

## 2017-12-22 DIAGNOSIS — I10 Essential (primary) hypertension: Secondary | ICD-10-CM | POA: Diagnosis not present

## 2017-12-24 DIAGNOSIS — R339 Retention of urine, unspecified: Secondary | ICD-10-CM | POA: Diagnosis not present

## 2017-12-24 DIAGNOSIS — Z7982 Long term (current) use of aspirin: Secondary | ICD-10-CM | POA: Diagnosis not present

## 2017-12-24 DIAGNOSIS — I10 Essential (primary) hypertension: Secondary | ICD-10-CM | POA: Diagnosis not present

## 2017-12-24 DIAGNOSIS — E1142 Type 2 diabetes mellitus with diabetic polyneuropathy: Secondary | ICD-10-CM | POA: Diagnosis not present

## 2017-12-24 DIAGNOSIS — L89312 Pressure ulcer of right buttock, stage 2: Secondary | ICD-10-CM | POA: Diagnosis not present

## 2017-12-24 DIAGNOSIS — M129 Arthropathy, unspecified: Secondary | ICD-10-CM | POA: Diagnosis not present

## 2017-12-24 DIAGNOSIS — Z79891 Long term (current) use of opiate analgesic: Secondary | ICD-10-CM | POA: Diagnosis not present

## 2017-12-24 DIAGNOSIS — Z794 Long term (current) use of insulin: Secondary | ICD-10-CM | POA: Diagnosis not present

## 2017-12-24 DIAGNOSIS — M5416 Radiculopathy, lumbar region: Secondary | ICD-10-CM | POA: Diagnosis not present

## 2017-12-24 DIAGNOSIS — Z8631 Personal history of diabetic foot ulcer: Secondary | ICD-10-CM | POA: Diagnosis not present

## 2017-12-24 DIAGNOSIS — Z4800 Encounter for change or removal of nonsurgical wound dressing: Secondary | ICD-10-CM | POA: Diagnosis not present

## 2017-12-24 DIAGNOSIS — Z466 Encounter for fitting and adjustment of urinary device: Secondary | ICD-10-CM | POA: Diagnosis not present

## 2017-12-26 DIAGNOSIS — Z794 Long term (current) use of insulin: Secondary | ICD-10-CM | POA: Diagnosis not present

## 2017-12-26 DIAGNOSIS — Z8631 Personal history of diabetic foot ulcer: Secondary | ICD-10-CM | POA: Diagnosis not present

## 2017-12-26 DIAGNOSIS — Z466 Encounter for fitting and adjustment of urinary device: Secondary | ICD-10-CM | POA: Diagnosis not present

## 2017-12-26 DIAGNOSIS — I10 Essential (primary) hypertension: Secondary | ICD-10-CM | POA: Diagnosis not present

## 2017-12-26 DIAGNOSIS — R339 Retention of urine, unspecified: Secondary | ICD-10-CM | POA: Diagnosis not present

## 2017-12-26 DIAGNOSIS — M129 Arthropathy, unspecified: Secondary | ICD-10-CM | POA: Diagnosis not present

## 2017-12-26 DIAGNOSIS — Z7982 Long term (current) use of aspirin: Secondary | ICD-10-CM | POA: Diagnosis not present

## 2017-12-26 DIAGNOSIS — Z79891 Long term (current) use of opiate analgesic: Secondary | ICD-10-CM | POA: Diagnosis not present

## 2017-12-26 DIAGNOSIS — M5416 Radiculopathy, lumbar region: Secondary | ICD-10-CM | POA: Diagnosis not present

## 2017-12-26 DIAGNOSIS — E1142 Type 2 diabetes mellitus with diabetic polyneuropathy: Secondary | ICD-10-CM | POA: Diagnosis not present

## 2017-12-26 DIAGNOSIS — L89312 Pressure ulcer of right buttock, stage 2: Secondary | ICD-10-CM | POA: Diagnosis not present

## 2017-12-26 DIAGNOSIS — Z4800 Encounter for change or removal of nonsurgical wound dressing: Secondary | ICD-10-CM | POA: Diagnosis not present

## 2017-12-28 DIAGNOSIS — I10 Essential (primary) hypertension: Secondary | ICD-10-CM | POA: Diagnosis not present

## 2017-12-28 DIAGNOSIS — Z79891 Long term (current) use of opiate analgesic: Secondary | ICD-10-CM | POA: Diagnosis not present

## 2017-12-28 DIAGNOSIS — Z794 Long term (current) use of insulin: Secondary | ICD-10-CM | POA: Diagnosis not present

## 2017-12-28 DIAGNOSIS — M5416 Radiculopathy, lumbar region: Secondary | ICD-10-CM | POA: Diagnosis not present

## 2017-12-28 DIAGNOSIS — Z4800 Encounter for change or removal of nonsurgical wound dressing: Secondary | ICD-10-CM | POA: Diagnosis not present

## 2017-12-28 DIAGNOSIS — Z7982 Long term (current) use of aspirin: Secondary | ICD-10-CM | POA: Diagnosis not present

## 2017-12-28 DIAGNOSIS — Z466 Encounter for fitting and adjustment of urinary device: Secondary | ICD-10-CM | POA: Diagnosis not present

## 2017-12-28 DIAGNOSIS — L89312 Pressure ulcer of right buttock, stage 2: Secondary | ICD-10-CM | POA: Diagnosis not present

## 2017-12-28 DIAGNOSIS — M129 Arthropathy, unspecified: Secondary | ICD-10-CM | POA: Diagnosis not present

## 2017-12-28 DIAGNOSIS — R339 Retention of urine, unspecified: Secondary | ICD-10-CM | POA: Diagnosis not present

## 2017-12-28 DIAGNOSIS — E1142 Type 2 diabetes mellitus with diabetic polyneuropathy: Secondary | ICD-10-CM | POA: Diagnosis not present

## 2017-12-28 DIAGNOSIS — Z8631 Personal history of diabetic foot ulcer: Secondary | ICD-10-CM | POA: Diagnosis not present

## 2017-12-30 ENCOUNTER — Ambulatory Visit: Payer: Medicare Other | Admitting: Cardiology

## 2017-12-30 ENCOUNTER — Encounter: Payer: Self-pay | Admitting: Cardiology

## 2017-12-30 VITALS — BP 109/68 | HR 84 | Ht 70.0 in | Wt 292.8 lb

## 2017-12-30 DIAGNOSIS — Z794 Long term (current) use of insulin: Secondary | ICD-10-CM | POA: Diagnosis not present

## 2017-12-30 DIAGNOSIS — I10 Essential (primary) hypertension: Secondary | ICD-10-CM | POA: Diagnosis not present

## 2017-12-30 DIAGNOSIS — Z23 Encounter for immunization: Secondary | ICD-10-CM | POA: Diagnosis not present

## 2017-12-30 DIAGNOSIS — E1142 Type 2 diabetes mellitus with diabetic polyneuropathy: Secondary | ICD-10-CM | POA: Diagnosis not present

## 2017-12-30 DIAGNOSIS — I5033 Acute on chronic diastolic (congestive) heart failure: Secondary | ICD-10-CM

## 2017-12-30 DIAGNOSIS — R600 Localized edema: Secondary | ICD-10-CM

## 2017-12-30 DIAGNOSIS — L89312 Pressure ulcer of right buttock, stage 2: Secondary | ICD-10-CM | POA: Diagnosis not present

## 2017-12-30 DIAGNOSIS — Z79891 Long term (current) use of opiate analgesic: Secondary | ICD-10-CM | POA: Diagnosis not present

## 2017-12-30 DIAGNOSIS — M5416 Radiculopathy, lumbar region: Secondary | ICD-10-CM | POA: Diagnosis not present

## 2017-12-30 DIAGNOSIS — R339 Retention of urine, unspecified: Secondary | ICD-10-CM | POA: Diagnosis not present

## 2017-12-30 DIAGNOSIS — Z4800 Encounter for change or removal of nonsurgical wound dressing: Secondary | ICD-10-CM | POA: Diagnosis not present

## 2017-12-30 DIAGNOSIS — Z7982 Long term (current) use of aspirin: Secondary | ICD-10-CM | POA: Diagnosis not present

## 2017-12-30 DIAGNOSIS — Z8631 Personal history of diabetic foot ulcer: Secondary | ICD-10-CM | POA: Diagnosis not present

## 2017-12-30 DIAGNOSIS — M129 Arthropathy, unspecified: Secondary | ICD-10-CM | POA: Diagnosis not present

## 2017-12-30 DIAGNOSIS — Z466 Encounter for fitting and adjustment of urinary device: Secondary | ICD-10-CM | POA: Diagnosis not present

## 2017-12-30 MED ORDER — TORSEMIDE 20 MG PO TABS: ORAL_TABLET | ORAL | 3 refills | Status: AC

## 2017-12-30 NOTE — Patient Instructions (Signed)
Your physician recommends that you schedule a follow-up appointment in: [place]   Your physician has recommended you make the following change in your medication:   DECREASE TORSEMIDE 40 MG IN THE MORNING AND 20 MG IN THE EVENING   Your physician recommends that you return for lab work BMP/MG  Thank you for choosing [facility]!!

## 2017-12-30 NOTE — Progress Notes (Signed)
Clinical Summary Ms. Mcneece is a 72 y.o.female seen today for a focused visit on her history of diastolic HF and leg edema.    1. LE edema/Chronic diastolic HF - 27/2536 echo LVEF LVEF 55-60%, grade II diastoilc dysfunction - weight had decreased from 296 to 283 lbs in 05/2017. Today trended back up to 291 lbs.  Last visti she had increased weight and edema, was not responding to high dose lasix.  - last visit we changed to toresemide 40mg  bid. Stable labs from last check - still with some swelling at tmes. Has had some muscle cramps on torsemide.  - legs are being wrapped by home health.    Past Medical History:  Diagnosis Date  . Anxiety   . Cancer ([place])   . Depression   . Diabetes mellitus   . Dysrhythmia   . Fibromyalgia   . Guaiac positive stools   . Hypertension   . Neuromuscular disorder ([place])   . Reflux   . Ruptured lumbar disc    x2  . Shortness of breath   . Spinal stenosis      Allergies  Allergen Reactions  . Macrodantin [Nitrofurantoin]      Current Outpatient Medications  Medication Sig Dispense Refill  . aspirin EC 81 MG tablet Take 81 mg by mouth daily.    . cholecalciferol (VITAMIN D) 400 units TABS tablet Take 400 Units by mouth daily.    Marland Kitchen diltiazem (CARDIZEM) 60 MG tablet TAKE 2 TABLETS BY MOUTH TWICE DAILY 180 tablet 3  . enalapril (VASOTEC) 20 MG tablet Take 20 mg by mouth daily.    . furosemide (LASIX) 80 MG tablet TAKE 1 TABLET EVERY OTHER DAY ALTERNATING WITH 60 MG 45 tablet 1  . gabapentin (NEURONTIN) 600 MG tablet Take 600 mg by mouth 3 (three) times daily.    Marland Kitchen glyBURIDE (DIABETA) 5 MG tablet Take 10 mg by mouth daily with breakfast.     . insulin glargine (LANTUS) 100 UNIT/ML injection Inject 32 Units into the skin 2 (two) times daily.     . magnesium oxide (MAG-OX) 400 MG tablet TAKE 1 TABLET 2 TIMES DAILY FOR 4 DAYS THEN TAKE 1 TABLET DAILY 90 tablet 1  . metFORMIN (GLUCOPHAGE) 1000 MG tablet Take 1,000 mg by mouth 2 (two)  times daily.     . metoprolol succinate (TOPROL-XL) 50 MG 24 hr tablet TAKE 1 TABLET BY MOUTH ONCE DAILY TAKE  WITH  OR  IMMEDIATELY  FOLLOWING  A  MEAL 90 tablet 1  . ondansetron (ZOFRAN) 8 MG tablet Take 8 mg by mouth every 8 (eight) hours as needed for nausea or vomiting.    Marland Kitchen oxyCODONE-acetaminophen (PERCOCET) 10-325 MG tablet Take 1 tablet by mouth every 6 (six) hours as needed for pain.     . protein supplement (PROMOD) POWD Take 1 scoop by mouth 2 (two) times daily.    Marland Kitchen torsemide (DEMADEX) 20 MG tablet Take 2 tablets (40 mg total) by mouth 2 (two) times daily. 120 tablet 1  . vitamin B-12 (CYANOCOBALAMIN) 1000 MCG tablet Take 1,000 mcg by mouth daily.    . vitamin C (ASCORBIC ACID) 500 MG tablet Take 500 mg by mouth daily.     No current facility-administered medications for this visit.      Past Surgical History:  Procedure Laterality Date  . ABDOMINAL HYSTERECTOMY    . APPENDECTOMY    . CARPAL TUNNEL RELEASE Bilateral   . CHOLECYSTECTOMY    .  COLONOSCOPY  10/21/2010   Procedure: COLONOSCOPY;  Surgeon: Rogene Houston, MD;  Location: [facility];  Service: Endoscopy;  Laterality: N/A;  . COLONOSCOPY  10/21/2010  . ESOPHAGOGASTRODUODENOSCOPY  10/21/2010   Procedure: ESOPHAGOGASTRODUODENOSCOPY (EGD);  Surgeon: Rogene Houston, MD;  Location: [facility];  Service: Endoscopy;  Laterality: N/A;  . EYE SURGERY    . TONSILLECTOMY  AGE 41     Allergies  Allergen Reactions  . Macrodantin [Nitrofurantoin]       No family history on file.   Social History Ms. Mcmannis reports that she has quit smoking. She has never used smokeless tobacco. Ms. Wiatrek reports that she does not drink alcohol.   Review of Systems CONSTITUTIONAL: No weight loss, fever, chills, weakness or fatigue.  HEENT: Eyes: No visual loss, blurred vision, double vision or yellow sclerae.No hearing loss, sneezing, congestion, runny nose or sore throat.  SKIN: No rash or itching.  CARDIOVASCULAR: no chest  pain, no palpitations.  RESPIRATORY: No shortness of breath, cough or sputum.  GASTROINTESTINAL: No anorexia, nausea, vomiting or diarrhea. No abdominal pain or blood.  GENITOURINARY: No burning on urination, no polyuria NEUROLOGICAL: No headache, dizziness, syncope, paralysis, ataxia, numbness or tingling in the extremities. No change in bowel or bladder control.  MUSCULOSKELETAL: No muscle, back pain, joint pain or stiffness.  LYMPHATICS: No enlarged nodes. No history of splenectomy.  PSYCHIATRIC: No history of depression or anxiety.  ENDOCRINOLOGIC: No reports of sweating, cold or heat intolerance. No polyuria or polydipsia.  Marland Kitchen   Physical Examination Vitals:   12/30/17 1358  BP: 109/68  Pulse: 84  SpO2: 98%   Vitals:   12/30/17 1358  Weight: 292 lb 12.8 oz (132.8 kg)  Height: 5\' 10"  (1.778 m)    Gen: resting comfortably, no acute distress HEENT: no scleral icterus, pupils equal round and reactive, no palptable cervical adenopathy,  CV: RRR, no m/r/g, no jvd Resp: Clear to auscultation bilaterally GI: abdomen is soft, non-tender, non-distended, normal bowel sounds, no hepatosplenomegaly MSK: extremities are warm, legs are wrapped Skin: warm, no rash Neuro:  no focal deficits Psych: appropriate affect   Diagnostic Studies  01/2017 echo LVEF LVEF 55-60%, grade II diastoilc dysfunction    Assessment and Plan  1. LE edema/Acute on chornic diastolic HF - down 10 lbs since changing to torsemide. Some muscle cramps, lower dose to 40mg  in AM and 20mg  in PM. Still appears to have some further diuresis to do - repeat BMET/Mg, will try to arrange with her home health.      Nursing visit 3 weeks for vitals and weight check - F/u with me 3 months      Arnoldo Lenis, M.D.

## 2018-01-02 DIAGNOSIS — Z466 Encounter for fitting and adjustment of urinary device: Secondary | ICD-10-CM | POA: Diagnosis not present

## 2018-01-02 DIAGNOSIS — M5416 Radiculopathy, lumbar region: Secondary | ICD-10-CM | POA: Diagnosis not present

## 2018-01-02 DIAGNOSIS — Z794 Long term (current) use of insulin: Secondary | ICD-10-CM | POA: Diagnosis not present

## 2018-01-02 DIAGNOSIS — L89312 Pressure ulcer of right buttock, stage 2: Secondary | ICD-10-CM | POA: Diagnosis not present

## 2018-01-02 DIAGNOSIS — Z7982 Long term (current) use of aspirin: Secondary | ICD-10-CM | POA: Diagnosis not present

## 2018-01-02 DIAGNOSIS — M129 Arthropathy, unspecified: Secondary | ICD-10-CM | POA: Diagnosis not present

## 2018-01-02 DIAGNOSIS — R339 Retention of urine, unspecified: Secondary | ICD-10-CM | POA: Diagnosis not present

## 2018-01-02 DIAGNOSIS — Z79891 Long term (current) use of opiate analgesic: Secondary | ICD-10-CM | POA: Diagnosis not present

## 2018-01-02 DIAGNOSIS — Z4800 Encounter for change or removal of nonsurgical wound dressing: Secondary | ICD-10-CM | POA: Diagnosis not present

## 2018-01-02 DIAGNOSIS — I10 Essential (primary) hypertension: Secondary | ICD-10-CM | POA: Diagnosis not present

## 2018-01-02 DIAGNOSIS — E1142 Type 2 diabetes mellitus with diabetic polyneuropathy: Secondary | ICD-10-CM | POA: Diagnosis not present

## 2018-01-02 DIAGNOSIS — Z8631 Personal history of diabetic foot ulcer: Secondary | ICD-10-CM | POA: Diagnosis not present

## 2018-01-04 DIAGNOSIS — Z466 Encounter for fitting and adjustment of urinary device: Secondary | ICD-10-CM | POA: Diagnosis not present

## 2018-01-04 DIAGNOSIS — Z8631 Personal history of diabetic foot ulcer: Secondary | ICD-10-CM | POA: Diagnosis not present

## 2018-01-04 DIAGNOSIS — I10 Essential (primary) hypertension: Secondary | ICD-10-CM | POA: Diagnosis not present

## 2018-01-04 DIAGNOSIS — Z79891 Long term (current) use of opiate analgesic: Secondary | ICD-10-CM | POA: Diagnosis not present

## 2018-01-04 DIAGNOSIS — R339 Retention of urine, unspecified: Secondary | ICD-10-CM | POA: Diagnosis not present

## 2018-01-04 DIAGNOSIS — Z794 Long term (current) use of insulin: Secondary | ICD-10-CM | POA: Diagnosis not present

## 2018-01-04 DIAGNOSIS — Z4800 Encounter for change or removal of nonsurgical wound dressing: Secondary | ICD-10-CM | POA: Diagnosis not present

## 2018-01-04 DIAGNOSIS — E1142 Type 2 diabetes mellitus with diabetic polyneuropathy: Secondary | ICD-10-CM | POA: Diagnosis not present

## 2018-01-04 DIAGNOSIS — M5416 Radiculopathy, lumbar region: Secondary | ICD-10-CM | POA: Diagnosis not present

## 2018-01-04 DIAGNOSIS — L89312 Pressure ulcer of right buttock, stage 2: Secondary | ICD-10-CM | POA: Diagnosis not present

## 2018-01-04 DIAGNOSIS — Z7982 Long term (current) use of aspirin: Secondary | ICD-10-CM | POA: Diagnosis not present

## 2018-01-04 DIAGNOSIS — R600 Localized edema: Secondary | ICD-10-CM | POA: Diagnosis not present

## 2018-01-05 DIAGNOSIS — Z7982 Long term (current) use of aspirin: Secondary | ICD-10-CM | POA: Diagnosis not present

## 2018-01-05 DIAGNOSIS — Z794 Long term (current) use of insulin: Secondary | ICD-10-CM | POA: Diagnosis not present

## 2018-01-05 DIAGNOSIS — I10 Essential (primary) hypertension: Secondary | ICD-10-CM | POA: Diagnosis not present

## 2018-01-05 DIAGNOSIS — R600 Localized edema: Secondary | ICD-10-CM | POA: Diagnosis not present

## 2018-01-05 DIAGNOSIS — Z8631 Personal history of diabetic foot ulcer: Secondary | ICD-10-CM | POA: Diagnosis not present

## 2018-01-05 DIAGNOSIS — L89312 Pressure ulcer of right buttock, stage 2: Secondary | ICD-10-CM | POA: Diagnosis not present

## 2018-01-05 DIAGNOSIS — Z466 Encounter for fitting and adjustment of urinary device: Secondary | ICD-10-CM | POA: Diagnosis not present

## 2018-01-05 DIAGNOSIS — Z4800 Encounter for change or removal of nonsurgical wound dressing: Secondary | ICD-10-CM | POA: Diagnosis not present

## 2018-01-05 DIAGNOSIS — Z79891 Long term (current) use of opiate analgesic: Secondary | ICD-10-CM | POA: Diagnosis not present

## 2018-01-05 DIAGNOSIS — M5416 Radiculopathy, lumbar region: Secondary | ICD-10-CM | POA: Diagnosis not present

## 2018-01-05 DIAGNOSIS — E1142 Type 2 diabetes mellitus with diabetic polyneuropathy: Secondary | ICD-10-CM | POA: Diagnosis not present

## 2018-01-05 DIAGNOSIS — R339 Retention of urine, unspecified: Secondary | ICD-10-CM | POA: Diagnosis not present

## 2018-01-09 DIAGNOSIS — I10 Essential (primary) hypertension: Secondary | ICD-10-CM | POA: Diagnosis not present

## 2018-01-09 DIAGNOSIS — E1142 Type 2 diabetes mellitus with diabetic polyneuropathy: Secondary | ICD-10-CM | POA: Diagnosis not present

## 2018-01-09 DIAGNOSIS — R600 Localized edema: Secondary | ICD-10-CM | POA: Diagnosis not present

## 2018-01-09 DIAGNOSIS — Z794 Long term (current) use of insulin: Secondary | ICD-10-CM | POA: Diagnosis not present

## 2018-01-09 DIAGNOSIS — R339 Retention of urine, unspecified: Secondary | ICD-10-CM | POA: Diagnosis not present

## 2018-01-09 DIAGNOSIS — L89312 Pressure ulcer of right buttock, stage 2: Secondary | ICD-10-CM | POA: Diagnosis not present

## 2018-01-09 DIAGNOSIS — Z7982 Long term (current) use of aspirin: Secondary | ICD-10-CM | POA: Diagnosis not present

## 2018-01-09 DIAGNOSIS — Z79891 Long term (current) use of opiate analgesic: Secondary | ICD-10-CM | POA: Diagnosis not present

## 2018-01-09 DIAGNOSIS — Z8631 Personal history of diabetic foot ulcer: Secondary | ICD-10-CM | POA: Diagnosis not present

## 2018-01-09 DIAGNOSIS — M5416 Radiculopathy, lumbar region: Secondary | ICD-10-CM | POA: Diagnosis not present

## 2018-01-09 DIAGNOSIS — Z4800 Encounter for change or removal of nonsurgical wound dressing: Secondary | ICD-10-CM | POA: Diagnosis not present

## 2018-01-09 DIAGNOSIS — Z466 Encounter for fitting and adjustment of urinary device: Secondary | ICD-10-CM | POA: Diagnosis not present

## 2018-01-11 DIAGNOSIS — M5416 Radiculopathy, lumbar region: Secondary | ICD-10-CM | POA: Diagnosis not present

## 2018-01-11 DIAGNOSIS — Z8631 Personal history of diabetic foot ulcer: Secondary | ICD-10-CM | POA: Diagnosis not present

## 2018-01-11 DIAGNOSIS — R339 Retention of urine, unspecified: Secondary | ICD-10-CM | POA: Diagnosis not present

## 2018-01-11 DIAGNOSIS — Z466 Encounter for fitting and adjustment of urinary device: Secondary | ICD-10-CM | POA: Diagnosis not present

## 2018-01-11 DIAGNOSIS — E1142 Type 2 diabetes mellitus with diabetic polyneuropathy: Secondary | ICD-10-CM | POA: Diagnosis not present

## 2018-01-11 DIAGNOSIS — Z79891 Long term (current) use of opiate analgesic: Secondary | ICD-10-CM | POA: Diagnosis not present

## 2018-01-11 DIAGNOSIS — R600 Localized edema: Secondary | ICD-10-CM | POA: Diagnosis not present

## 2018-01-11 DIAGNOSIS — Z4800 Encounter for change or removal of nonsurgical wound dressing: Secondary | ICD-10-CM | POA: Diagnosis not present

## 2018-01-11 DIAGNOSIS — I10 Essential (primary) hypertension: Secondary | ICD-10-CM | POA: Diagnosis not present

## 2018-01-11 DIAGNOSIS — L89312 Pressure ulcer of right buttock, stage 2: Secondary | ICD-10-CM | POA: Diagnosis not present

## 2018-01-11 DIAGNOSIS — Z794 Long term (current) use of insulin: Secondary | ICD-10-CM | POA: Diagnosis not present

## 2018-01-11 DIAGNOSIS — Z7982 Long term (current) use of aspirin: Secondary | ICD-10-CM | POA: Diagnosis not present

## 2018-01-13 ENCOUNTER — Other Ambulatory Visit (HOSPITAL_COMMUNITY)
Admission: RE | Admit: 2018-01-13 | Discharge: 2018-01-13 | Disposition: A | Discharge status: Home / Self Care | Payer: Medicare Other | Source: Other Acute Inpatient Hospital | Attending: Cardiology | Admitting: Cardiology

## 2018-01-13 DIAGNOSIS — R600 Localized edema: Secondary | ICD-10-CM | POA: Diagnosis not present

## 2018-01-13 DIAGNOSIS — Z79891 Long term (current) use of opiate analgesic: Secondary | ICD-10-CM | POA: Diagnosis not present

## 2018-01-13 DIAGNOSIS — Z8631 Personal history of diabetic foot ulcer: Secondary | ICD-10-CM | POA: Diagnosis not present

## 2018-01-13 DIAGNOSIS — R339 Retention of urine, unspecified: Secondary | ICD-10-CM | POA: Diagnosis not present

## 2018-01-13 DIAGNOSIS — E1142 Type 2 diabetes mellitus with diabetic polyneuropathy: Secondary | ICD-10-CM | POA: Diagnosis not present

## 2018-01-13 DIAGNOSIS — G629 Polyneuropathy, unspecified: Secondary | ICD-10-CM | POA: Diagnosis not present

## 2018-01-13 DIAGNOSIS — I10 Essential (primary) hypertension: Secondary | ICD-10-CM | POA: Insufficient documentation

## 2018-01-13 DIAGNOSIS — M5416 Radiculopathy, lumbar region: Secondary | ICD-10-CM | POA: Diagnosis not present

## 2018-01-13 DIAGNOSIS — Z4800 Encounter for change or removal of nonsurgical wound dressing: Secondary | ICD-10-CM | POA: Diagnosis not present

## 2018-01-13 DIAGNOSIS — L89312 Pressure ulcer of right buttock, stage 2: Secondary | ICD-10-CM | POA: Diagnosis not present

## 2018-01-13 DIAGNOSIS — Z794 Long term (current) use of insulin: Secondary | ICD-10-CM | POA: Diagnosis not present

## 2018-01-13 DIAGNOSIS — Z7982 Long term (current) use of aspirin: Secondary | ICD-10-CM | POA: Diagnosis not present

## 2018-01-13 DIAGNOSIS — Z466 Encounter for fitting and adjustment of urinary device: Secondary | ICD-10-CM | POA: Diagnosis not present

## 2018-01-13 LAB — BASIC METABOLIC PANEL
ANION GAP: 9 (ref 5–15)
BUN: 31 mg/dL — ABNORMAL HIGH (ref 8–23)
CHLORIDE: 103 mmol/L (ref 98–111)
CO2: 30 mmol/L (ref 22–32)
Calcium: 9.3 mg/dL (ref 8.9–10.3)
Creatinine, Ser: 0.96 mg/dL (ref 0.44–1.00)
GFR calc non Af Amer: 58 mL/min — ABNORMAL LOW (ref >60)
Glucose, Bld: 63 mg/dL — ABNORMAL LOW (ref 70–99)
Potassium: 3.6 mmol/L (ref 3.5–5.1)
Sodium: 142 mmol/L (ref 135–145)

## 2018-01-13 LAB — MAGNESIUM: Magnesium: 2 mg/dL (ref 1.7–2.4)

## 2018-01-16 ENCOUNTER — Ambulatory Visit (INDEPENDENT_AMBULATORY_CARE_PROVIDER_SITE_OTHER): Payer: Medicare Other | Admitting: *Deleted

## 2018-01-16 DIAGNOSIS — M5416 Radiculopathy, lumbar region: Secondary | ICD-10-CM | POA: Diagnosis not present

## 2018-01-16 DIAGNOSIS — E1142 Type 2 diabetes mellitus with diabetic polyneuropathy: Secondary | ICD-10-CM | POA: Diagnosis not present

## 2018-01-16 DIAGNOSIS — Z4800 Encounter for change or removal of nonsurgical wound dressing: Secondary | ICD-10-CM | POA: Diagnosis not present

## 2018-01-16 DIAGNOSIS — I10 Essential (primary) hypertension: Secondary | ICD-10-CM | POA: Diagnosis not present

## 2018-01-16 DIAGNOSIS — R002 Palpitations: Secondary | ICD-10-CM

## 2018-01-16 DIAGNOSIS — Z7982 Long term (current) use of aspirin: Secondary | ICD-10-CM | POA: Diagnosis not present

## 2018-01-16 DIAGNOSIS — Z8631 Personal history of diabetic foot ulcer: Secondary | ICD-10-CM | POA: Diagnosis not present

## 2018-01-16 DIAGNOSIS — Z79891 Long term (current) use of opiate analgesic: Secondary | ICD-10-CM | POA: Diagnosis not present

## 2018-01-16 DIAGNOSIS — L89312 Pressure ulcer of right buttock, stage 2: Secondary | ICD-10-CM | POA: Diagnosis not present

## 2018-01-16 DIAGNOSIS — R339 Retention of urine, unspecified: Secondary | ICD-10-CM | POA: Diagnosis not present

## 2018-01-16 DIAGNOSIS — Z466 Encounter for fitting and adjustment of urinary device: Secondary | ICD-10-CM | POA: Diagnosis not present

## 2018-01-16 DIAGNOSIS — Z794 Long term (current) use of insulin: Secondary | ICD-10-CM | POA: Diagnosis not present

## 2018-01-16 DIAGNOSIS — R600 Localized edema: Secondary | ICD-10-CM | POA: Diagnosis not present

## 2018-01-16 NOTE — Progress Notes (Signed)
Pt here for vitals EKG per 9/30 OV - pt denies any new symptoms at this time - weight is 295lbs today - BP 130/64 EKG done and scanned into chart - pt had labs done and are in Epic

## 2018-01-17 DIAGNOSIS — R339 Retention of urine, unspecified: Secondary | ICD-10-CM | POA: Diagnosis not present

## 2018-01-17 DIAGNOSIS — Z79891 Long term (current) use of opiate analgesic: Secondary | ICD-10-CM | POA: Diagnosis not present

## 2018-01-17 DIAGNOSIS — Z794 Long term (current) use of insulin: Secondary | ICD-10-CM | POA: Diagnosis not present

## 2018-01-17 DIAGNOSIS — Z466 Encounter for fitting and adjustment of urinary device: Secondary | ICD-10-CM | POA: Diagnosis not present

## 2018-01-17 DIAGNOSIS — Z7982 Long term (current) use of aspirin: Secondary | ICD-10-CM | POA: Diagnosis not present

## 2018-01-17 DIAGNOSIS — M5416 Radiculopathy, lumbar region: Secondary | ICD-10-CM | POA: Diagnosis not present

## 2018-01-17 DIAGNOSIS — Z8631 Personal history of diabetic foot ulcer: Secondary | ICD-10-CM | POA: Diagnosis not present

## 2018-01-17 DIAGNOSIS — R600 Localized edema: Secondary | ICD-10-CM | POA: Diagnosis not present

## 2018-01-17 DIAGNOSIS — L89312 Pressure ulcer of right buttock, stage 2: Secondary | ICD-10-CM | POA: Diagnosis not present

## 2018-01-17 DIAGNOSIS — Z4800 Encounter for change or removal of nonsurgical wound dressing: Secondary | ICD-10-CM | POA: Diagnosis not present

## 2018-01-17 DIAGNOSIS — E1142 Type 2 diabetes mellitus with diabetic polyneuropathy: Secondary | ICD-10-CM | POA: Diagnosis not present

## 2018-01-17 DIAGNOSIS — I10 Essential (primary) hypertension: Secondary | ICD-10-CM | POA: Diagnosis not present

## 2018-01-17 NOTE — Progress Notes (Signed)
Labs resulted already. Weights up slightly from last visit. Let her know ok to take toresmide 40mg  bid when weights or edema is increasing, otherwise would take 40mg  in AM and 20mg  in PM   Zandra Abts MD

## 2018-01-17 NOTE — Progress Notes (Signed)
Pt made aware of lab results and voiced understanding on torsemide instructions

## 2018-01-18 DIAGNOSIS — L89312 Pressure ulcer of right buttock, stage 2: Secondary | ICD-10-CM | POA: Diagnosis not present

## 2018-01-18 DIAGNOSIS — Z8631 Personal history of diabetic foot ulcer: Secondary | ICD-10-CM | POA: Diagnosis not present

## 2018-01-18 DIAGNOSIS — E1142 Type 2 diabetes mellitus with diabetic polyneuropathy: Secondary | ICD-10-CM | POA: Diagnosis not present

## 2018-01-18 DIAGNOSIS — Z4800 Encounter for change or removal of nonsurgical wound dressing: Secondary | ICD-10-CM | POA: Diagnosis not present

## 2018-01-18 DIAGNOSIS — Z7982 Long term (current) use of aspirin: Secondary | ICD-10-CM | POA: Diagnosis not present

## 2018-01-18 DIAGNOSIS — I10 Essential (primary) hypertension: Secondary | ICD-10-CM | POA: Diagnosis not present

## 2018-01-18 DIAGNOSIS — Z79891 Long term (current) use of opiate analgesic: Secondary | ICD-10-CM | POA: Diagnosis not present

## 2018-01-18 DIAGNOSIS — Z794 Long term (current) use of insulin: Secondary | ICD-10-CM | POA: Diagnosis not present

## 2018-01-18 DIAGNOSIS — Z466 Encounter for fitting and adjustment of urinary device: Secondary | ICD-10-CM | POA: Diagnosis not present

## 2018-01-18 DIAGNOSIS — R600 Localized edema: Secondary | ICD-10-CM | POA: Diagnosis not present

## 2018-01-18 DIAGNOSIS — R339 Retention of urine, unspecified: Secondary | ICD-10-CM | POA: Diagnosis not present

## 2018-01-18 DIAGNOSIS — M5416 Radiculopathy, lumbar region: Secondary | ICD-10-CM | POA: Diagnosis not present

## 2018-01-20 DIAGNOSIS — Z79891 Long term (current) use of opiate analgesic: Secondary | ICD-10-CM | POA: Diagnosis not present

## 2018-01-20 DIAGNOSIS — I10 Essential (primary) hypertension: Secondary | ICD-10-CM | POA: Diagnosis not present

## 2018-01-20 DIAGNOSIS — Z7982 Long term (current) use of aspirin: Secondary | ICD-10-CM | POA: Diagnosis not present

## 2018-01-20 DIAGNOSIS — E1142 Type 2 diabetes mellitus with diabetic polyneuropathy: Secondary | ICD-10-CM | POA: Diagnosis not present

## 2018-01-20 DIAGNOSIS — M5416 Radiculopathy, lumbar region: Secondary | ICD-10-CM | POA: Diagnosis not present

## 2018-01-20 DIAGNOSIS — Z466 Encounter for fitting and adjustment of urinary device: Secondary | ICD-10-CM | POA: Diagnosis not present

## 2018-01-20 DIAGNOSIS — R600 Localized edema: Secondary | ICD-10-CM | POA: Diagnosis not present

## 2018-01-20 DIAGNOSIS — G894 Chronic pain syndrome: Secondary | ICD-10-CM | POA: Diagnosis not present

## 2018-01-20 DIAGNOSIS — Z4800 Encounter for change or removal of nonsurgical wound dressing: Secondary | ICD-10-CM | POA: Diagnosis not present

## 2018-01-20 DIAGNOSIS — M169 Osteoarthritis of hip, unspecified: Secondary | ICD-10-CM | POA: Diagnosis not present

## 2018-01-20 DIAGNOSIS — Z8631 Personal history of diabetic foot ulcer: Secondary | ICD-10-CM | POA: Diagnosis not present

## 2018-01-20 DIAGNOSIS — M47816 Spondylosis without myelopathy or radiculopathy, lumbar region: Secondary | ICD-10-CM | POA: Diagnosis not present

## 2018-01-20 DIAGNOSIS — Z794 Long term (current) use of insulin: Secondary | ICD-10-CM | POA: Diagnosis not present

## 2018-01-20 DIAGNOSIS — R339 Retention of urine, unspecified: Secondary | ICD-10-CM | POA: Diagnosis not present

## 2018-01-20 DIAGNOSIS — L89312 Pressure ulcer of right buttock, stage 2: Secondary | ICD-10-CM | POA: Diagnosis not present

## 2018-01-24 ENCOUNTER — Telehealth: Payer: Self-pay | Admitting: Cardiology

## 2018-01-24 DIAGNOSIS — R252 Cramp and spasm: Secondary | ICD-10-CM

## 2018-01-24 DIAGNOSIS — M5416 Radiculopathy, lumbar region: Secondary | ICD-10-CM | POA: Diagnosis not present

## 2018-01-24 DIAGNOSIS — R339 Retention of urine, unspecified: Secondary | ICD-10-CM | POA: Diagnosis not present

## 2018-01-24 DIAGNOSIS — I10 Essential (primary) hypertension: Secondary | ICD-10-CM | POA: Diagnosis not present

## 2018-01-24 DIAGNOSIS — Z4800 Encounter for change or removal of nonsurgical wound dressing: Secondary | ICD-10-CM | POA: Diagnosis not present

## 2018-01-24 DIAGNOSIS — Z8631 Personal history of diabetic foot ulcer: Secondary | ICD-10-CM | POA: Diagnosis not present

## 2018-01-24 DIAGNOSIS — Z79891 Long term (current) use of opiate analgesic: Secondary | ICD-10-CM | POA: Diagnosis not present

## 2018-01-24 DIAGNOSIS — E1142 Type 2 diabetes mellitus with diabetic polyneuropathy: Secondary | ICD-10-CM | POA: Diagnosis not present

## 2018-01-24 DIAGNOSIS — L89312 Pressure ulcer of right buttock, stage 2: Secondary | ICD-10-CM | POA: Diagnosis not present

## 2018-01-24 DIAGNOSIS — Z794 Long term (current) use of insulin: Secondary | ICD-10-CM | POA: Diagnosis not present

## 2018-01-24 DIAGNOSIS — R600 Localized edema: Secondary | ICD-10-CM | POA: Diagnosis not present

## 2018-01-24 DIAGNOSIS — Z7982 Long term (current) use of aspirin: Secondary | ICD-10-CM | POA: Diagnosis not present

## 2018-01-24 DIAGNOSIS — Z466 Encounter for fitting and adjustment of urinary device: Secondary | ICD-10-CM | POA: Diagnosis not present

## 2018-01-24 MED ORDER — POTASSIUM CHLORIDE CRYS ER 20 MEQ PO TBCR: 20.0000 meq | EXTENDED_RELEASE_TABLET | Freq: Two times a day (BID) | ORAL | 1 refills | Status: AC

## 2018-01-24 NOTE — Telephone Encounter (Signed)
Pt voiced understanding - potassium sent to [facility] - spoke with Elmyra Ricks from home health and gave verbal order for BMP says she will draw labs next Monday or Tuesday when she sees the pt again

## 2018-01-24 NOTE — Telephone Encounter (Signed)
Pt says since taking torsemide she has increased cramping in hands and feet - currently taking torsemide 20 mg 2 tablets in the morning and 1 tablet in the evening - not taking potassium - says her feet and ankles still remain swollen and doesn't know if she has had change in weight since she doesn't have a scale at home - wanted to know if she should continue with torsemide - denies any SOB/dizziness - doesn't know what HR/BP is running - routed to covering provider Dr Harl Bowie out of office

## 2018-01-24 NOTE — Telephone Encounter (Signed)
I reviewed Dr. Nelly Laurence notes.  Start potassium chloride 20 mEq twice daily.  Check a basic metabolic panel.  For now continue current dose of torsemide.  Dr. Harl Bowie will reassess once he returns.

## 2018-01-24 NOTE — Telephone Encounter (Signed)
Patient complaining cramping in hands and feet. Side effect of Torsemide. Please advise. / tg

## 2018-01-26 DIAGNOSIS — L89312 Pressure ulcer of right buttock, stage 2: Secondary | ICD-10-CM | POA: Diagnosis not present

## 2018-01-26 DIAGNOSIS — Z8631 Personal history of diabetic foot ulcer: Secondary | ICD-10-CM | POA: Diagnosis not present

## 2018-01-26 DIAGNOSIS — Z4800 Encounter for change or removal of nonsurgical wound dressing: Secondary | ICD-10-CM | POA: Diagnosis not present

## 2018-01-26 DIAGNOSIS — Z794 Long term (current) use of insulin: Secondary | ICD-10-CM | POA: Diagnosis not present

## 2018-01-26 DIAGNOSIS — M5416 Radiculopathy, lumbar region: Secondary | ICD-10-CM | POA: Diagnosis not present

## 2018-01-26 DIAGNOSIS — N390 Urinary tract infection, site not specified: Secondary | ICD-10-CM | POA: Diagnosis not present

## 2018-01-26 DIAGNOSIS — E1142 Type 2 diabetes mellitus with diabetic polyneuropathy: Secondary | ICD-10-CM | POA: Diagnosis not present

## 2018-01-26 DIAGNOSIS — R339 Retention of urine, unspecified: Secondary | ICD-10-CM | POA: Diagnosis not present

## 2018-01-26 DIAGNOSIS — I10 Essential (primary) hypertension: Secondary | ICD-10-CM | POA: Diagnosis not present

## 2018-01-26 DIAGNOSIS — Z7982 Long term (current) use of aspirin: Secondary | ICD-10-CM | POA: Diagnosis not present

## 2018-01-26 DIAGNOSIS — Z466 Encounter for fitting and adjustment of urinary device: Secondary | ICD-10-CM | POA: Diagnosis not present

## 2018-01-26 DIAGNOSIS — R600 Localized edema: Secondary | ICD-10-CM | POA: Diagnosis not present

## 2018-01-26 DIAGNOSIS — Z79891 Long term (current) use of opiate analgesic: Secondary | ICD-10-CM | POA: Diagnosis not present

## 2018-01-27 DIAGNOSIS — R600 Localized edema: Secondary | ICD-10-CM | POA: Diagnosis not present

## 2018-01-27 DIAGNOSIS — L89312 Pressure ulcer of right buttock, stage 2: Secondary | ICD-10-CM | POA: Diagnosis not present

## 2018-01-27 DIAGNOSIS — R339 Retention of urine, unspecified: Secondary | ICD-10-CM | POA: Diagnosis not present

## 2018-01-27 DIAGNOSIS — Z794 Long term (current) use of insulin: Secondary | ICD-10-CM | POA: Diagnosis not present

## 2018-01-27 DIAGNOSIS — Z466 Encounter for fitting and adjustment of urinary device: Secondary | ICD-10-CM | POA: Diagnosis not present

## 2018-01-27 DIAGNOSIS — Z79891 Long term (current) use of opiate analgesic: Secondary | ICD-10-CM | POA: Diagnosis not present

## 2018-01-27 DIAGNOSIS — M5416 Radiculopathy, lumbar region: Secondary | ICD-10-CM | POA: Diagnosis not present

## 2018-01-27 DIAGNOSIS — Z8631 Personal history of diabetic foot ulcer: Secondary | ICD-10-CM | POA: Diagnosis not present

## 2018-01-27 DIAGNOSIS — E1142 Type 2 diabetes mellitus with diabetic polyneuropathy: Secondary | ICD-10-CM | POA: Diagnosis not present

## 2018-01-27 DIAGNOSIS — I10 Essential (primary) hypertension: Secondary | ICD-10-CM | POA: Diagnosis not present

## 2018-01-27 DIAGNOSIS — Z4800 Encounter for change or removal of nonsurgical wound dressing: Secondary | ICD-10-CM | POA: Diagnosis not present

## 2018-01-27 DIAGNOSIS — Z7982 Long term (current) use of aspirin: Secondary | ICD-10-CM | POA: Diagnosis not present

## 2018-01-30 DIAGNOSIS — Z7982 Long term (current) use of aspirin: Secondary | ICD-10-CM | POA: Diagnosis not present

## 2018-01-30 DIAGNOSIS — R339 Retention of urine, unspecified: Secondary | ICD-10-CM | POA: Diagnosis not present

## 2018-01-30 DIAGNOSIS — L89312 Pressure ulcer of right buttock, stage 2: Secondary | ICD-10-CM | POA: Diagnosis not present

## 2018-01-30 DIAGNOSIS — Z794 Long term (current) use of insulin: Secondary | ICD-10-CM | POA: Diagnosis not present

## 2018-01-30 DIAGNOSIS — I10 Essential (primary) hypertension: Secondary | ICD-10-CM | POA: Diagnosis not present

## 2018-01-30 DIAGNOSIS — Z79891 Long term (current) use of opiate analgesic: Secondary | ICD-10-CM | POA: Diagnosis not present

## 2018-01-30 DIAGNOSIS — Z8631 Personal history of diabetic foot ulcer: Secondary | ICD-10-CM | POA: Diagnosis not present

## 2018-01-30 DIAGNOSIS — E1142 Type 2 diabetes mellitus with diabetic polyneuropathy: Secondary | ICD-10-CM | POA: Diagnosis not present

## 2018-01-30 DIAGNOSIS — Z4800 Encounter for change or removal of nonsurgical wound dressing: Secondary | ICD-10-CM | POA: Diagnosis not present

## 2018-01-30 DIAGNOSIS — Z466 Encounter for fitting and adjustment of urinary device: Secondary | ICD-10-CM | POA: Diagnosis not present

## 2018-01-30 DIAGNOSIS — R600 Localized edema: Secondary | ICD-10-CM | POA: Diagnosis not present

## 2018-01-30 DIAGNOSIS — M5416 Radiculopathy, lumbar region: Secondary | ICD-10-CM | POA: Diagnosis not present

## 2018-02-01 DIAGNOSIS — B964 Proteus (mirabilis) (morganii) as the cause of diseases classified elsewhere: Secondary | ICD-10-CM | POA: Diagnosis not present

## 2018-02-01 DIAGNOSIS — E875 Hyperkalemia: Secondary | ICD-10-CM | POA: Diagnosis not present

## 2018-02-01 DIAGNOSIS — E1140 Type 2 diabetes mellitus with diabetic neuropathy, unspecified: Secondary | ICD-10-CM | POA: Diagnosis not present

## 2018-02-01 DIAGNOSIS — T83511A Infection and inflammatory reaction due to indwelling urethral catheter, initial encounter: Secondary | ICD-10-CM | POA: Diagnosis not present

## 2018-02-01 DIAGNOSIS — R531 Weakness: Secondary | ICD-10-CM | POA: Diagnosis not present

## 2018-02-01 DIAGNOSIS — N3001 Acute cystitis with hematuria: Secondary | ICD-10-CM | POA: Diagnosis not present

## 2018-02-01 DIAGNOSIS — R410 Disorientation, unspecified: Secondary | ICD-10-CM | POA: Diagnosis not present

## 2018-02-01 DIAGNOSIS — N179 Acute kidney failure, unspecified: Secondary | ICD-10-CM | POA: Diagnosis not present

## 2018-02-01 DIAGNOSIS — N319 Neuromuscular dysfunction of bladder, unspecified: Secondary | ICD-10-CM | POA: Diagnosis not present

## 2018-02-01 DIAGNOSIS — I959 Hypotension, unspecified: Secondary | ICD-10-CM | POA: Diagnosis not present

## 2018-02-01 DIAGNOSIS — S299XXA Unspecified injury of thorax, initial encounter: Secondary | ICD-10-CM | POA: Diagnosis not present

## 2018-02-01 DIAGNOSIS — Z87891 Personal history of nicotine dependence: Secondary | ICD-10-CM | POA: Diagnosis not present

## 2018-02-01 DIAGNOSIS — W1830XA Fall on same level, unspecified, initial encounter: Secondary | ICD-10-CM | POA: Diagnosis not present

## 2018-02-01 DIAGNOSIS — R338 Other retention of urine: Secondary | ICD-10-CM | POA: Diagnosis not present

## 2018-02-01 DIAGNOSIS — E119 Type 2 diabetes mellitus without complications: Secondary | ICD-10-CM | POA: Diagnosis not present

## 2018-02-01 DIAGNOSIS — Z466 Encounter for fitting and adjustment of urinary device: Secondary | ICD-10-CM | POA: Diagnosis not present

## 2018-02-01 DIAGNOSIS — I10 Essential (primary) hypertension: Secondary | ICD-10-CM | POA: Diagnosis not present

## 2018-02-01 DIAGNOSIS — R609 Edema, unspecified: Secondary | ICD-10-CM | POA: Diagnosis not present

## 2018-02-01 DIAGNOSIS — L89312 Pressure ulcer of right buttock, stage 2: Secondary | ICD-10-CM | POA: Diagnosis not present

## 2018-02-01 DIAGNOSIS — Z8673 Personal history of transient ischemic attack (TIA), and cerebral infarction without residual deficits: Secondary | ICD-10-CM | POA: Diagnosis not present

## 2018-02-01 DIAGNOSIS — E871 Hypo-osmolality and hyponatremia: Secondary | ICD-10-CM | POA: Diagnosis not present

## 2018-02-01 DIAGNOSIS — R600 Localized edema: Secondary | ICD-10-CM | POA: Diagnosis not present

## 2018-02-01 DIAGNOSIS — L89890 Pressure ulcer of other site, unstageable: Secondary | ICD-10-CM | POA: Diagnosis not present

## 2018-02-01 DIAGNOSIS — S0990XA Unspecified injury of head, initial encounter: Secondary | ICD-10-CM | POA: Diagnosis not present

## 2018-02-01 DIAGNOSIS — E860 Dehydration: Secondary | ICD-10-CM | POA: Diagnosis not present

## 2018-02-01 DIAGNOSIS — E1142 Type 2 diabetes mellitus with diabetic polyneuropathy: Secondary | ICD-10-CM | POA: Diagnosis not present

## 2018-02-01 DIAGNOSIS — Z7982 Long term (current) use of aspirin: Secondary | ICD-10-CM | POA: Diagnosis not present

## 2018-02-01 DIAGNOSIS — Z9181 History of falling: Secondary | ICD-10-CM | POA: Diagnosis not present

## 2018-02-01 DIAGNOSIS — N390 Urinary tract infection, site not specified: Secondary | ICD-10-CM | POA: Diagnosis not present

## 2018-02-01 DIAGNOSIS — Z794 Long term (current) use of insulin: Secondary | ICD-10-CM | POA: Diagnosis not present

## 2018-02-01 DIAGNOSIS — R339 Retention of urine, unspecified: Secondary | ICD-10-CM | POA: Diagnosis not present

## 2018-02-01 DIAGNOSIS — M6281 Muscle weakness (generalized): Secondary | ICD-10-CM | POA: Diagnosis not present

## 2018-02-01 DIAGNOSIS — Z87440 Personal history of urinary (tract) infections: Secondary | ICD-10-CM | POA: Diagnosis not present

## 2018-02-01 DIAGNOSIS — W19XXXA Unspecified fall, initial encounter: Secondary | ICD-10-CM | POA: Diagnosis not present

## 2018-02-01 DIAGNOSIS — Z960 Presence of urogenital implants: Secondary | ICD-10-CM | POA: Diagnosis not present

## 2018-02-03 DIAGNOSIS — R600 Localized edema: Secondary | ICD-10-CM | POA: Diagnosis not present

## 2018-02-03 DIAGNOSIS — L89312 Pressure ulcer of right buttock, stage 2: Secondary | ICD-10-CM | POA: Diagnosis not present

## 2018-02-03 DIAGNOSIS — R339 Retention of urine, unspecified: Secondary | ICD-10-CM | POA: Diagnosis not present

## 2018-02-03 DIAGNOSIS — E1142 Type 2 diabetes mellitus with diabetic polyneuropathy: Secondary | ICD-10-CM | POA: Diagnosis not present

## 2018-02-03 DIAGNOSIS — Z466 Encounter for fitting and adjustment of urinary device: Secondary | ICD-10-CM | POA: Diagnosis not present

## 2018-02-06 DIAGNOSIS — L89890 Pressure ulcer of other site, unstageable: Secondary | ICD-10-CM | POA: Diagnosis not present

## 2018-02-06 DIAGNOSIS — Z9181 History of falling: Secondary | ICD-10-CM | POA: Diagnosis not present

## 2018-02-06 DIAGNOSIS — Z960 Presence of urogenital implants: Secondary | ICD-10-CM | POA: Diagnosis not present

## 2018-02-06 DIAGNOSIS — H43813 Vitreous degeneration, bilateral: Secondary | ICD-10-CM | POA: Diagnosis not present

## 2018-02-06 DIAGNOSIS — E1140 Type 2 diabetes mellitus with diabetic neuropathy, unspecified: Secondary | ICD-10-CM | POA: Diagnosis not present

## 2018-02-06 DIAGNOSIS — I10 Essential (primary) hypertension: Secondary | ICD-10-CM | POA: Diagnosis not present

## 2018-02-06 DIAGNOSIS — R338 Other retention of urine: Secondary | ICD-10-CM | POA: Diagnosis not present

## 2018-02-06 DIAGNOSIS — R609 Edema, unspecified: Secondary | ICD-10-CM | POA: Diagnosis not present

## 2018-02-06 DIAGNOSIS — H35033 Hypertensive retinopathy, bilateral: Secondary | ICD-10-CM | POA: Diagnosis not present

## 2018-02-06 DIAGNOSIS — E113593 Type 2 diabetes mellitus with proliferative diabetic retinopathy without macular edema, bilateral: Secondary | ICD-10-CM | POA: Diagnosis not present

## 2018-02-06 DIAGNOSIS — L89312 Pressure ulcer of right buttock, stage 2: Secondary | ICD-10-CM | POA: Diagnosis not present

## 2018-02-06 DIAGNOSIS — N390 Urinary tract infection, site not specified: Secondary | ICD-10-CM | POA: Diagnosis not present

## 2018-02-06 DIAGNOSIS — N179 Acute kidney failure, unspecified: Secondary | ICD-10-CM | POA: Diagnosis not present

## 2018-02-06 DIAGNOSIS — E11319 Type 2 diabetes mellitus with unspecified diabetic retinopathy without macular edema: Secondary | ICD-10-CM | POA: Diagnosis not present

## 2018-02-06 DIAGNOSIS — M6281 Muscle weakness (generalized): Secondary | ICD-10-CM | POA: Diagnosis not present

## 2018-02-06 DIAGNOSIS — B964 Proteus (mirabilis) (morganii) as the cause of diseases classified elsewhere: Secondary | ICD-10-CM | POA: Diagnosis not present

## 2018-02-06 DIAGNOSIS — N319 Neuromuscular dysfunction of bladder, unspecified: Secondary | ICD-10-CM | POA: Diagnosis not present

## 2018-02-20 ENCOUNTER — Encounter (INDEPENDENT_AMBULATORY_CARE_PROVIDER_SITE_OTHER): Payer: Medicare Other | Admitting: Ophthalmology

## 2018-02-20 DIAGNOSIS — I10 Essential (primary) hypertension: Secondary | ICD-10-CM | POA: Diagnosis not present

## 2018-02-20 DIAGNOSIS — H35033 Hypertensive retinopathy, bilateral: Secondary | ICD-10-CM | POA: Diagnosis not present

## 2018-02-20 DIAGNOSIS — H43813 Vitreous degeneration, bilateral: Secondary | ICD-10-CM | POA: Diagnosis not present

## 2018-02-20 DIAGNOSIS — E11319 Type 2 diabetes mellitus with unspecified diabetic retinopathy without macular edema: Secondary | ICD-10-CM | POA: Diagnosis not present

## 2018-02-20 DIAGNOSIS — E113593 Type 2 diabetes mellitus with proliferative diabetic retinopathy without macular edema, bilateral: Secondary | ICD-10-CM | POA: Diagnosis not present

## 2018-02-25 DIAGNOSIS — Z4800 Encounter for change or removal of nonsurgical wound dressing: Secondary | ICD-10-CM | POA: Diagnosis not present

## 2018-02-25 DIAGNOSIS — Z466 Encounter for fitting and adjustment of urinary device: Secondary | ICD-10-CM | POA: Diagnosis not present

## 2018-02-25 DIAGNOSIS — I10 Essential (primary) hypertension: Secondary | ICD-10-CM | POA: Diagnosis not present

## 2018-02-25 DIAGNOSIS — Z8631 Personal history of diabetic foot ulcer: Secondary | ICD-10-CM | POA: Diagnosis not present

## 2018-02-25 DIAGNOSIS — L89312 Pressure ulcer of right buttock, stage 2: Secondary | ICD-10-CM | POA: Diagnosis not present

## 2018-02-25 DIAGNOSIS — M5416 Radiculopathy, lumbar region: Secondary | ICD-10-CM | POA: Diagnosis not present

## 2018-02-25 DIAGNOSIS — Z794 Long term (current) use of insulin: Secondary | ICD-10-CM | POA: Diagnosis not present

## 2018-02-25 DIAGNOSIS — E1142 Type 2 diabetes mellitus with diabetic polyneuropathy: Secondary | ICD-10-CM | POA: Diagnosis not present

## 2018-02-25 DIAGNOSIS — R600 Localized edema: Secondary | ICD-10-CM | POA: Diagnosis not present

## 2018-02-25 DIAGNOSIS — R339 Retention of urine, unspecified: Secondary | ICD-10-CM | POA: Diagnosis not present

## 2018-02-25 DIAGNOSIS — Z79891 Long term (current) use of opiate analgesic: Secondary | ICD-10-CM | POA: Diagnosis not present

## 2018-02-25 DIAGNOSIS — Z7982 Long term (current) use of aspirin: Secondary | ICD-10-CM | POA: Diagnosis not present

## 2018-02-27 DIAGNOSIS — R339 Retention of urine, unspecified: Secondary | ICD-10-CM | POA: Diagnosis not present

## 2018-02-27 DIAGNOSIS — L89312 Pressure ulcer of right buttock, stage 2: Secondary | ICD-10-CM | POA: Diagnosis not present

## 2018-02-27 DIAGNOSIS — I10 Essential (primary) hypertension: Secondary | ICD-10-CM | POA: Diagnosis not present

## 2018-02-27 DIAGNOSIS — R600 Localized edema: Secondary | ICD-10-CM | POA: Diagnosis not present

## 2018-02-27 DIAGNOSIS — Z466 Encounter for fitting and adjustment of urinary device: Secondary | ICD-10-CM | POA: Diagnosis not present

## 2018-02-27 DIAGNOSIS — Z7982 Long term (current) use of aspirin: Secondary | ICD-10-CM | POA: Diagnosis not present

## 2018-02-27 DIAGNOSIS — M5416 Radiculopathy, lumbar region: Secondary | ICD-10-CM | POA: Diagnosis not present

## 2018-02-27 DIAGNOSIS — E1142 Type 2 diabetes mellitus with diabetic polyneuropathy: Secondary | ICD-10-CM | POA: Diagnosis not present

## 2018-02-27 DIAGNOSIS — Z4800 Encounter for change or removal of nonsurgical wound dressing: Secondary | ICD-10-CM | POA: Diagnosis not present

## 2018-02-27 DIAGNOSIS — Z8631 Personal history of diabetic foot ulcer: Secondary | ICD-10-CM | POA: Diagnosis not present

## 2018-02-27 DIAGNOSIS — Z79891 Long term (current) use of opiate analgesic: Secondary | ICD-10-CM | POA: Diagnosis not present

## 2018-02-27 DIAGNOSIS — Z794 Long term (current) use of insulin: Secondary | ICD-10-CM | POA: Diagnosis not present

## 2018-02-28 DIAGNOSIS — E1142 Type 2 diabetes mellitus with diabetic polyneuropathy: Secondary | ICD-10-CM | POA: Diagnosis not present

## 2018-02-28 DIAGNOSIS — R339 Retention of urine, unspecified: Secondary | ICD-10-CM | POA: Diagnosis not present

## 2018-02-28 DIAGNOSIS — M5416 Radiculopathy, lumbar region: Secondary | ICD-10-CM | POA: Diagnosis not present

## 2018-02-28 DIAGNOSIS — Z8631 Personal history of diabetic foot ulcer: Secondary | ICD-10-CM | POA: Diagnosis not present

## 2018-02-28 DIAGNOSIS — R600 Localized edema: Secondary | ICD-10-CM | POA: Diagnosis not present

## 2018-02-28 DIAGNOSIS — L89312 Pressure ulcer of right buttock, stage 2: Secondary | ICD-10-CM | POA: Diagnosis not present

## 2018-02-28 DIAGNOSIS — Z4800 Encounter for change or removal of nonsurgical wound dressing: Secondary | ICD-10-CM | POA: Diagnosis not present

## 2018-02-28 DIAGNOSIS — Z466 Encounter for fitting and adjustment of urinary device: Secondary | ICD-10-CM | POA: Diagnosis not present

## 2018-02-28 DIAGNOSIS — Z79891 Long term (current) use of opiate analgesic: Secondary | ICD-10-CM | POA: Diagnosis not present

## 2018-02-28 DIAGNOSIS — Z794 Long term (current) use of insulin: Secondary | ICD-10-CM | POA: Diagnosis not present

## 2018-02-28 DIAGNOSIS — Z7982 Long term (current) use of aspirin: Secondary | ICD-10-CM | POA: Diagnosis not present

## 2018-02-28 DIAGNOSIS — I10 Essential (primary) hypertension: Secondary | ICD-10-CM | POA: Diagnosis not present

## 2018-03-01 DIAGNOSIS — Z466 Encounter for fitting and adjustment of urinary device: Secondary | ICD-10-CM | POA: Diagnosis not present

## 2018-03-01 DIAGNOSIS — M5416 Radiculopathy, lumbar region: Secondary | ICD-10-CM | POA: Diagnosis not present

## 2018-03-01 DIAGNOSIS — R600 Localized edema: Secondary | ICD-10-CM | POA: Diagnosis not present

## 2018-03-01 DIAGNOSIS — Z7982 Long term (current) use of aspirin: Secondary | ICD-10-CM | POA: Diagnosis not present

## 2018-03-01 DIAGNOSIS — Z8631 Personal history of diabetic foot ulcer: Secondary | ICD-10-CM | POA: Diagnosis not present

## 2018-03-01 DIAGNOSIS — E1142 Type 2 diabetes mellitus with diabetic polyneuropathy: Secondary | ICD-10-CM | POA: Diagnosis not present

## 2018-03-01 DIAGNOSIS — I10 Essential (primary) hypertension: Secondary | ICD-10-CM | POA: Diagnosis not present

## 2018-03-01 DIAGNOSIS — R339 Retention of urine, unspecified: Secondary | ICD-10-CM | POA: Diagnosis not present

## 2018-03-01 DIAGNOSIS — Z794 Long term (current) use of insulin: Secondary | ICD-10-CM | POA: Diagnosis not present

## 2018-03-01 DIAGNOSIS — Z4800 Encounter for change or removal of nonsurgical wound dressing: Secondary | ICD-10-CM | POA: Diagnosis not present

## 2018-03-01 DIAGNOSIS — Z79891 Long term (current) use of opiate analgesic: Secondary | ICD-10-CM | POA: Diagnosis not present

## 2018-03-01 DIAGNOSIS — L89312 Pressure ulcer of right buttock, stage 2: Secondary | ICD-10-CM | POA: Diagnosis not present

## 2018-03-07 DIAGNOSIS — R600 Localized edema: Secondary | ICD-10-CM | POA: Diagnosis not present

## 2018-03-07 DIAGNOSIS — Z87440 Personal history of urinary (tract) infections: Secondary | ICD-10-CM | POA: Diagnosis not present

## 2018-03-07 DIAGNOSIS — Z7982 Long term (current) use of aspirin: Secondary | ICD-10-CM | POA: Diagnosis not present

## 2018-03-07 DIAGNOSIS — Z9181 History of falling: Secondary | ICD-10-CM | POA: Diagnosis not present

## 2018-03-07 DIAGNOSIS — Z8631 Personal history of diabetic foot ulcer: Secondary | ICD-10-CM | POA: Diagnosis not present

## 2018-03-07 DIAGNOSIS — M5416 Radiculopathy, lumbar region: Secondary | ICD-10-CM | POA: Diagnosis not present

## 2018-03-07 DIAGNOSIS — Z79891 Long term (current) use of opiate analgesic: Secondary | ICD-10-CM | POA: Diagnosis not present

## 2018-03-07 DIAGNOSIS — N319 Neuromuscular dysfunction of bladder, unspecified: Secondary | ICD-10-CM | POA: Diagnosis not present

## 2018-03-07 DIAGNOSIS — Z794 Long term (current) use of insulin: Secondary | ICD-10-CM | POA: Diagnosis not present

## 2018-03-07 DIAGNOSIS — Z4800 Encounter for change or removal of nonsurgical wound dressing: Secondary | ICD-10-CM | POA: Diagnosis not present

## 2018-03-07 DIAGNOSIS — R339 Retention of urine, unspecified: Secondary | ICD-10-CM | POA: Diagnosis not present

## 2018-03-07 DIAGNOSIS — Z466 Encounter for fitting and adjustment of urinary device: Secondary | ICD-10-CM | POA: Diagnosis not present

## 2018-03-07 DIAGNOSIS — I10 Essential (primary) hypertension: Secondary | ICD-10-CM | POA: Diagnosis not present

## 2018-03-07 DIAGNOSIS — E1142 Type 2 diabetes mellitus with diabetic polyneuropathy: Secondary | ICD-10-CM | POA: Diagnosis not present

## 2018-03-07 DIAGNOSIS — L89312 Pressure ulcer of right buttock, stage 2: Secondary | ICD-10-CM | POA: Diagnosis not present

## 2018-03-09 DIAGNOSIS — E1142 Type 2 diabetes mellitus with diabetic polyneuropathy: Secondary | ICD-10-CM | POA: Diagnosis not present

## 2018-03-09 DIAGNOSIS — R339 Retention of urine, unspecified: Secondary | ICD-10-CM | POA: Diagnosis not present

## 2018-03-09 DIAGNOSIS — Z7982 Long term (current) use of aspirin: Secondary | ICD-10-CM | POA: Diagnosis not present

## 2018-03-09 DIAGNOSIS — Z8631 Personal history of diabetic foot ulcer: Secondary | ICD-10-CM | POA: Diagnosis not present

## 2018-03-09 DIAGNOSIS — R600 Localized edema: Secondary | ICD-10-CM | POA: Diagnosis not present

## 2018-03-09 DIAGNOSIS — Z79891 Long term (current) use of opiate analgesic: Secondary | ICD-10-CM | POA: Diagnosis not present

## 2018-03-09 DIAGNOSIS — I10 Essential (primary) hypertension: Secondary | ICD-10-CM | POA: Diagnosis not present

## 2018-03-09 DIAGNOSIS — N319 Neuromuscular dysfunction of bladder, unspecified: Secondary | ICD-10-CM | POA: Diagnosis not present

## 2018-03-09 DIAGNOSIS — Z9181 History of falling: Secondary | ICD-10-CM | POA: Diagnosis not present

## 2018-03-09 DIAGNOSIS — M5416 Radiculopathy, lumbar region: Secondary | ICD-10-CM | POA: Diagnosis not present

## 2018-03-09 DIAGNOSIS — L89312 Pressure ulcer of right buttock, stage 2: Secondary | ICD-10-CM | POA: Diagnosis not present

## 2018-03-09 DIAGNOSIS — Z466 Encounter for fitting and adjustment of urinary device: Secondary | ICD-10-CM | POA: Diagnosis not present

## 2018-03-09 DIAGNOSIS — Z794 Long term (current) use of insulin: Secondary | ICD-10-CM | POA: Diagnosis not present

## 2018-03-09 DIAGNOSIS — Z4800 Encounter for change or removal of nonsurgical wound dressing: Secondary | ICD-10-CM | POA: Diagnosis not present

## 2018-03-09 DIAGNOSIS — Z87440 Personal history of urinary (tract) infections: Secondary | ICD-10-CM | POA: Diagnosis not present

## 2018-03-10 DIAGNOSIS — Z4800 Encounter for change or removal of nonsurgical wound dressing: Secondary | ICD-10-CM | POA: Diagnosis not present

## 2018-03-10 DIAGNOSIS — R600 Localized edema: Secondary | ICD-10-CM | POA: Diagnosis not present

## 2018-03-10 DIAGNOSIS — Z466 Encounter for fitting and adjustment of urinary device: Secondary | ICD-10-CM | POA: Diagnosis not present

## 2018-03-10 DIAGNOSIS — L89312 Pressure ulcer of right buttock, stage 2: Secondary | ICD-10-CM | POA: Diagnosis not present

## 2018-03-10 DIAGNOSIS — E1142 Type 2 diabetes mellitus with diabetic polyneuropathy: Secondary | ICD-10-CM | POA: Diagnosis not present

## 2018-03-10 DIAGNOSIS — R339 Retention of urine, unspecified: Secondary | ICD-10-CM | POA: Diagnosis not present

## 2018-03-10 DIAGNOSIS — Z87440 Personal history of urinary (tract) infections: Secondary | ICD-10-CM | POA: Diagnosis not present

## 2018-03-10 DIAGNOSIS — Z9181 History of falling: Secondary | ICD-10-CM | POA: Diagnosis not present

## 2018-03-10 DIAGNOSIS — M5416 Radiculopathy, lumbar region: Secondary | ICD-10-CM | POA: Diagnosis not present

## 2018-03-10 DIAGNOSIS — Z7982 Long term (current) use of aspirin: Secondary | ICD-10-CM | POA: Diagnosis not present

## 2018-03-10 DIAGNOSIS — N319 Neuromuscular dysfunction of bladder, unspecified: Secondary | ICD-10-CM | POA: Diagnosis not present

## 2018-03-10 DIAGNOSIS — Z79891 Long term (current) use of opiate analgesic: Secondary | ICD-10-CM | POA: Diagnosis not present

## 2018-03-10 DIAGNOSIS — Z8631 Personal history of diabetic foot ulcer: Secondary | ICD-10-CM | POA: Diagnosis not present

## 2018-03-10 DIAGNOSIS — I10 Essential (primary) hypertension: Secondary | ICD-10-CM | POA: Diagnosis not present

## 2018-03-10 DIAGNOSIS — Z794 Long term (current) use of insulin: Secondary | ICD-10-CM | POA: Diagnosis not present

## 2018-03-13 DIAGNOSIS — L89312 Pressure ulcer of right buttock, stage 2: Secondary | ICD-10-CM | POA: Diagnosis not present

## 2018-03-13 DIAGNOSIS — I10 Essential (primary) hypertension: Secondary | ICD-10-CM | POA: Diagnosis not present

## 2018-03-13 DIAGNOSIS — N319 Neuromuscular dysfunction of bladder, unspecified: Secondary | ICD-10-CM | POA: Diagnosis not present

## 2018-03-13 DIAGNOSIS — R339 Retention of urine, unspecified: Secondary | ICD-10-CM | POA: Diagnosis not present

## 2018-03-13 DIAGNOSIS — Z8631 Personal history of diabetic foot ulcer: Secondary | ICD-10-CM | POA: Diagnosis not present

## 2018-03-13 DIAGNOSIS — Z7982 Long term (current) use of aspirin: Secondary | ICD-10-CM | POA: Diagnosis not present

## 2018-03-13 DIAGNOSIS — M5416 Radiculopathy, lumbar region: Secondary | ICD-10-CM | POA: Diagnosis not present

## 2018-03-13 DIAGNOSIS — R600 Localized edema: Secondary | ICD-10-CM | POA: Diagnosis not present

## 2018-03-13 DIAGNOSIS — Z794 Long term (current) use of insulin: Secondary | ICD-10-CM | POA: Diagnosis not present

## 2018-03-13 DIAGNOSIS — E1142 Type 2 diabetes mellitus with diabetic polyneuropathy: Secondary | ICD-10-CM | POA: Diagnosis not present

## 2018-03-13 DIAGNOSIS — Z466 Encounter for fitting and adjustment of urinary device: Secondary | ICD-10-CM | POA: Diagnosis not present

## 2018-03-13 DIAGNOSIS — Z9181 History of falling: Secondary | ICD-10-CM | POA: Diagnosis not present

## 2018-03-13 DIAGNOSIS — Z87440 Personal history of urinary (tract) infections: Secondary | ICD-10-CM | POA: Diagnosis not present

## 2018-03-13 DIAGNOSIS — Z4800 Encounter for change or removal of nonsurgical wound dressing: Secondary | ICD-10-CM | POA: Diagnosis not present

## 2018-03-13 DIAGNOSIS — Z79891 Long term (current) use of opiate analgesic: Secondary | ICD-10-CM | POA: Diagnosis not present

## 2018-03-14 DIAGNOSIS — Z4800 Encounter for change or removal of nonsurgical wound dressing: Secondary | ICD-10-CM | POA: Diagnosis not present

## 2018-03-14 DIAGNOSIS — M47816 Spondylosis without myelopathy or radiculopathy, lumbar region: Secondary | ICD-10-CM | POA: Diagnosis not present

## 2018-03-14 DIAGNOSIS — Z87440 Personal history of urinary (tract) infections: Secondary | ICD-10-CM | POA: Diagnosis not present

## 2018-03-14 DIAGNOSIS — R339 Retention of urine, unspecified: Secondary | ICD-10-CM | POA: Diagnosis not present

## 2018-03-14 DIAGNOSIS — L89312 Pressure ulcer of right buttock, stage 2: Secondary | ICD-10-CM | POA: Diagnosis not present

## 2018-03-14 DIAGNOSIS — E1142 Type 2 diabetes mellitus with diabetic polyneuropathy: Secondary | ICD-10-CM | POA: Diagnosis not present

## 2018-03-14 DIAGNOSIS — M169 Osteoarthritis of hip, unspecified: Secondary | ICD-10-CM | POA: Diagnosis not present

## 2018-03-14 DIAGNOSIS — Z794 Long term (current) use of insulin: Secondary | ICD-10-CM | POA: Diagnosis not present

## 2018-03-14 DIAGNOSIS — G894 Chronic pain syndrome: Secondary | ICD-10-CM | POA: Diagnosis not present

## 2018-03-14 DIAGNOSIS — N319 Neuromuscular dysfunction of bladder, unspecified: Secondary | ICD-10-CM | POA: Diagnosis not present

## 2018-03-14 DIAGNOSIS — M5416 Radiculopathy, lumbar region: Secondary | ICD-10-CM | POA: Diagnosis not present

## 2018-03-14 DIAGNOSIS — Z8631 Personal history of diabetic foot ulcer: Secondary | ICD-10-CM | POA: Diagnosis not present

## 2018-03-14 DIAGNOSIS — R600 Localized edema: Secondary | ICD-10-CM | POA: Diagnosis not present

## 2018-03-14 DIAGNOSIS — Z9181 History of falling: Secondary | ICD-10-CM | POA: Diagnosis not present

## 2018-03-14 DIAGNOSIS — Z7982 Long term (current) use of aspirin: Secondary | ICD-10-CM | POA: Diagnosis not present

## 2018-03-14 DIAGNOSIS — Z466 Encounter for fitting and adjustment of urinary device: Secondary | ICD-10-CM | POA: Diagnosis not present

## 2018-03-14 DIAGNOSIS — Z79899 Other long term (current) drug therapy: Secondary | ICD-10-CM | POA: Diagnosis not present

## 2018-03-14 DIAGNOSIS — Z79891 Long term (current) use of opiate analgesic: Secondary | ICD-10-CM | POA: Diagnosis not present

## 2018-03-14 DIAGNOSIS — I10 Essential (primary) hypertension: Secondary | ICD-10-CM | POA: Diagnosis not present

## 2018-03-16 DIAGNOSIS — Z4800 Encounter for change or removal of nonsurgical wound dressing: Secondary | ICD-10-CM | POA: Diagnosis not present

## 2018-03-16 DIAGNOSIS — I10 Essential (primary) hypertension: Secondary | ICD-10-CM | POA: Diagnosis not present

## 2018-03-16 DIAGNOSIS — Z87440 Personal history of urinary (tract) infections: Secondary | ICD-10-CM | POA: Diagnosis not present

## 2018-03-16 DIAGNOSIS — R339 Retention of urine, unspecified: Secondary | ICD-10-CM | POA: Diagnosis not present

## 2018-03-16 DIAGNOSIS — M5416 Radiculopathy, lumbar region: Secondary | ICD-10-CM | POA: Diagnosis not present

## 2018-03-16 DIAGNOSIS — Z794 Long term (current) use of insulin: Secondary | ICD-10-CM | POA: Diagnosis not present

## 2018-03-16 DIAGNOSIS — Z7982 Long term (current) use of aspirin: Secondary | ICD-10-CM | POA: Diagnosis not present

## 2018-03-16 DIAGNOSIS — R600 Localized edema: Secondary | ICD-10-CM | POA: Diagnosis not present

## 2018-03-16 DIAGNOSIS — L89312 Pressure ulcer of right buttock, stage 2: Secondary | ICD-10-CM | POA: Diagnosis not present

## 2018-03-16 DIAGNOSIS — Z8631 Personal history of diabetic foot ulcer: Secondary | ICD-10-CM | POA: Diagnosis not present

## 2018-03-16 DIAGNOSIS — Z466 Encounter for fitting and adjustment of urinary device: Secondary | ICD-10-CM | POA: Diagnosis not present

## 2018-03-16 DIAGNOSIS — N319 Neuromuscular dysfunction of bladder, unspecified: Secondary | ICD-10-CM | POA: Diagnosis not present

## 2018-03-16 DIAGNOSIS — E1142 Type 2 diabetes mellitus with diabetic polyneuropathy: Secondary | ICD-10-CM | POA: Diagnosis not present

## 2018-03-16 DIAGNOSIS — Z9181 History of falling: Secondary | ICD-10-CM | POA: Diagnosis not present

## 2018-03-16 DIAGNOSIS — Z79891 Long term (current) use of opiate analgesic: Secondary | ICD-10-CM | POA: Diagnosis not present

## 2018-03-21 DIAGNOSIS — R600 Localized edema: Secondary | ICD-10-CM | POA: Diagnosis not present

## 2018-03-21 DIAGNOSIS — Z794 Long term (current) use of insulin: Secondary | ICD-10-CM | POA: Diagnosis not present

## 2018-03-21 DIAGNOSIS — L89313 Pressure ulcer of right buttock, stage 3: Secondary | ICD-10-CM | POA: Diagnosis not present

## 2018-03-21 DIAGNOSIS — Z87440 Personal history of urinary (tract) infections: Secondary | ICD-10-CM | POA: Diagnosis not present

## 2018-03-21 DIAGNOSIS — Z7982 Long term (current) use of aspirin: Secondary | ICD-10-CM | POA: Diagnosis not present

## 2018-03-21 DIAGNOSIS — L89312 Pressure ulcer of right buttock, stage 2: Secondary | ICD-10-CM | POA: Diagnosis not present

## 2018-03-21 DIAGNOSIS — N319 Neuromuscular dysfunction of bladder, unspecified: Secondary | ICD-10-CM | POA: Diagnosis not present

## 2018-03-21 DIAGNOSIS — M5416 Radiculopathy, lumbar region: Secondary | ICD-10-CM | POA: Diagnosis not present

## 2018-03-21 DIAGNOSIS — Z9181 History of falling: Secondary | ICD-10-CM | POA: Diagnosis not present

## 2018-03-21 DIAGNOSIS — R339 Retention of urine, unspecified: Secondary | ICD-10-CM | POA: Diagnosis not present

## 2018-03-21 DIAGNOSIS — E1142 Type 2 diabetes mellitus with diabetic polyneuropathy: Secondary | ICD-10-CM | POA: Diagnosis not present

## 2018-03-21 DIAGNOSIS — Z8631 Personal history of diabetic foot ulcer: Secondary | ICD-10-CM | POA: Diagnosis not present

## 2018-03-21 DIAGNOSIS — Z79891 Long term (current) use of opiate analgesic: Secondary | ICD-10-CM | POA: Diagnosis not present

## 2018-03-21 DIAGNOSIS — I10 Essential (primary) hypertension: Secondary | ICD-10-CM | POA: Diagnosis not present

## 2018-03-21 DIAGNOSIS — Z466 Encounter for fitting and adjustment of urinary device: Secondary | ICD-10-CM | POA: Diagnosis not present

## 2018-03-21 DIAGNOSIS — Z4800 Encounter for change or removal of nonsurgical wound dressing: Secondary | ICD-10-CM | POA: Diagnosis not present

## 2018-03-23 DIAGNOSIS — E1142 Type 2 diabetes mellitus with diabetic polyneuropathy: Secondary | ICD-10-CM | POA: Diagnosis not present

## 2018-03-23 DIAGNOSIS — N319 Neuromuscular dysfunction of bladder, unspecified: Secondary | ICD-10-CM | POA: Diagnosis not present

## 2018-03-23 DIAGNOSIS — Z7982 Long term (current) use of aspirin: Secondary | ICD-10-CM | POA: Diagnosis not present

## 2018-03-23 DIAGNOSIS — Z87440 Personal history of urinary (tract) infections: Secondary | ICD-10-CM | POA: Diagnosis not present

## 2018-03-23 DIAGNOSIS — Z8631 Personal history of diabetic foot ulcer: Secondary | ICD-10-CM | POA: Diagnosis not present

## 2018-03-23 DIAGNOSIS — I10 Essential (primary) hypertension: Secondary | ICD-10-CM | POA: Diagnosis not present

## 2018-03-23 DIAGNOSIS — L89312 Pressure ulcer of right buttock, stage 2: Secondary | ICD-10-CM | POA: Diagnosis not present

## 2018-03-23 DIAGNOSIS — Z466 Encounter for fitting and adjustment of urinary device: Secondary | ICD-10-CM | POA: Diagnosis not present

## 2018-03-23 DIAGNOSIS — R600 Localized edema: Secondary | ICD-10-CM | POA: Diagnosis not present

## 2018-03-23 DIAGNOSIS — Z9181 History of falling: Secondary | ICD-10-CM | POA: Diagnosis not present

## 2018-03-23 DIAGNOSIS — Z794 Long term (current) use of insulin: Secondary | ICD-10-CM | POA: Diagnosis not present

## 2018-03-23 DIAGNOSIS — R339 Retention of urine, unspecified: Secondary | ICD-10-CM | POA: Diagnosis not present

## 2018-03-23 DIAGNOSIS — M5416 Radiculopathy, lumbar region: Secondary | ICD-10-CM | POA: Diagnosis not present

## 2018-03-23 DIAGNOSIS — Z79891 Long term (current) use of opiate analgesic: Secondary | ICD-10-CM | POA: Diagnosis not present

## 2018-03-23 DIAGNOSIS — Z4800 Encounter for change or removal of nonsurgical wound dressing: Secondary | ICD-10-CM | POA: Diagnosis not present

## 2018-03-26 DIAGNOSIS — I6350 Cerebral infarction due to unspecified occlusion or stenosis of unspecified cerebral artery: Secondary | ICD-10-CM | POA: Diagnosis not present

## 2018-03-26 DIAGNOSIS — E1142 Type 2 diabetes mellitus with diabetic polyneuropathy: Secondary | ICD-10-CM | POA: Diagnosis not present

## 2018-03-26 DIAGNOSIS — R609 Edema, unspecified: Secondary | ICD-10-CM | POA: Diagnosis not present

## 2018-03-26 DIAGNOSIS — I10 Essential (primary) hypertension: Secondary | ICD-10-CM | POA: Diagnosis not present

## 2018-03-28 DIAGNOSIS — R339 Retention of urine, unspecified: Secondary | ICD-10-CM | POA: Diagnosis not present

## 2018-03-28 DIAGNOSIS — R600 Localized edema: Secondary | ICD-10-CM | POA: Diagnosis not present

## 2018-03-28 DIAGNOSIS — M5416 Radiculopathy, lumbar region: Secondary | ICD-10-CM | POA: Diagnosis not present

## 2018-03-28 DIAGNOSIS — Z7982 Long term (current) use of aspirin: Secondary | ICD-10-CM | POA: Diagnosis not present

## 2018-03-28 DIAGNOSIS — Z9181 History of falling: Secondary | ICD-10-CM | POA: Diagnosis not present

## 2018-03-28 DIAGNOSIS — L89312 Pressure ulcer of right buttock, stage 2: Secondary | ICD-10-CM | POA: Diagnosis not present

## 2018-03-28 DIAGNOSIS — E1142 Type 2 diabetes mellitus with diabetic polyneuropathy: Secondary | ICD-10-CM | POA: Diagnosis not present

## 2018-03-28 DIAGNOSIS — Z4800 Encounter for change or removal of nonsurgical wound dressing: Secondary | ICD-10-CM | POA: Diagnosis not present

## 2018-03-28 DIAGNOSIS — I10 Essential (primary) hypertension: Secondary | ICD-10-CM | POA: Diagnosis not present

## 2018-03-28 DIAGNOSIS — Z8631 Personal history of diabetic foot ulcer: Secondary | ICD-10-CM | POA: Diagnosis not present

## 2018-03-28 DIAGNOSIS — Z794 Long term (current) use of insulin: Secondary | ICD-10-CM | POA: Diagnosis not present

## 2018-03-28 DIAGNOSIS — N319 Neuromuscular dysfunction of bladder, unspecified: Secondary | ICD-10-CM | POA: Diagnosis not present

## 2018-03-28 DIAGNOSIS — Z466 Encounter for fitting and adjustment of urinary device: Secondary | ICD-10-CM | POA: Diagnosis not present

## 2018-03-28 DIAGNOSIS — Z79891 Long term (current) use of opiate analgesic: Secondary | ICD-10-CM | POA: Diagnosis not present

## 2018-03-28 DIAGNOSIS — Z87440 Personal history of urinary (tract) infections: Secondary | ICD-10-CM | POA: Diagnosis not present

## 2018-03-30 DIAGNOSIS — E1142 Type 2 diabetes mellitus with diabetic polyneuropathy: Secondary | ICD-10-CM | POA: Diagnosis not present

## 2018-03-30 DIAGNOSIS — Z466 Encounter for fitting and adjustment of urinary device: Secondary | ICD-10-CM | POA: Diagnosis not present

## 2018-03-30 DIAGNOSIS — L89312 Pressure ulcer of right buttock, stage 2: Secondary | ICD-10-CM | POA: Diagnosis not present

## 2018-03-30 DIAGNOSIS — I10 Essential (primary) hypertension: Secondary | ICD-10-CM | POA: Diagnosis not present

## 2018-03-30 DIAGNOSIS — R600 Localized edema: Secondary | ICD-10-CM | POA: Diagnosis not present

## 2018-03-30 DIAGNOSIS — Z87440 Personal history of urinary (tract) infections: Secondary | ICD-10-CM | POA: Diagnosis not present

## 2018-03-30 DIAGNOSIS — M5416 Radiculopathy, lumbar region: Secondary | ICD-10-CM | POA: Diagnosis not present

## 2018-03-30 DIAGNOSIS — Z8631 Personal history of diabetic foot ulcer: Secondary | ICD-10-CM | POA: Diagnosis not present

## 2018-03-30 DIAGNOSIS — Z794 Long term (current) use of insulin: Secondary | ICD-10-CM | POA: Diagnosis not present

## 2018-03-30 DIAGNOSIS — Z9181 History of falling: Secondary | ICD-10-CM | POA: Diagnosis not present

## 2018-03-30 DIAGNOSIS — Z7982 Long term (current) use of aspirin: Secondary | ICD-10-CM | POA: Diagnosis not present

## 2018-03-30 DIAGNOSIS — Z4800 Encounter for change or removal of nonsurgical wound dressing: Secondary | ICD-10-CM | POA: Diagnosis not present

## 2018-03-30 DIAGNOSIS — N319 Neuromuscular dysfunction of bladder, unspecified: Secondary | ICD-10-CM | POA: Diagnosis not present

## 2018-03-30 DIAGNOSIS — R339 Retention of urine, unspecified: Secondary | ICD-10-CM | POA: Diagnosis not present

## 2018-03-30 DIAGNOSIS — Z79891 Long term (current) use of opiate analgesic: Secondary | ICD-10-CM | POA: Diagnosis not present

## 2018-03-31 ENCOUNTER — Ambulatory Visit: Payer: Medicare Other | Admitting: Cardiology

## 2018-04-03 DIAGNOSIS — Z8631 Personal history of diabetic foot ulcer: Secondary | ICD-10-CM | POA: Diagnosis not present

## 2018-04-03 DIAGNOSIS — R339 Retention of urine, unspecified: Secondary | ICD-10-CM | POA: Diagnosis not present

## 2018-04-03 DIAGNOSIS — I10 Essential (primary) hypertension: Secondary | ICD-10-CM | POA: Diagnosis not present

## 2018-04-03 DIAGNOSIS — Z87440 Personal history of urinary (tract) infections: Secondary | ICD-10-CM | POA: Diagnosis not present

## 2018-04-03 DIAGNOSIS — M5416 Radiculopathy, lumbar region: Secondary | ICD-10-CM | POA: Diagnosis not present

## 2018-04-03 DIAGNOSIS — Z9181 History of falling: Secondary | ICD-10-CM | POA: Diagnosis not present

## 2018-04-03 DIAGNOSIS — Z4800 Encounter for change or removal of nonsurgical wound dressing: Secondary | ICD-10-CM | POA: Diagnosis not present

## 2018-04-03 DIAGNOSIS — Z7982 Long term (current) use of aspirin: Secondary | ICD-10-CM | POA: Diagnosis not present

## 2018-04-03 DIAGNOSIS — N319 Neuromuscular dysfunction of bladder, unspecified: Secondary | ICD-10-CM | POA: Diagnosis not present

## 2018-04-03 DIAGNOSIS — E1142 Type 2 diabetes mellitus with diabetic polyneuropathy: Secondary | ICD-10-CM | POA: Diagnosis not present

## 2018-04-03 DIAGNOSIS — L89312 Pressure ulcer of right buttock, stage 2: Secondary | ICD-10-CM | POA: Diagnosis not present

## 2018-04-03 DIAGNOSIS — Z794 Long term (current) use of insulin: Secondary | ICD-10-CM | POA: Diagnosis not present

## 2018-04-03 DIAGNOSIS — Z79891 Long term (current) use of opiate analgesic: Secondary | ICD-10-CM | POA: Diagnosis not present

## 2018-04-03 DIAGNOSIS — Z466 Encounter for fitting and adjustment of urinary device: Secondary | ICD-10-CM | POA: Diagnosis not present

## 2018-04-03 DIAGNOSIS — R600 Localized edema: Secondary | ICD-10-CM | POA: Diagnosis not present

## 2018-04-04 DIAGNOSIS — R339 Retention of urine, unspecified: Secondary | ICD-10-CM | POA: Diagnosis not present

## 2018-04-04 DIAGNOSIS — N319 Neuromuscular dysfunction of bladder, unspecified: Secondary | ICD-10-CM | POA: Diagnosis not present

## 2018-04-04 DIAGNOSIS — Z9181 History of falling: Secondary | ICD-10-CM | POA: Diagnosis not present

## 2018-04-04 DIAGNOSIS — Z8631 Personal history of diabetic foot ulcer: Secondary | ICD-10-CM | POA: Diagnosis not present

## 2018-04-04 DIAGNOSIS — Z7982 Long term (current) use of aspirin: Secondary | ICD-10-CM | POA: Diagnosis not present

## 2018-04-04 DIAGNOSIS — L89312 Pressure ulcer of right buttock, stage 2: Secondary | ICD-10-CM | POA: Diagnosis not present

## 2018-04-04 DIAGNOSIS — Z466 Encounter for fitting and adjustment of urinary device: Secondary | ICD-10-CM | POA: Diagnosis not present

## 2018-04-04 DIAGNOSIS — Z794 Long term (current) use of insulin: Secondary | ICD-10-CM | POA: Diagnosis not present

## 2018-04-04 DIAGNOSIS — E1142 Type 2 diabetes mellitus with diabetic polyneuropathy: Secondary | ICD-10-CM | POA: Diagnosis not present

## 2018-04-04 DIAGNOSIS — I10 Essential (primary) hypertension: Secondary | ICD-10-CM | POA: Diagnosis not present

## 2018-04-04 DIAGNOSIS — Z4800 Encounter for change or removal of nonsurgical wound dressing: Secondary | ICD-10-CM | POA: Diagnosis not present

## 2018-04-04 DIAGNOSIS — Z87440 Personal history of urinary (tract) infections: Secondary | ICD-10-CM | POA: Diagnosis not present

## 2018-04-04 DIAGNOSIS — R600 Localized edema: Secondary | ICD-10-CM | POA: Diagnosis not present

## 2018-04-04 DIAGNOSIS — M5416 Radiculopathy, lumbar region: Secondary | ICD-10-CM | POA: Diagnosis not present

## 2018-04-04 DIAGNOSIS — Z79891 Long term (current) use of opiate analgesic: Secondary | ICD-10-CM | POA: Diagnosis not present

## 2018-04-06 ENCOUNTER — Other Ambulatory Visit (HOSPITAL_COMMUNITY)
Admission: AD | Admit: 2018-04-06 | Discharge: 2018-04-06 | Disposition: A | Discharge status: Home / Self Care | Payer: Medicare Other | Source: Skilled Nursing Facility | Attending: Family Medicine | Admitting: Family Medicine

## 2018-04-06 DIAGNOSIS — Z4800 Encounter for change or removal of nonsurgical wound dressing: Secondary | ICD-10-CM | POA: Diagnosis not present

## 2018-04-06 DIAGNOSIS — N390 Urinary tract infection, site not specified: Secondary | ICD-10-CM | POA: Diagnosis not present

## 2018-04-06 DIAGNOSIS — Z7982 Long term (current) use of aspirin: Secondary | ICD-10-CM | POA: Diagnosis not present

## 2018-04-06 DIAGNOSIS — R600 Localized edema: Secondary | ICD-10-CM | POA: Diagnosis not present

## 2018-04-06 DIAGNOSIS — Z466 Encounter for fitting and adjustment of urinary device: Secondary | ICD-10-CM | POA: Diagnosis not present

## 2018-04-06 DIAGNOSIS — Z9181 History of falling: Secondary | ICD-10-CM | POA: Diagnosis not present

## 2018-04-06 DIAGNOSIS — Z79891 Long term (current) use of opiate analgesic: Secondary | ICD-10-CM | POA: Diagnosis not present

## 2018-04-06 DIAGNOSIS — Z8631 Personal history of diabetic foot ulcer: Secondary | ICD-10-CM | POA: Diagnosis not present

## 2018-04-06 DIAGNOSIS — I10 Essential (primary) hypertension: Secondary | ICD-10-CM | POA: Diagnosis not present

## 2018-04-06 DIAGNOSIS — E1142 Type 2 diabetes mellitus with diabetic polyneuropathy: Secondary | ICD-10-CM | POA: Diagnosis not present

## 2018-04-06 DIAGNOSIS — N319 Neuromuscular dysfunction of bladder, unspecified: Secondary | ICD-10-CM | POA: Diagnosis not present

## 2018-04-06 DIAGNOSIS — Z794 Long term (current) use of insulin: Secondary | ICD-10-CM | POA: Diagnosis not present

## 2018-04-06 DIAGNOSIS — L89312 Pressure ulcer of right buttock, stage 2: Secondary | ICD-10-CM | POA: Diagnosis not present

## 2018-04-06 DIAGNOSIS — R339 Retention of urine, unspecified: Secondary | ICD-10-CM | POA: Diagnosis not present

## 2018-04-06 DIAGNOSIS — M5416 Radiculopathy, lumbar region: Secondary | ICD-10-CM | POA: Diagnosis not present

## 2018-04-06 DIAGNOSIS — Z87440 Personal history of urinary (tract) infections: Secondary | ICD-10-CM | POA: Diagnosis not present

## 2018-04-06 LAB — URINALYSIS, COMPLETE (UACMP) WITH MICROSCOPIC
Bilirubin Urine: NEGATIVE
Glucose, UA: >=500 mg/dL — AB
Ketones, ur: NEGATIVE mg/dL
Nitrite: NEGATIVE
Protein, ur: 100 mg/dL — AB
SPECIFIC GRAVITY, URINE: 1.015 (ref 1.005–1.030)
pH: 5 (ref 5.0–8.0)

## 2018-04-10 DIAGNOSIS — Z466 Encounter for fitting and adjustment of urinary device: Secondary | ICD-10-CM | POA: Diagnosis not present

## 2018-04-10 DIAGNOSIS — Z4800 Encounter for change or removal of nonsurgical wound dressing: Secondary | ICD-10-CM | POA: Diagnosis not present

## 2018-04-10 DIAGNOSIS — Z7982 Long term (current) use of aspirin: Secondary | ICD-10-CM | POA: Diagnosis not present

## 2018-04-10 DIAGNOSIS — Z9181 History of falling: Secondary | ICD-10-CM | POA: Diagnosis not present

## 2018-04-10 DIAGNOSIS — Z87440 Personal history of urinary (tract) infections: Secondary | ICD-10-CM | POA: Diagnosis not present

## 2018-04-10 DIAGNOSIS — R339 Retention of urine, unspecified: Secondary | ICD-10-CM | POA: Diagnosis not present

## 2018-04-10 DIAGNOSIS — R600 Localized edema: Secondary | ICD-10-CM | POA: Diagnosis not present

## 2018-04-10 DIAGNOSIS — I10 Essential (primary) hypertension: Secondary | ICD-10-CM | POA: Diagnosis not present

## 2018-04-10 DIAGNOSIS — N319 Neuromuscular dysfunction of bladder, unspecified: Secondary | ICD-10-CM | POA: Diagnosis not present

## 2018-04-10 DIAGNOSIS — Z794 Long term (current) use of insulin: Secondary | ICD-10-CM | POA: Diagnosis not present

## 2018-04-10 DIAGNOSIS — M5416 Radiculopathy, lumbar region: Secondary | ICD-10-CM | POA: Diagnosis not present

## 2018-04-10 DIAGNOSIS — Z8631 Personal history of diabetic foot ulcer: Secondary | ICD-10-CM | POA: Diagnosis not present

## 2018-04-10 DIAGNOSIS — Z79891 Long term (current) use of opiate analgesic: Secondary | ICD-10-CM | POA: Diagnosis not present

## 2018-04-10 DIAGNOSIS — L89312 Pressure ulcer of right buttock, stage 2: Secondary | ICD-10-CM | POA: Diagnosis not present

## 2018-04-10 DIAGNOSIS — E1142 Type 2 diabetes mellitus with diabetic polyneuropathy: Secondary | ICD-10-CM | POA: Diagnosis not present

## 2018-04-10 LAB — URINE CULTURE: Culture: >=100000 — AB

## 2018-04-11 DIAGNOSIS — M169 Osteoarthritis of hip, unspecified: Secondary | ICD-10-CM | POA: Diagnosis not present

## 2018-04-11 DIAGNOSIS — E1142 Type 2 diabetes mellitus with diabetic polyneuropathy: Secondary | ICD-10-CM | POA: Diagnosis not present

## 2018-04-11 DIAGNOSIS — M47816 Spondylosis without myelopathy or radiculopathy, lumbar region: Secondary | ICD-10-CM | POA: Diagnosis not present

## 2018-04-11 DIAGNOSIS — G894 Chronic pain syndrome: Secondary | ICD-10-CM | POA: Diagnosis not present

## 2018-04-13 DIAGNOSIS — Z4800 Encounter for change or removal of nonsurgical wound dressing: Secondary | ICD-10-CM | POA: Diagnosis not present

## 2018-04-13 DIAGNOSIS — Z9181 History of falling: Secondary | ICD-10-CM | POA: Diagnosis not present

## 2018-04-13 DIAGNOSIS — Z7982 Long term (current) use of aspirin: Secondary | ICD-10-CM | POA: Diagnosis not present

## 2018-04-13 DIAGNOSIS — Z87440 Personal history of urinary (tract) infections: Secondary | ICD-10-CM | POA: Diagnosis not present

## 2018-04-13 DIAGNOSIS — R600 Localized edema: Secondary | ICD-10-CM | POA: Diagnosis not present

## 2018-04-13 DIAGNOSIS — E1142 Type 2 diabetes mellitus with diabetic polyneuropathy: Secondary | ICD-10-CM | POA: Diagnosis not present

## 2018-04-13 DIAGNOSIS — M5416 Radiculopathy, lumbar region: Secondary | ICD-10-CM | POA: Diagnosis not present

## 2018-04-13 DIAGNOSIS — Z8631 Personal history of diabetic foot ulcer: Secondary | ICD-10-CM | POA: Diagnosis not present

## 2018-04-13 DIAGNOSIS — Z794 Long term (current) use of insulin: Secondary | ICD-10-CM | POA: Diagnosis not present

## 2018-04-13 DIAGNOSIS — N319 Neuromuscular dysfunction of bladder, unspecified: Secondary | ICD-10-CM | POA: Diagnosis not present

## 2018-04-13 DIAGNOSIS — I10 Essential (primary) hypertension: Secondary | ICD-10-CM | POA: Diagnosis not present

## 2018-04-13 DIAGNOSIS — Z79891 Long term (current) use of opiate analgesic: Secondary | ICD-10-CM | POA: Diagnosis not present

## 2018-04-13 DIAGNOSIS — Z466 Encounter for fitting and adjustment of urinary device: Secondary | ICD-10-CM | POA: Diagnosis not present

## 2018-04-13 DIAGNOSIS — L89312 Pressure ulcer of right buttock, stage 2: Secondary | ICD-10-CM | POA: Diagnosis not present

## 2018-04-13 DIAGNOSIS — R339 Retention of urine, unspecified: Secondary | ICD-10-CM | POA: Diagnosis not present

## 2018-04-18 DIAGNOSIS — L89313 Pressure ulcer of right buttock, stage 3: Secondary | ICD-10-CM | POA: Diagnosis not present

## 2018-04-18 DIAGNOSIS — L89323 Pressure ulcer of left buttock, stage 3: Secondary | ICD-10-CM | POA: Diagnosis not present

## 2018-04-19 DIAGNOSIS — M5416 Radiculopathy, lumbar region: Secondary | ICD-10-CM | POA: Diagnosis not present

## 2018-04-19 DIAGNOSIS — Z8631 Personal history of diabetic foot ulcer: Secondary | ICD-10-CM | POA: Diagnosis not present

## 2018-04-19 DIAGNOSIS — I10 Essential (primary) hypertension: Secondary | ICD-10-CM | POA: Diagnosis not present

## 2018-04-19 DIAGNOSIS — Z4800 Encounter for change or removal of nonsurgical wound dressing: Secondary | ICD-10-CM | POA: Diagnosis not present

## 2018-04-19 DIAGNOSIS — Z466 Encounter for fitting and adjustment of urinary device: Secondary | ICD-10-CM | POA: Diagnosis not present

## 2018-04-19 DIAGNOSIS — N319 Neuromuscular dysfunction of bladder, unspecified: Secondary | ICD-10-CM | POA: Diagnosis not present

## 2018-04-19 DIAGNOSIS — Z794 Long term (current) use of insulin: Secondary | ICD-10-CM | POA: Diagnosis not present

## 2018-04-19 DIAGNOSIS — R600 Localized edema: Secondary | ICD-10-CM | POA: Diagnosis not present

## 2018-04-19 DIAGNOSIS — R339 Retention of urine, unspecified: Secondary | ICD-10-CM | POA: Diagnosis not present

## 2018-04-19 DIAGNOSIS — Z7982 Long term (current) use of aspirin: Secondary | ICD-10-CM | POA: Diagnosis not present

## 2018-04-19 DIAGNOSIS — Z79891 Long term (current) use of opiate analgesic: Secondary | ICD-10-CM | POA: Diagnosis not present

## 2018-04-19 DIAGNOSIS — Z87440 Personal history of urinary (tract) infections: Secondary | ICD-10-CM | POA: Diagnosis not present

## 2018-04-19 DIAGNOSIS — Z9181 History of falling: Secondary | ICD-10-CM | POA: Diagnosis not present

## 2018-04-19 DIAGNOSIS — E1142 Type 2 diabetes mellitus with diabetic polyneuropathy: Secondary | ICD-10-CM | POA: Diagnosis not present

## 2018-04-19 DIAGNOSIS — L89312 Pressure ulcer of right buttock, stage 2: Secondary | ICD-10-CM | POA: Diagnosis not present

## 2018-04-20 DIAGNOSIS — Z4800 Encounter for change or removal of nonsurgical wound dressing: Secondary | ICD-10-CM | POA: Diagnosis not present

## 2018-04-20 DIAGNOSIS — L89312 Pressure ulcer of right buttock, stage 2: Secondary | ICD-10-CM | POA: Diagnosis not present

## 2018-04-20 DIAGNOSIS — Z9181 History of falling: Secondary | ICD-10-CM | POA: Diagnosis not present

## 2018-04-20 DIAGNOSIS — Z87440 Personal history of urinary (tract) infections: Secondary | ICD-10-CM | POA: Diagnosis not present

## 2018-04-20 DIAGNOSIS — Z7982 Long term (current) use of aspirin: Secondary | ICD-10-CM | POA: Diagnosis not present

## 2018-04-20 DIAGNOSIS — N319 Neuromuscular dysfunction of bladder, unspecified: Secondary | ICD-10-CM | POA: Diagnosis not present

## 2018-04-20 DIAGNOSIS — Z79891 Long term (current) use of opiate analgesic: Secondary | ICD-10-CM | POA: Diagnosis not present

## 2018-04-20 DIAGNOSIS — R600 Localized edema: Secondary | ICD-10-CM | POA: Diagnosis not present

## 2018-04-20 DIAGNOSIS — Z466 Encounter for fitting and adjustment of urinary device: Secondary | ICD-10-CM | POA: Diagnosis not present

## 2018-04-20 DIAGNOSIS — M5416 Radiculopathy, lumbar region: Secondary | ICD-10-CM | POA: Diagnosis not present

## 2018-04-20 DIAGNOSIS — R339 Retention of urine, unspecified: Secondary | ICD-10-CM | POA: Diagnosis not present

## 2018-04-20 DIAGNOSIS — Z8631 Personal history of diabetic foot ulcer: Secondary | ICD-10-CM | POA: Diagnosis not present

## 2018-04-20 DIAGNOSIS — Z794 Long term (current) use of insulin: Secondary | ICD-10-CM | POA: Diagnosis not present

## 2018-04-20 DIAGNOSIS — E1142 Type 2 diabetes mellitus with diabetic polyneuropathy: Secondary | ICD-10-CM | POA: Diagnosis not present

## 2018-04-20 DIAGNOSIS — I10 Essential (primary) hypertension: Secondary | ICD-10-CM | POA: Diagnosis not present

## 2018-04-24 DIAGNOSIS — R339 Retention of urine, unspecified: Secondary | ICD-10-CM | POA: Diagnosis not present

## 2018-04-24 DIAGNOSIS — Z7982 Long term (current) use of aspirin: Secondary | ICD-10-CM | POA: Diagnosis not present

## 2018-04-24 DIAGNOSIS — Z466 Encounter for fitting and adjustment of urinary device: Secondary | ICD-10-CM | POA: Diagnosis not present

## 2018-04-24 DIAGNOSIS — L89312 Pressure ulcer of right buttock, stage 2: Secondary | ICD-10-CM | POA: Diagnosis not present

## 2018-04-24 DIAGNOSIS — Z794 Long term (current) use of insulin: Secondary | ICD-10-CM | POA: Diagnosis not present

## 2018-04-24 DIAGNOSIS — R600 Localized edema: Secondary | ICD-10-CM | POA: Diagnosis not present

## 2018-04-24 DIAGNOSIS — Z8631 Personal history of diabetic foot ulcer: Secondary | ICD-10-CM | POA: Diagnosis not present

## 2018-04-24 DIAGNOSIS — Z4800 Encounter for change or removal of nonsurgical wound dressing: Secondary | ICD-10-CM | POA: Diagnosis not present

## 2018-04-24 DIAGNOSIS — E1142 Type 2 diabetes mellitus with diabetic polyneuropathy: Secondary | ICD-10-CM | POA: Diagnosis not present

## 2018-04-24 DIAGNOSIS — I10 Essential (primary) hypertension: Secondary | ICD-10-CM | POA: Diagnosis not present

## 2018-04-24 DIAGNOSIS — M5416 Radiculopathy, lumbar region: Secondary | ICD-10-CM | POA: Diagnosis not present

## 2018-04-24 DIAGNOSIS — N319 Neuromuscular dysfunction of bladder, unspecified: Secondary | ICD-10-CM | POA: Diagnosis not present

## 2018-04-24 DIAGNOSIS — Z79891 Long term (current) use of opiate analgesic: Secondary | ICD-10-CM | POA: Diagnosis not present

## 2018-04-24 DIAGNOSIS — Z9181 History of falling: Secondary | ICD-10-CM | POA: Diagnosis not present

## 2018-04-24 DIAGNOSIS — Z87440 Personal history of urinary (tract) infections: Secondary | ICD-10-CM | POA: Diagnosis not present

## 2018-04-26 DIAGNOSIS — M5416 Radiculopathy, lumbar region: Secondary | ICD-10-CM | POA: Diagnosis not present

## 2018-04-26 DIAGNOSIS — N319 Neuromuscular dysfunction of bladder, unspecified: Secondary | ICD-10-CM | POA: Diagnosis not present

## 2018-04-26 DIAGNOSIS — Z9181 History of falling: Secondary | ICD-10-CM | POA: Diagnosis not present

## 2018-04-26 DIAGNOSIS — I10 Essential (primary) hypertension: Secondary | ICD-10-CM | POA: Diagnosis not present

## 2018-04-26 DIAGNOSIS — R600 Localized edema: Secondary | ICD-10-CM | POA: Diagnosis not present

## 2018-04-26 DIAGNOSIS — Z4800 Encounter for change or removal of nonsurgical wound dressing: Secondary | ICD-10-CM | POA: Diagnosis not present

## 2018-04-26 DIAGNOSIS — Z87440 Personal history of urinary (tract) infections: Secondary | ICD-10-CM | POA: Diagnosis not present

## 2018-04-26 DIAGNOSIS — R339 Retention of urine, unspecified: Secondary | ICD-10-CM | POA: Diagnosis not present

## 2018-04-26 DIAGNOSIS — L89312 Pressure ulcer of right buttock, stage 2: Secondary | ICD-10-CM | POA: Diagnosis not present

## 2018-04-26 DIAGNOSIS — Z79891 Long term (current) use of opiate analgesic: Secondary | ICD-10-CM | POA: Diagnosis not present

## 2018-04-26 DIAGNOSIS — Z8631 Personal history of diabetic foot ulcer: Secondary | ICD-10-CM | POA: Diagnosis not present

## 2018-04-26 DIAGNOSIS — Z7982 Long term (current) use of aspirin: Secondary | ICD-10-CM | POA: Diagnosis not present

## 2018-04-26 DIAGNOSIS — E1142 Type 2 diabetes mellitus with diabetic polyneuropathy: Secondary | ICD-10-CM | POA: Diagnosis not present

## 2018-04-26 DIAGNOSIS — Z466 Encounter for fitting and adjustment of urinary device: Secondary | ICD-10-CM | POA: Diagnosis not present

## 2018-04-26 DIAGNOSIS — Z794 Long term (current) use of insulin: Secondary | ICD-10-CM | POA: Diagnosis not present

## 2018-04-28 DIAGNOSIS — E1142 Type 2 diabetes mellitus with diabetic polyneuropathy: Secondary | ICD-10-CM | POA: Diagnosis not present

## 2018-04-28 DIAGNOSIS — Z794 Long term (current) use of insulin: Secondary | ICD-10-CM | POA: Diagnosis not present

## 2018-04-28 DIAGNOSIS — Z7982 Long term (current) use of aspirin: Secondary | ICD-10-CM | POA: Diagnosis not present

## 2018-04-28 DIAGNOSIS — Z8631 Personal history of diabetic foot ulcer: Secondary | ICD-10-CM | POA: Diagnosis not present

## 2018-04-28 DIAGNOSIS — L89312 Pressure ulcer of right buttock, stage 2: Secondary | ICD-10-CM | POA: Diagnosis not present

## 2018-04-28 DIAGNOSIS — Z87440 Personal history of urinary (tract) infections: Secondary | ICD-10-CM | POA: Diagnosis not present

## 2018-04-28 DIAGNOSIS — R339 Retention of urine, unspecified: Secondary | ICD-10-CM | POA: Diagnosis not present

## 2018-04-28 DIAGNOSIS — R600 Localized edema: Secondary | ICD-10-CM | POA: Diagnosis not present

## 2018-04-28 DIAGNOSIS — I10 Essential (primary) hypertension: Secondary | ICD-10-CM | POA: Diagnosis not present

## 2018-04-28 DIAGNOSIS — Z4800 Encounter for change or removal of nonsurgical wound dressing: Secondary | ICD-10-CM | POA: Diagnosis not present

## 2018-04-28 DIAGNOSIS — M5416 Radiculopathy, lumbar region: Secondary | ICD-10-CM | POA: Diagnosis not present

## 2018-04-28 DIAGNOSIS — Z466 Encounter for fitting and adjustment of urinary device: Secondary | ICD-10-CM | POA: Diagnosis not present

## 2018-04-28 DIAGNOSIS — Z79891 Long term (current) use of opiate analgesic: Secondary | ICD-10-CM | POA: Diagnosis not present

## 2018-04-28 DIAGNOSIS — Z9181 History of falling: Secondary | ICD-10-CM | POA: Diagnosis not present

## 2018-04-28 DIAGNOSIS — N319 Neuromuscular dysfunction of bladder, unspecified: Secondary | ICD-10-CM | POA: Diagnosis not present

## 2018-05-01 DIAGNOSIS — E1142 Type 2 diabetes mellitus with diabetic polyneuropathy: Secondary | ICD-10-CM | POA: Diagnosis not present

## 2018-05-01 DIAGNOSIS — Z4800 Encounter for change or removal of nonsurgical wound dressing: Secondary | ICD-10-CM | POA: Diagnosis not present

## 2018-05-01 DIAGNOSIS — Z466 Encounter for fitting and adjustment of urinary device: Secondary | ICD-10-CM | POA: Diagnosis not present

## 2018-05-01 DIAGNOSIS — R600 Localized edema: Secondary | ICD-10-CM | POA: Diagnosis not present

## 2018-05-01 DIAGNOSIS — Z79891 Long term (current) use of opiate analgesic: Secondary | ICD-10-CM | POA: Diagnosis not present

## 2018-05-01 DIAGNOSIS — Z9181 History of falling: Secondary | ICD-10-CM | POA: Diagnosis not present

## 2018-05-01 DIAGNOSIS — L89312 Pressure ulcer of right buttock, stage 2: Secondary | ICD-10-CM | POA: Diagnosis not present

## 2018-05-01 DIAGNOSIS — M5416 Radiculopathy, lumbar region: Secondary | ICD-10-CM | POA: Diagnosis not present

## 2018-05-01 DIAGNOSIS — N319 Neuromuscular dysfunction of bladder, unspecified: Secondary | ICD-10-CM | POA: Diagnosis not present

## 2018-05-01 DIAGNOSIS — Z8631 Personal history of diabetic foot ulcer: Secondary | ICD-10-CM | POA: Diagnosis not present

## 2018-05-01 DIAGNOSIS — Z7982 Long term (current) use of aspirin: Secondary | ICD-10-CM | POA: Diagnosis not present

## 2018-05-01 DIAGNOSIS — R339 Retention of urine, unspecified: Secondary | ICD-10-CM | POA: Diagnosis not present

## 2018-05-01 DIAGNOSIS — Z794 Long term (current) use of insulin: Secondary | ICD-10-CM | POA: Diagnosis not present

## 2018-05-01 DIAGNOSIS — I10 Essential (primary) hypertension: Secondary | ICD-10-CM | POA: Diagnosis not present

## 2018-05-01 DIAGNOSIS — Z87440 Personal history of urinary (tract) infections: Secondary | ICD-10-CM | POA: Diagnosis not present

## 2018-05-03 DIAGNOSIS — Z466 Encounter for fitting and adjustment of urinary device: Secondary | ICD-10-CM | POA: Diagnosis not present

## 2018-05-03 DIAGNOSIS — Z4800 Encounter for change or removal of nonsurgical wound dressing: Secondary | ICD-10-CM | POA: Diagnosis not present

## 2018-05-03 DIAGNOSIS — R339 Retention of urine, unspecified: Secondary | ICD-10-CM | POA: Diagnosis not present

## 2018-05-03 DIAGNOSIS — L89312 Pressure ulcer of right buttock, stage 2: Secondary | ICD-10-CM | POA: Diagnosis not present

## 2018-05-03 DIAGNOSIS — Z79891 Long term (current) use of opiate analgesic: Secondary | ICD-10-CM | POA: Diagnosis not present

## 2018-05-03 DIAGNOSIS — Z87440 Personal history of urinary (tract) infections: Secondary | ICD-10-CM | POA: Diagnosis not present

## 2018-05-03 DIAGNOSIS — Z7982 Long term (current) use of aspirin: Secondary | ICD-10-CM | POA: Diagnosis not present

## 2018-05-03 DIAGNOSIS — I10 Essential (primary) hypertension: Secondary | ICD-10-CM | POA: Diagnosis not present

## 2018-05-03 DIAGNOSIS — Z8631 Personal history of diabetic foot ulcer: Secondary | ICD-10-CM | POA: Diagnosis not present

## 2018-05-03 DIAGNOSIS — Z794 Long term (current) use of insulin: Secondary | ICD-10-CM | POA: Diagnosis not present

## 2018-05-03 DIAGNOSIS — M5416 Radiculopathy, lumbar region: Secondary | ICD-10-CM | POA: Diagnosis not present

## 2018-05-03 DIAGNOSIS — R600 Localized edema: Secondary | ICD-10-CM | POA: Diagnosis not present

## 2018-05-03 DIAGNOSIS — E1142 Type 2 diabetes mellitus with diabetic polyneuropathy: Secondary | ICD-10-CM | POA: Diagnosis not present

## 2018-05-03 DIAGNOSIS — Z9181 History of falling: Secondary | ICD-10-CM | POA: Diagnosis not present

## 2018-05-03 DIAGNOSIS — N319 Neuromuscular dysfunction of bladder, unspecified: Secondary | ICD-10-CM | POA: Diagnosis not present

## 2018-05-05 DIAGNOSIS — R600 Localized edema: Secondary | ICD-10-CM | POA: Diagnosis not present

## 2018-05-05 DIAGNOSIS — Z87440 Personal history of urinary (tract) infections: Secondary | ICD-10-CM | POA: Diagnosis not present

## 2018-05-05 DIAGNOSIS — Z9181 History of falling: Secondary | ICD-10-CM | POA: Diagnosis not present

## 2018-05-05 DIAGNOSIS — Z7982 Long term (current) use of aspirin: Secondary | ICD-10-CM | POA: Diagnosis not present

## 2018-05-05 DIAGNOSIS — Z794 Long term (current) use of insulin: Secondary | ICD-10-CM | POA: Diagnosis not present

## 2018-05-05 DIAGNOSIS — M5416 Radiculopathy, lumbar region: Secondary | ICD-10-CM | POA: Diagnosis not present

## 2018-05-05 DIAGNOSIS — L89312 Pressure ulcer of right buttock, stage 2: Secondary | ICD-10-CM | POA: Diagnosis not present

## 2018-05-05 DIAGNOSIS — N319 Neuromuscular dysfunction of bladder, unspecified: Secondary | ICD-10-CM | POA: Diagnosis not present

## 2018-05-05 DIAGNOSIS — Z466 Encounter for fitting and adjustment of urinary device: Secondary | ICD-10-CM | POA: Diagnosis not present

## 2018-05-05 DIAGNOSIS — Z4800 Encounter for change or removal of nonsurgical wound dressing: Secondary | ICD-10-CM | POA: Diagnosis not present

## 2018-05-05 DIAGNOSIS — Z8631 Personal history of diabetic foot ulcer: Secondary | ICD-10-CM | POA: Diagnosis not present

## 2018-05-05 DIAGNOSIS — I10 Essential (primary) hypertension: Secondary | ICD-10-CM | POA: Diagnosis not present

## 2018-05-05 DIAGNOSIS — R339 Retention of urine, unspecified: Secondary | ICD-10-CM | POA: Diagnosis not present

## 2018-05-05 DIAGNOSIS — Z79891 Long term (current) use of opiate analgesic: Secondary | ICD-10-CM | POA: Diagnosis not present

## 2018-05-05 DIAGNOSIS — E1142 Type 2 diabetes mellitus with diabetic polyneuropathy: Secondary | ICD-10-CM | POA: Diagnosis not present

## 2018-05-08 DIAGNOSIS — M5416 Radiculopathy, lumbar region: Secondary | ICD-10-CM | POA: Diagnosis not present

## 2018-05-08 DIAGNOSIS — L89312 Pressure ulcer of right buttock, stage 2: Secondary | ICD-10-CM | POA: Diagnosis not present

## 2018-05-08 DIAGNOSIS — Z8631 Personal history of diabetic foot ulcer: Secondary | ICD-10-CM | POA: Diagnosis not present

## 2018-05-08 DIAGNOSIS — Z79891 Long term (current) use of opiate analgesic: Secondary | ICD-10-CM | POA: Diagnosis not present

## 2018-05-08 DIAGNOSIS — Z7982 Long term (current) use of aspirin: Secondary | ICD-10-CM | POA: Diagnosis not present

## 2018-05-08 DIAGNOSIS — N319 Neuromuscular dysfunction of bladder, unspecified: Secondary | ICD-10-CM | POA: Diagnosis not present

## 2018-05-08 DIAGNOSIS — Z4800 Encounter for change or removal of nonsurgical wound dressing: Secondary | ICD-10-CM | POA: Diagnosis not present

## 2018-05-08 DIAGNOSIS — R339 Retention of urine, unspecified: Secondary | ICD-10-CM | POA: Diagnosis not present

## 2018-05-08 DIAGNOSIS — Z9181 History of falling: Secondary | ICD-10-CM | POA: Diagnosis not present

## 2018-05-08 DIAGNOSIS — R600 Localized edema: Secondary | ICD-10-CM | POA: Diagnosis not present

## 2018-05-08 DIAGNOSIS — Z466 Encounter for fitting and adjustment of urinary device: Secondary | ICD-10-CM | POA: Diagnosis not present

## 2018-05-08 DIAGNOSIS — Z794 Long term (current) use of insulin: Secondary | ICD-10-CM | POA: Diagnosis not present

## 2018-05-08 DIAGNOSIS — Z87440 Personal history of urinary (tract) infections: Secondary | ICD-10-CM | POA: Diagnosis not present

## 2018-05-08 DIAGNOSIS — E1142 Type 2 diabetes mellitus with diabetic polyneuropathy: Secondary | ICD-10-CM | POA: Diagnosis not present

## 2018-05-08 DIAGNOSIS — I10 Essential (primary) hypertension: Secondary | ICD-10-CM | POA: Diagnosis not present

## 2018-05-09 DIAGNOSIS — G894 Chronic pain syndrome: Secondary | ICD-10-CM | POA: Diagnosis not present

## 2018-05-09 DIAGNOSIS — M25559 Pain in unspecified hip: Secondary | ICD-10-CM | POA: Diagnosis not present

## 2018-05-09 DIAGNOSIS — M47816 Spondylosis without myelopathy or radiculopathy, lumbar region: Secondary | ICD-10-CM | POA: Diagnosis not present

## 2018-05-09 DIAGNOSIS — Z79899 Other long term (current) drug therapy: Secondary | ICD-10-CM | POA: Diagnosis not present

## 2018-05-09 DIAGNOSIS — M169 Osteoarthritis of hip, unspecified: Secondary | ICD-10-CM | POA: Diagnosis not present

## 2018-05-09 DIAGNOSIS — Z79891 Long term (current) use of opiate analgesic: Secondary | ICD-10-CM | POA: Diagnosis not present

## 2018-05-10 DIAGNOSIS — Z8631 Personal history of diabetic foot ulcer: Secondary | ICD-10-CM | POA: Diagnosis not present

## 2018-05-10 DIAGNOSIS — N319 Neuromuscular dysfunction of bladder, unspecified: Secondary | ICD-10-CM | POA: Diagnosis not present

## 2018-05-10 DIAGNOSIS — Z87440 Personal history of urinary (tract) infections: Secondary | ICD-10-CM | POA: Diagnosis not present

## 2018-05-10 DIAGNOSIS — I10 Essential (primary) hypertension: Secondary | ICD-10-CM | POA: Diagnosis not present

## 2018-05-10 DIAGNOSIS — E1142 Type 2 diabetes mellitus with diabetic polyneuropathy: Secondary | ICD-10-CM | POA: Diagnosis not present

## 2018-05-10 DIAGNOSIS — Z79891 Long term (current) use of opiate analgesic: Secondary | ICD-10-CM | POA: Diagnosis not present

## 2018-05-10 DIAGNOSIS — R600 Localized edema: Secondary | ICD-10-CM | POA: Diagnosis not present

## 2018-05-10 DIAGNOSIS — Z4800 Encounter for change or removal of nonsurgical wound dressing: Secondary | ICD-10-CM | POA: Diagnosis not present

## 2018-05-10 DIAGNOSIS — Z7982 Long term (current) use of aspirin: Secondary | ICD-10-CM | POA: Diagnosis not present

## 2018-05-10 DIAGNOSIS — Z466 Encounter for fitting and adjustment of urinary device: Secondary | ICD-10-CM | POA: Diagnosis not present

## 2018-05-10 DIAGNOSIS — Z794 Long term (current) use of insulin: Secondary | ICD-10-CM | POA: Diagnosis not present

## 2018-05-10 DIAGNOSIS — R339 Retention of urine, unspecified: Secondary | ICD-10-CM | POA: Diagnosis not present

## 2018-05-10 DIAGNOSIS — L89312 Pressure ulcer of right buttock, stage 2: Secondary | ICD-10-CM | POA: Diagnosis not present

## 2018-05-10 DIAGNOSIS — Z9181 History of falling: Secondary | ICD-10-CM | POA: Diagnosis not present

## 2018-05-10 DIAGNOSIS — M5416 Radiculopathy, lumbar region: Secondary | ICD-10-CM | POA: Diagnosis not present

## 2018-05-13 ENCOUNTER — Other Ambulatory Visit (HOSPITAL_COMMUNITY)
Admission: RE | Admit: 2018-05-13 | Discharge: 2018-05-13 | Disposition: A | Discharge status: Home / Self Care | Payer: Medicare Other | Source: Other Acute Inpatient Hospital | Attending: Family Medicine | Admitting: Family Medicine

## 2018-05-13 DIAGNOSIS — E1142 Type 2 diabetes mellitus with diabetic polyneuropathy: Secondary | ICD-10-CM | POA: Diagnosis not present

## 2018-05-13 DIAGNOSIS — Z4800 Encounter for change or removal of nonsurgical wound dressing: Secondary | ICD-10-CM | POA: Diagnosis not present

## 2018-05-13 DIAGNOSIS — L89312 Pressure ulcer of right buttock, stage 2: Secondary | ICD-10-CM | POA: Diagnosis not present

## 2018-05-13 DIAGNOSIS — Z8631 Personal history of diabetic foot ulcer: Secondary | ICD-10-CM | POA: Diagnosis not present

## 2018-05-13 DIAGNOSIS — Z87440 Personal history of urinary (tract) infections: Secondary | ICD-10-CM | POA: Diagnosis not present

## 2018-05-13 DIAGNOSIS — R600 Localized edema: Secondary | ICD-10-CM | POA: Diagnosis not present

## 2018-05-13 DIAGNOSIS — I10 Essential (primary) hypertension: Secondary | ICD-10-CM | POA: Insufficient documentation

## 2018-05-13 DIAGNOSIS — R339 Retention of urine, unspecified: Secondary | ICD-10-CM | POA: Diagnosis not present

## 2018-05-13 DIAGNOSIS — Z9181 History of falling: Secondary | ICD-10-CM | POA: Diagnosis not present

## 2018-05-13 DIAGNOSIS — Z794 Long term (current) use of insulin: Secondary | ICD-10-CM | POA: Diagnosis not present

## 2018-05-13 DIAGNOSIS — Z466 Encounter for fitting and adjustment of urinary device: Secondary | ICD-10-CM | POA: Diagnosis not present

## 2018-05-13 DIAGNOSIS — Z79891 Long term (current) use of opiate analgesic: Secondary | ICD-10-CM | POA: Diagnosis not present

## 2018-05-13 DIAGNOSIS — M5416 Radiculopathy, lumbar region: Secondary | ICD-10-CM | POA: Diagnosis not present

## 2018-05-13 DIAGNOSIS — Z7982 Long term (current) use of aspirin: Secondary | ICD-10-CM | POA: Diagnosis not present

## 2018-05-13 DIAGNOSIS — N319 Neuromuscular dysfunction of bladder, unspecified: Secondary | ICD-10-CM | POA: Diagnosis not present

## 2018-05-13 LAB — COMPREHENSIVE METABOLIC PANEL
ALK PHOS: 64 U/L (ref 38–126)
ALT: 9 U/L (ref 0–44)
ANION GAP: 8 (ref 5–15)
AST: 13 U/L — ABNORMAL LOW (ref 15–41)
Albumin: 3.4 g/dL — ABNORMAL LOW (ref 3.5–5.0)
BUN: 22 mg/dL (ref 8–23)
CALCIUM: 8.9 mg/dL (ref 8.9–10.3)
CO2: 28 mmol/L (ref 22–32)
Chloride: 105 mmol/L (ref 98–111)
Creatinine, Ser: 0.92 mg/dL (ref 0.44–1.00)
GFR calc non Af Amer: >60 mL/min (ref >60)
Glucose, Bld: 114 mg/dL — ABNORMAL HIGH (ref 70–99)
Potassium: 4 mmol/L (ref 3.5–5.1)
Sodium: 141 mmol/L (ref 135–145)
Total Bilirubin: 0.2 mg/dL — ABNORMAL LOW (ref 0.3–1.2)
Total Protein: 6.6 g/dL (ref 6.5–8.1)

## 2018-05-13 LAB — CBC WITH DIFFERENTIAL/PLATELET
Abs Immature Granulocytes: 0.02 10*3/uL (ref 0.00–0.07)
Basophils Absolute: 0 10*3/uL (ref 0.0–0.1)
Basophils Relative: 0 %
EOS ABS: 0.4 10*3/uL (ref 0.0–0.5)
Eosinophils Relative: 5 %
HEMATOCRIT: 36.4 % (ref 36.0–46.0)
HEMOGLOBIN: 10.6 g/dL — AB (ref 12.0–15.0)
Immature Granulocytes: 0 %
LYMPHS ABS: 1.1 10*3/uL (ref 0.7–4.0)
Lymphocytes Relative: 16 %
MCH: 26.4 pg (ref 26.0–34.0)
MCHC: 29.1 g/dL — AB (ref 30.0–36.0)
MCV: 90.5 fL (ref 80.0–100.0)
Monocytes Absolute: 0.7 10*3/uL (ref 0.1–1.0)
Monocytes Relative: 9 %
NEUTROS PCT: 70 %
NRBC: 0 % (ref 0.0–0.2)
Neutro Abs: 5 10*3/uL (ref 1.7–7.7)
Platelets: 177 10*3/uL (ref 150–400)
RBC: 4.02 MIL/uL (ref 3.87–5.11)
RDW: 14 % (ref 11.5–15.5)
WBC: 7.2 10*3/uL (ref 4.0–10.5)

## 2018-05-13 LAB — LIPID PANEL
CHOLESTEROL: 146 mg/dL (ref 0–200)
HDL: 36 mg/dL — AB (ref >40)
LDL Cholesterol: 83 mg/dL (ref 0–99)
Total CHOL/HDL Ratio: 4.1 RATIO
Triglycerides: 136 mg/dL (ref <150)
VLDL: 27 mg/dL (ref 0–40)

## 2018-05-13 LAB — VITAMIN B12: Vitamin B-12: 1809 pg/mL — ABNORMAL HIGH (ref 180–914)

## 2018-05-13 LAB — HEMOGLOBIN A1C
Hgb A1c MFr Bld: 8 % — ABNORMAL HIGH (ref 4.8–5.6)
Mean Plasma Glucose: 182.9 mg/dL

## 2018-05-15 DIAGNOSIS — L89312 Pressure ulcer of right buttock, stage 2: Secondary | ICD-10-CM | POA: Diagnosis not present

## 2018-05-15 DIAGNOSIS — Z466 Encounter for fitting and adjustment of urinary device: Secondary | ICD-10-CM | POA: Diagnosis not present

## 2018-05-15 DIAGNOSIS — Z7982 Long term (current) use of aspirin: Secondary | ICD-10-CM | POA: Diagnosis not present

## 2018-05-15 DIAGNOSIS — Z9181 History of falling: Secondary | ICD-10-CM | POA: Diagnosis not present

## 2018-05-15 DIAGNOSIS — R600 Localized edema: Secondary | ICD-10-CM | POA: Diagnosis not present

## 2018-05-15 DIAGNOSIS — E1142 Type 2 diabetes mellitus with diabetic polyneuropathy: Secondary | ICD-10-CM | POA: Diagnosis not present

## 2018-05-15 DIAGNOSIS — Z4800 Encounter for change or removal of nonsurgical wound dressing: Secondary | ICD-10-CM | POA: Diagnosis not present

## 2018-05-15 DIAGNOSIS — N319 Neuromuscular dysfunction of bladder, unspecified: Secondary | ICD-10-CM | POA: Diagnosis not present

## 2018-05-15 DIAGNOSIS — Z79891 Long term (current) use of opiate analgesic: Secondary | ICD-10-CM | POA: Diagnosis not present

## 2018-05-15 DIAGNOSIS — Z794 Long term (current) use of insulin: Secondary | ICD-10-CM | POA: Diagnosis not present

## 2018-05-15 DIAGNOSIS — R339 Retention of urine, unspecified: Secondary | ICD-10-CM | POA: Diagnosis not present

## 2018-05-15 DIAGNOSIS — I10 Essential (primary) hypertension: Secondary | ICD-10-CM | POA: Diagnosis not present

## 2018-05-15 DIAGNOSIS — M5416 Radiculopathy, lumbar region: Secondary | ICD-10-CM | POA: Diagnosis not present

## 2018-05-15 DIAGNOSIS — Z8631 Personal history of diabetic foot ulcer: Secondary | ICD-10-CM | POA: Diagnosis not present

## 2018-05-15 DIAGNOSIS — Z87440 Personal history of urinary (tract) infections: Secondary | ICD-10-CM | POA: Diagnosis not present

## 2018-05-15 LAB — VITAMIN D 25 HYDROXY (VIT D DEFICIENCY, FRACTURES): Vit D, 25-Hydroxy: 17.5 ng/mL — ABNORMAL LOW (ref 30.0–100.0)

## 2018-05-17 ENCOUNTER — Other Ambulatory Visit (HOSPITAL_COMMUNITY)
Admission: RE | Admit: 2018-05-17 | Discharge: 2018-05-17 | Disposition: A | Discharge status: Home / Self Care | Payer: Medicare Other | Source: Ambulatory Visit | Attending: Family Medicine | Admitting: Family Medicine

## 2018-05-17 DIAGNOSIS — Z4800 Encounter for change or removal of nonsurgical wound dressing: Secondary | ICD-10-CM | POA: Diagnosis not present

## 2018-05-17 DIAGNOSIS — Z87440 Personal history of urinary (tract) infections: Secondary | ICD-10-CM | POA: Diagnosis not present

## 2018-05-17 DIAGNOSIS — R600 Localized edema: Secondary | ICD-10-CM | POA: Diagnosis not present

## 2018-05-17 DIAGNOSIS — Z9181 History of falling: Secondary | ICD-10-CM | POA: Diagnosis not present

## 2018-05-17 DIAGNOSIS — N390 Urinary tract infection, site not specified: Secondary | ICD-10-CM | POA: Insufficient documentation

## 2018-05-17 DIAGNOSIS — Z466 Encounter for fitting and adjustment of urinary device: Secondary | ICD-10-CM | POA: Diagnosis not present

## 2018-05-17 DIAGNOSIS — Z79891 Long term (current) use of opiate analgesic: Secondary | ICD-10-CM | POA: Diagnosis not present

## 2018-05-17 DIAGNOSIS — M5416 Radiculopathy, lumbar region: Secondary | ICD-10-CM | POA: Diagnosis not present

## 2018-05-17 DIAGNOSIS — N319 Neuromuscular dysfunction of bladder, unspecified: Secondary | ICD-10-CM | POA: Diagnosis not present

## 2018-05-17 DIAGNOSIS — I10 Essential (primary) hypertension: Secondary | ICD-10-CM | POA: Diagnosis not present

## 2018-05-17 DIAGNOSIS — Z7982 Long term (current) use of aspirin: Secondary | ICD-10-CM | POA: Diagnosis not present

## 2018-05-17 DIAGNOSIS — Z8631 Personal history of diabetic foot ulcer: Secondary | ICD-10-CM | POA: Diagnosis not present

## 2018-05-17 DIAGNOSIS — E1142 Type 2 diabetes mellitus with diabetic polyneuropathy: Secondary | ICD-10-CM | POA: Diagnosis not present

## 2018-05-17 DIAGNOSIS — Z794 Long term (current) use of insulin: Secondary | ICD-10-CM | POA: Diagnosis not present

## 2018-05-17 DIAGNOSIS — L89312 Pressure ulcer of right buttock, stage 2: Secondary | ICD-10-CM | POA: Diagnosis not present

## 2018-05-17 DIAGNOSIS — R339 Retention of urine, unspecified: Secondary | ICD-10-CM | POA: Diagnosis not present

## 2018-05-17 LAB — URINALYSIS, ROUTINE W REFLEX MICROSCOPIC
Bilirubin Urine: NEGATIVE
Glucose, UA: NEGATIVE mg/dL
Ketones, ur: NEGATIVE mg/dL
Nitrite: NEGATIVE
Protein, ur: 100 mg/dL — AB
RBC / HPF: >50 RBC/hpf — ABNORMAL HIGH (ref 0–5)
Specific Gravity, Urine: 1.019 (ref 1.005–1.030)
WBC, UA: >50 WBC/hpf — ABNORMAL HIGH (ref 0–5)
pH: 5 (ref 5.0–8.0)

## 2018-05-19 DIAGNOSIS — I10 Essential (primary) hypertension: Secondary | ICD-10-CM | POA: Diagnosis not present

## 2018-05-19 DIAGNOSIS — R339 Retention of urine, unspecified: Secondary | ICD-10-CM | POA: Diagnosis not present

## 2018-05-19 DIAGNOSIS — Z7982 Long term (current) use of aspirin: Secondary | ICD-10-CM | POA: Diagnosis not present

## 2018-05-19 DIAGNOSIS — Z794 Long term (current) use of insulin: Secondary | ICD-10-CM | POA: Diagnosis not present

## 2018-05-19 DIAGNOSIS — L89312 Pressure ulcer of right buttock, stage 2: Secondary | ICD-10-CM | POA: Diagnosis not present

## 2018-05-19 DIAGNOSIS — R600 Localized edema: Secondary | ICD-10-CM | POA: Diagnosis not present

## 2018-05-19 DIAGNOSIS — E1142 Type 2 diabetes mellitus with diabetic polyneuropathy: Secondary | ICD-10-CM | POA: Diagnosis not present

## 2018-05-19 DIAGNOSIS — Z79891 Long term (current) use of opiate analgesic: Secondary | ICD-10-CM | POA: Diagnosis not present

## 2018-05-19 DIAGNOSIS — Z87440 Personal history of urinary (tract) infections: Secondary | ICD-10-CM | POA: Diagnosis not present

## 2018-05-19 DIAGNOSIS — Z9181 History of falling: Secondary | ICD-10-CM | POA: Diagnosis not present

## 2018-05-19 DIAGNOSIS — Z4800 Encounter for change or removal of nonsurgical wound dressing: Secondary | ICD-10-CM | POA: Diagnosis not present

## 2018-05-19 DIAGNOSIS — N319 Neuromuscular dysfunction of bladder, unspecified: Secondary | ICD-10-CM | POA: Diagnosis not present

## 2018-05-19 DIAGNOSIS — Z466 Encounter for fitting and adjustment of urinary device: Secondary | ICD-10-CM | POA: Diagnosis not present

## 2018-05-19 DIAGNOSIS — M5416 Radiculopathy, lumbar region: Secondary | ICD-10-CM | POA: Diagnosis not present

## 2018-05-19 DIAGNOSIS — Z8631 Personal history of diabetic foot ulcer: Secondary | ICD-10-CM | POA: Diagnosis not present

## 2018-05-20 LAB — URINE CULTURE: Culture: >=100000 — AB

## 2018-05-22 DIAGNOSIS — Z79891 Long term (current) use of opiate analgesic: Secondary | ICD-10-CM | POA: Diagnosis not present

## 2018-05-22 DIAGNOSIS — Z7982 Long term (current) use of aspirin: Secondary | ICD-10-CM | POA: Diagnosis not present

## 2018-05-22 DIAGNOSIS — Z8631 Personal history of diabetic foot ulcer: Secondary | ICD-10-CM | POA: Diagnosis not present

## 2018-05-22 DIAGNOSIS — L89312 Pressure ulcer of right buttock, stage 2: Secondary | ICD-10-CM | POA: Diagnosis not present

## 2018-05-22 DIAGNOSIS — R600 Localized edema: Secondary | ICD-10-CM | POA: Diagnosis not present

## 2018-05-22 DIAGNOSIS — Z9181 History of falling: Secondary | ICD-10-CM | POA: Diagnosis not present

## 2018-05-22 DIAGNOSIS — N319 Neuromuscular dysfunction of bladder, unspecified: Secondary | ICD-10-CM | POA: Diagnosis not present

## 2018-05-22 DIAGNOSIS — Z794 Long term (current) use of insulin: Secondary | ICD-10-CM | POA: Diagnosis not present

## 2018-05-22 DIAGNOSIS — M5416 Radiculopathy, lumbar region: Secondary | ICD-10-CM | POA: Diagnosis not present

## 2018-05-22 DIAGNOSIS — E1142 Type 2 diabetes mellitus with diabetic polyneuropathy: Secondary | ICD-10-CM | POA: Diagnosis not present

## 2018-05-22 DIAGNOSIS — Z466 Encounter for fitting and adjustment of urinary device: Secondary | ICD-10-CM | POA: Diagnosis not present

## 2018-05-22 DIAGNOSIS — Z4800 Encounter for change or removal of nonsurgical wound dressing: Secondary | ICD-10-CM | POA: Diagnosis not present

## 2018-05-22 DIAGNOSIS — Z87440 Personal history of urinary (tract) infections: Secondary | ICD-10-CM | POA: Diagnosis not present

## 2018-05-22 DIAGNOSIS — I10 Essential (primary) hypertension: Secondary | ICD-10-CM | POA: Diagnosis not present

## 2018-05-22 DIAGNOSIS — R339 Retention of urine, unspecified: Secondary | ICD-10-CM | POA: Diagnosis not present

## 2018-05-24 DIAGNOSIS — N319 Neuromuscular dysfunction of bladder, unspecified: Secondary | ICD-10-CM | POA: Diagnosis not present

## 2018-05-24 DIAGNOSIS — L89312 Pressure ulcer of right buttock, stage 2: Secondary | ICD-10-CM | POA: Diagnosis not present

## 2018-05-24 DIAGNOSIS — R600 Localized edema: Secondary | ICD-10-CM | POA: Diagnosis not present

## 2018-05-24 DIAGNOSIS — Z8631 Personal history of diabetic foot ulcer: Secondary | ICD-10-CM | POA: Diagnosis not present

## 2018-05-24 DIAGNOSIS — E1142 Type 2 diabetes mellitus with diabetic polyneuropathy: Secondary | ICD-10-CM | POA: Diagnosis not present

## 2018-05-24 DIAGNOSIS — M5416 Radiculopathy, lumbar region: Secondary | ICD-10-CM | POA: Diagnosis not present

## 2018-05-24 DIAGNOSIS — Z7982 Long term (current) use of aspirin: Secondary | ICD-10-CM | POA: Diagnosis not present

## 2018-05-24 DIAGNOSIS — Z87440 Personal history of urinary (tract) infections: Secondary | ICD-10-CM | POA: Diagnosis not present

## 2018-05-24 DIAGNOSIS — Z466 Encounter for fitting and adjustment of urinary device: Secondary | ICD-10-CM | POA: Diagnosis not present

## 2018-05-24 DIAGNOSIS — Z9181 History of falling: Secondary | ICD-10-CM | POA: Diagnosis not present

## 2018-05-24 DIAGNOSIS — Z79891 Long term (current) use of opiate analgesic: Secondary | ICD-10-CM | POA: Diagnosis not present

## 2018-05-24 DIAGNOSIS — Z4800 Encounter for change or removal of nonsurgical wound dressing: Secondary | ICD-10-CM | POA: Diagnosis not present

## 2018-05-24 DIAGNOSIS — Z794 Long term (current) use of insulin: Secondary | ICD-10-CM | POA: Diagnosis not present

## 2018-05-24 DIAGNOSIS — I10 Essential (primary) hypertension: Secondary | ICD-10-CM | POA: Diagnosis not present

## 2018-05-24 DIAGNOSIS — R339 Retention of urine, unspecified: Secondary | ICD-10-CM | POA: Diagnosis not present

## 2018-05-26 DIAGNOSIS — Z9181 History of falling: Secondary | ICD-10-CM | POA: Diagnosis not present

## 2018-05-26 DIAGNOSIS — Z79891 Long term (current) use of opiate analgesic: Secondary | ICD-10-CM | POA: Diagnosis not present

## 2018-05-26 DIAGNOSIS — Z466 Encounter for fitting and adjustment of urinary device: Secondary | ICD-10-CM | POA: Diagnosis not present

## 2018-05-26 DIAGNOSIS — M5416 Radiculopathy, lumbar region: Secondary | ICD-10-CM | POA: Diagnosis not present

## 2018-05-26 DIAGNOSIS — Z8631 Personal history of diabetic foot ulcer: Secondary | ICD-10-CM | POA: Diagnosis not present

## 2018-05-26 DIAGNOSIS — R600 Localized edema: Secondary | ICD-10-CM | POA: Diagnosis not present

## 2018-05-26 DIAGNOSIS — E1142 Type 2 diabetes mellitus with diabetic polyneuropathy: Secondary | ICD-10-CM | POA: Diagnosis not present

## 2018-05-26 DIAGNOSIS — Z4800 Encounter for change or removal of nonsurgical wound dressing: Secondary | ICD-10-CM | POA: Diagnosis not present

## 2018-05-26 DIAGNOSIS — Z794 Long term (current) use of insulin: Secondary | ICD-10-CM | POA: Diagnosis not present

## 2018-05-26 DIAGNOSIS — I10 Essential (primary) hypertension: Secondary | ICD-10-CM | POA: Diagnosis not present

## 2018-05-26 DIAGNOSIS — Z7982 Long term (current) use of aspirin: Secondary | ICD-10-CM | POA: Diagnosis not present

## 2018-05-26 DIAGNOSIS — N319 Neuromuscular dysfunction of bladder, unspecified: Secondary | ICD-10-CM | POA: Diagnosis not present

## 2018-05-26 DIAGNOSIS — L89312 Pressure ulcer of right buttock, stage 2: Secondary | ICD-10-CM | POA: Diagnosis not present

## 2018-05-26 DIAGNOSIS — R339 Retention of urine, unspecified: Secondary | ICD-10-CM | POA: Diagnosis not present

## 2018-05-26 DIAGNOSIS — Z87440 Personal history of urinary (tract) infections: Secondary | ICD-10-CM | POA: Diagnosis not present

## 2018-05-28 DIAGNOSIS — B957 Other staphylococcus as the cause of diseases classified elsewhere: Secondary | ICD-10-CM | POA: Diagnosis not present

## 2018-05-28 DIAGNOSIS — E1140 Type 2 diabetes mellitus with diabetic neuropathy, unspecified: Secondary | ICD-10-CM | POA: Diagnosis not present

## 2018-05-28 DIAGNOSIS — I129 Hypertensive chronic kidney disease with stage 1 through stage 4 chronic kidney disease, or unspecified chronic kidney disease: Secondary | ICD-10-CM | POA: Diagnosis not present

## 2018-05-28 DIAGNOSIS — N200 Calculus of kidney: Secondary | ICD-10-CM | POA: Diagnosis not present

## 2018-05-28 DIAGNOSIS — B952 Enterococcus as the cause of diseases classified elsewhere: Secondary | ICD-10-CM | POA: Diagnosis not present

## 2018-05-28 DIAGNOSIS — R531 Weakness: Secondary | ICD-10-CM | POA: Diagnosis not present

## 2018-05-28 DIAGNOSIS — B958 Unspecified staphylococcus as the cause of diseases classified elsewhere: Secondary | ICD-10-CM | POA: Diagnosis not present

## 2018-05-28 DIAGNOSIS — R5381 Other malaise: Secondary | ICD-10-CM | POA: Diagnosis not present

## 2018-05-28 DIAGNOSIS — E1142 Type 2 diabetes mellitus with diabetic polyneuropathy: Secondary | ICD-10-CM | POA: Diagnosis not present

## 2018-05-28 DIAGNOSIS — N189 Chronic kidney disease, unspecified: Secondary | ICD-10-CM | POA: Diagnosis not present

## 2018-05-28 DIAGNOSIS — N139 Obstructive and reflux uropathy, unspecified: Secondary | ICD-10-CM | POA: Diagnosis not present

## 2018-05-28 DIAGNOSIS — Z794 Long term (current) use of insulin: Secondary | ICD-10-CM | POA: Diagnosis not present

## 2018-05-28 DIAGNOSIS — Z452 Encounter for adjustment and management of vascular access device: Secondary | ICD-10-CM | POA: Diagnosis not present

## 2018-05-28 DIAGNOSIS — Z8673 Personal history of transient ischemic attack (TIA), and cerebral infarction without residual deficits: Secondary | ICD-10-CM | POA: Diagnosis not present

## 2018-05-28 DIAGNOSIS — N319 Neuromuscular dysfunction of bladder, unspecified: Secondary | ICD-10-CM | POA: Diagnosis not present

## 2018-05-28 DIAGNOSIS — E1165 Type 2 diabetes mellitus with hyperglycemia: Secondary | ICD-10-CM | POA: Diagnosis not present

## 2018-05-28 DIAGNOSIS — G9009 Other idiopathic peripheral autonomic neuropathy: Secondary | ICD-10-CM | POA: Diagnosis not present

## 2018-05-28 DIAGNOSIS — K5900 Constipation, unspecified: Secondary | ICD-10-CM | POA: Diagnosis not present

## 2018-05-28 DIAGNOSIS — N390 Urinary tract infection, site not specified: Secondary | ICD-10-CM | POA: Diagnosis not present

## 2018-05-28 DIAGNOSIS — E1129 Type 2 diabetes mellitus with other diabetic kidney complication: Secondary | ICD-10-CM | POA: Diagnosis not present

## 2018-05-28 DIAGNOSIS — M6281 Muscle weakness (generalized): Secondary | ICD-10-CM | POA: Diagnosis not present

## 2018-05-28 DIAGNOSIS — E875 Hyperkalemia: Secondary | ICD-10-CM | POA: Diagnosis not present

## 2018-05-28 DIAGNOSIS — Z87440 Personal history of urinary (tract) infections: Secondary | ICD-10-CM | POA: Diagnosis not present

## 2018-05-28 DIAGNOSIS — R001 Bradycardia, unspecified: Secondary | ICD-10-CM | POA: Diagnosis not present

## 2018-05-28 DIAGNOSIS — T83511A Infection and inflammatory reaction due to indwelling urethral catheter, initial encounter: Secondary | ICD-10-CM | POA: Diagnosis not present

## 2018-05-28 DIAGNOSIS — E871 Hypo-osmolality and hyponatremia: Secondary | ICD-10-CM | POA: Diagnosis not present

## 2018-05-28 DIAGNOSIS — R2689 Other abnormalities of gait and mobility: Secondary | ICD-10-CM | POA: Diagnosis not present

## 2018-05-28 DIAGNOSIS — E860 Dehydration: Secondary | ICD-10-CM | POA: Diagnosis not present

## 2018-05-28 DIAGNOSIS — E1122 Type 2 diabetes mellitus with diabetic chronic kidney disease: Secondary | ICD-10-CM | POA: Diagnosis not present

## 2018-05-28 DIAGNOSIS — Z7982 Long term (current) use of aspirin: Secondary | ICD-10-CM | POA: Diagnosis not present

## 2018-05-28 DIAGNOSIS — T83511D Infection and inflammatory reaction due to indwelling urethral catheter, subsequent encounter: Secondary | ICD-10-CM | POA: Diagnosis not present

## 2018-05-28 DIAGNOSIS — N179 Acute kidney failure, unspecified: Secondary | ICD-10-CM | POA: Diagnosis not present

## 2018-05-28 DIAGNOSIS — Z87891 Personal history of nicotine dependence: Secondary | ICD-10-CM | POA: Diagnosis not present

## 2018-06-02 DIAGNOSIS — E1122 Type 2 diabetes mellitus with diabetic chronic kidney disease: Secondary | ICD-10-CM | POA: Diagnosis not present

## 2018-06-02 DIAGNOSIS — R202 Paresthesia of skin: Secondary | ICD-10-CM | POA: Diagnosis not present

## 2018-06-02 DIAGNOSIS — M6281 Muscle weakness (generalized): Secondary | ICD-10-CM | POA: Diagnosis not present

## 2018-06-02 DIAGNOSIS — N179 Acute kidney failure, unspecified: Secondary | ICD-10-CM | POA: Diagnosis not present

## 2018-06-02 DIAGNOSIS — E1140 Type 2 diabetes mellitus with diabetic neuropathy, unspecified: Secondary | ICD-10-CM | POA: Diagnosis not present

## 2018-06-02 DIAGNOSIS — R2689 Other abnormalities of gait and mobility: Secondary | ICD-10-CM | POA: Diagnosis not present

## 2018-06-02 DIAGNOSIS — N139 Obstructive and reflux uropathy, unspecified: Secondary | ICD-10-CM | POA: Diagnosis not present

## 2018-06-02 DIAGNOSIS — R200 Anesthesia of skin: Secondary | ICD-10-CM | POA: Diagnosis not present

## 2018-06-02 DIAGNOSIS — G459 Transient cerebral ischemic attack, unspecified: Secondary | ICD-10-CM | POA: Diagnosis not present

## 2018-06-02 DIAGNOSIS — N390 Urinary tract infection, site not specified: Secondary | ICD-10-CM | POA: Diagnosis not present

## 2018-06-02 DIAGNOSIS — E1142 Type 2 diabetes mellitus with diabetic polyneuropathy: Secondary | ICD-10-CM | POA: Diagnosis not present

## 2018-06-02 DIAGNOSIS — E875 Hyperkalemia: Secondary | ICD-10-CM | POA: Diagnosis not present

## 2018-06-02 DIAGNOSIS — T83511D Infection and inflammatory reaction due to indwelling urethral catheter, subsequent encounter: Secondary | ICD-10-CM | POA: Diagnosis not present

## 2018-06-02 DIAGNOSIS — Z452 Encounter for adjustment and management of vascular access device: Secondary | ICD-10-CM | POA: Diagnosis not present

## 2018-06-02 DIAGNOSIS — N319 Neuromuscular dysfunction of bladder, unspecified: Secondary | ICD-10-CM | POA: Diagnosis not present

## 2018-06-02 DIAGNOSIS — B958 Unspecified staphylococcus as the cause of diseases classified elsewhere: Secondary | ICD-10-CM | POA: Diagnosis not present

## 2018-06-20 DIAGNOSIS — R200 Anesthesia of skin: Secondary | ICD-10-CM | POA: Diagnosis not present

## 2018-06-20 DIAGNOSIS — R202 Paresthesia of skin: Secondary | ICD-10-CM | POA: Diagnosis not present

## 2018-06-20 DIAGNOSIS — G459 Transient cerebral ischemic attack, unspecified: Secondary | ICD-10-CM | POA: Diagnosis not present

## 2018-06-23 DIAGNOSIS — Z4800 Encounter for change or removal of nonsurgical wound dressing: Secondary | ICD-10-CM | POA: Diagnosis not present

## 2018-06-23 DIAGNOSIS — Z79891 Long term (current) use of opiate analgesic: Secondary | ICD-10-CM | POA: Diagnosis not present

## 2018-06-23 DIAGNOSIS — R600 Localized edema: Secondary | ICD-10-CM | POA: Diagnosis not present

## 2018-06-23 DIAGNOSIS — Z87440 Personal history of urinary (tract) infections: Secondary | ICD-10-CM | POA: Diagnosis not present

## 2018-06-23 DIAGNOSIS — E1142 Type 2 diabetes mellitus with diabetic polyneuropathy: Secondary | ICD-10-CM | POA: Diagnosis not present

## 2018-06-23 DIAGNOSIS — Z7982 Long term (current) use of aspirin: Secondary | ICD-10-CM | POA: Diagnosis not present

## 2018-06-23 DIAGNOSIS — Z9181 History of falling: Secondary | ICD-10-CM | POA: Diagnosis not present

## 2018-06-23 DIAGNOSIS — M5416 Radiculopathy, lumbar region: Secondary | ICD-10-CM | POA: Diagnosis not present

## 2018-06-23 DIAGNOSIS — Z794 Long term (current) use of insulin: Secondary | ICD-10-CM | POA: Diagnosis not present

## 2018-06-23 DIAGNOSIS — R339 Retention of urine, unspecified: Secondary | ICD-10-CM | POA: Diagnosis not present

## 2018-06-23 DIAGNOSIS — I6350 Cerebral infarction due to unspecified occlusion or stenosis of unspecified cerebral artery: Secondary | ICD-10-CM | POA: Diagnosis not present

## 2018-06-23 DIAGNOSIS — Z8631 Personal history of diabetic foot ulcer: Secondary | ICD-10-CM | POA: Diagnosis not present

## 2018-06-23 DIAGNOSIS — Z466 Encounter for fitting and adjustment of urinary device: Secondary | ICD-10-CM | POA: Diagnosis not present

## 2018-06-23 DIAGNOSIS — I10 Essential (primary) hypertension: Secondary | ICD-10-CM | POA: Diagnosis not present

## 2018-06-23 DIAGNOSIS — L89312 Pressure ulcer of right buttock, stage 2: Secondary | ICD-10-CM | POA: Diagnosis not present

## 2018-06-23 DIAGNOSIS — N319 Neuromuscular dysfunction of bladder, unspecified: Secondary | ICD-10-CM | POA: Diagnosis not present

## 2018-06-24 DIAGNOSIS — N319 Neuromuscular dysfunction of bladder, unspecified: Secondary | ICD-10-CM | POA: Diagnosis not present

## 2018-06-24 DIAGNOSIS — Z79891 Long term (current) use of opiate analgesic: Secondary | ICD-10-CM | POA: Diagnosis not present

## 2018-06-24 DIAGNOSIS — L89312 Pressure ulcer of right buttock, stage 2: Secondary | ICD-10-CM | POA: Diagnosis not present

## 2018-06-24 DIAGNOSIS — Z87440 Personal history of urinary (tract) infections: Secondary | ICD-10-CM | POA: Diagnosis not present

## 2018-06-24 DIAGNOSIS — Z794 Long term (current) use of insulin: Secondary | ICD-10-CM | POA: Diagnosis not present

## 2018-06-24 DIAGNOSIS — Z4800 Encounter for change or removal of nonsurgical wound dressing: Secondary | ICD-10-CM | POA: Diagnosis not present

## 2018-06-24 DIAGNOSIS — Z466 Encounter for fitting and adjustment of urinary device: Secondary | ICD-10-CM | POA: Diagnosis not present

## 2018-06-24 DIAGNOSIS — Z7982 Long term (current) use of aspirin: Secondary | ICD-10-CM | POA: Diagnosis not present

## 2018-06-24 DIAGNOSIS — E1142 Type 2 diabetes mellitus with diabetic polyneuropathy: Secondary | ICD-10-CM | POA: Diagnosis not present

## 2018-06-24 DIAGNOSIS — Z9181 History of falling: Secondary | ICD-10-CM | POA: Diagnosis not present

## 2018-06-24 DIAGNOSIS — M5416 Radiculopathy, lumbar region: Secondary | ICD-10-CM | POA: Diagnosis not present

## 2018-06-24 DIAGNOSIS — Z8631 Personal history of diabetic foot ulcer: Secondary | ICD-10-CM | POA: Diagnosis not present

## 2018-06-24 DIAGNOSIS — R600 Localized edema: Secondary | ICD-10-CM | POA: Diagnosis not present

## 2018-06-24 DIAGNOSIS — R339 Retention of urine, unspecified: Secondary | ICD-10-CM | POA: Diagnosis not present

## 2018-06-24 DIAGNOSIS — I10 Essential (primary) hypertension: Secondary | ICD-10-CM | POA: Diagnosis not present

## 2018-06-26 DIAGNOSIS — Z87440 Personal history of urinary (tract) infections: Secondary | ICD-10-CM | POA: Diagnosis not present

## 2018-06-26 DIAGNOSIS — Z794 Long term (current) use of insulin: Secondary | ICD-10-CM | POA: Diagnosis not present

## 2018-06-26 DIAGNOSIS — Z8631 Personal history of diabetic foot ulcer: Secondary | ICD-10-CM | POA: Diagnosis not present

## 2018-06-26 DIAGNOSIS — Z79891 Long term (current) use of opiate analgesic: Secondary | ICD-10-CM | POA: Diagnosis not present

## 2018-06-26 DIAGNOSIS — Z7982 Long term (current) use of aspirin: Secondary | ICD-10-CM | POA: Diagnosis not present

## 2018-06-26 DIAGNOSIS — R339 Retention of urine, unspecified: Secondary | ICD-10-CM | POA: Diagnosis not present

## 2018-06-26 DIAGNOSIS — N319 Neuromuscular dysfunction of bladder, unspecified: Secondary | ICD-10-CM | POA: Diagnosis not present

## 2018-06-26 DIAGNOSIS — E1142 Type 2 diabetes mellitus with diabetic polyneuropathy: Secondary | ICD-10-CM | POA: Diagnosis not present

## 2018-06-26 DIAGNOSIS — Z466 Encounter for fitting and adjustment of urinary device: Secondary | ICD-10-CM | POA: Diagnosis not present

## 2018-06-26 DIAGNOSIS — Z9181 History of falling: Secondary | ICD-10-CM | POA: Diagnosis not present

## 2018-06-26 DIAGNOSIS — Z4800 Encounter for change or removal of nonsurgical wound dressing: Secondary | ICD-10-CM | POA: Diagnosis not present

## 2018-06-26 DIAGNOSIS — I10 Essential (primary) hypertension: Secondary | ICD-10-CM | POA: Diagnosis not present

## 2018-06-26 DIAGNOSIS — L89312 Pressure ulcer of right buttock, stage 2: Secondary | ICD-10-CM | POA: Diagnosis not present

## 2018-06-26 DIAGNOSIS — M5416 Radiculopathy, lumbar region: Secondary | ICD-10-CM | POA: Diagnosis not present

## 2018-06-26 DIAGNOSIS — R600 Localized edema: Secondary | ICD-10-CM | POA: Diagnosis not present

## 2018-06-27 DIAGNOSIS — R600 Localized edema: Secondary | ICD-10-CM | POA: Diagnosis not present

## 2018-06-27 DIAGNOSIS — L89312 Pressure ulcer of right buttock, stage 2: Secondary | ICD-10-CM | POA: Diagnosis not present

## 2018-06-27 DIAGNOSIS — N319 Neuromuscular dysfunction of bladder, unspecified: Secondary | ICD-10-CM | POA: Diagnosis not present

## 2018-06-27 DIAGNOSIS — Z4800 Encounter for change or removal of nonsurgical wound dressing: Secondary | ICD-10-CM | POA: Diagnosis not present

## 2018-06-27 DIAGNOSIS — Z87440 Personal history of urinary (tract) infections: Secondary | ICD-10-CM | POA: Diagnosis not present

## 2018-06-27 DIAGNOSIS — E1142 Type 2 diabetes mellitus with diabetic polyneuropathy: Secondary | ICD-10-CM | POA: Diagnosis not present

## 2018-06-27 DIAGNOSIS — Z466 Encounter for fitting and adjustment of urinary device: Secondary | ICD-10-CM | POA: Diagnosis not present

## 2018-06-27 DIAGNOSIS — Z9181 History of falling: Secondary | ICD-10-CM | POA: Diagnosis not present

## 2018-06-27 DIAGNOSIS — Z7982 Long term (current) use of aspirin: Secondary | ICD-10-CM | POA: Diagnosis not present

## 2018-06-27 DIAGNOSIS — Z79891 Long term (current) use of opiate analgesic: Secondary | ICD-10-CM | POA: Diagnosis not present

## 2018-06-27 DIAGNOSIS — I10 Essential (primary) hypertension: Secondary | ICD-10-CM | POA: Diagnosis not present

## 2018-06-27 DIAGNOSIS — Z8631 Personal history of diabetic foot ulcer: Secondary | ICD-10-CM | POA: Diagnosis not present

## 2018-06-27 DIAGNOSIS — M5416 Radiculopathy, lumbar region: Secondary | ICD-10-CM | POA: Diagnosis not present

## 2018-06-27 DIAGNOSIS — R339 Retention of urine, unspecified: Secondary | ICD-10-CM | POA: Diagnosis not present

## 2018-06-27 DIAGNOSIS — Z794 Long term (current) use of insulin: Secondary | ICD-10-CM | POA: Diagnosis not present

## 2018-06-28 DIAGNOSIS — Z7982 Long term (current) use of aspirin: Secondary | ICD-10-CM | POA: Diagnosis not present

## 2018-06-28 DIAGNOSIS — R339 Retention of urine, unspecified: Secondary | ICD-10-CM | POA: Diagnosis not present

## 2018-06-28 DIAGNOSIS — E11621 Type 2 diabetes mellitus with foot ulcer: Secondary | ICD-10-CM | POA: Diagnosis not present

## 2018-06-28 DIAGNOSIS — M545 Low back pain: Secondary | ICD-10-CM | POA: Diagnosis not present

## 2018-06-28 DIAGNOSIS — R600 Localized edema: Secondary | ICD-10-CM | POA: Diagnosis not present

## 2018-06-28 DIAGNOSIS — Z87440 Personal history of urinary (tract) infections: Secondary | ICD-10-CM | POA: Diagnosis not present

## 2018-06-28 DIAGNOSIS — Z9181 History of falling: Secondary | ICD-10-CM | POA: Diagnosis not present

## 2018-06-28 DIAGNOSIS — Z794 Long term (current) use of insulin: Secondary | ICD-10-CM | POA: Diagnosis not present

## 2018-06-28 DIAGNOSIS — Z4800 Encounter for change or removal of nonsurgical wound dressing: Secondary | ICD-10-CM | POA: Diagnosis not present

## 2018-06-28 DIAGNOSIS — Z466 Encounter for fitting and adjustment of urinary device: Secondary | ICD-10-CM | POA: Diagnosis not present

## 2018-06-28 DIAGNOSIS — Z8631 Personal history of diabetic foot ulcer: Secondary | ICD-10-CM | POA: Diagnosis not present

## 2018-06-28 DIAGNOSIS — M5416 Radiculopathy, lumbar region: Secondary | ICD-10-CM | POA: Diagnosis not present

## 2018-06-28 DIAGNOSIS — I10 Essential (primary) hypertension: Secondary | ICD-10-CM | POA: Diagnosis not present

## 2018-06-28 DIAGNOSIS — N319 Neuromuscular dysfunction of bladder, unspecified: Secondary | ICD-10-CM | POA: Diagnosis not present

## 2018-06-28 DIAGNOSIS — R609 Edema, unspecified: Secondary | ICD-10-CM | POA: Diagnosis not present

## 2018-06-28 DIAGNOSIS — E1142 Type 2 diabetes mellitus with diabetic polyneuropathy: Secondary | ICD-10-CM | POA: Diagnosis not present

## 2018-06-28 DIAGNOSIS — L89312 Pressure ulcer of right buttock, stage 2: Secondary | ICD-10-CM | POA: Diagnosis not present

## 2018-06-28 DIAGNOSIS — Z79891 Long term (current) use of opiate analgesic: Secondary | ICD-10-CM | POA: Diagnosis not present

## 2018-06-29 DIAGNOSIS — Z7982 Long term (current) use of aspirin: Secondary | ICD-10-CM | POA: Diagnosis not present

## 2018-06-29 DIAGNOSIS — Z9181 History of falling: Secondary | ICD-10-CM | POA: Diagnosis not present

## 2018-06-29 DIAGNOSIS — E1142 Type 2 diabetes mellitus with diabetic polyneuropathy: Secondary | ICD-10-CM | POA: Diagnosis not present

## 2018-06-29 DIAGNOSIS — Z466 Encounter for fitting and adjustment of urinary device: Secondary | ICD-10-CM | POA: Diagnosis not present

## 2018-06-29 DIAGNOSIS — M5416 Radiculopathy, lumbar region: Secondary | ICD-10-CM | POA: Diagnosis not present

## 2018-06-29 DIAGNOSIS — Z794 Long term (current) use of insulin: Secondary | ICD-10-CM | POA: Diagnosis not present

## 2018-06-29 DIAGNOSIS — Z8631 Personal history of diabetic foot ulcer: Secondary | ICD-10-CM | POA: Diagnosis not present

## 2018-06-29 DIAGNOSIS — Z79891 Long term (current) use of opiate analgesic: Secondary | ICD-10-CM | POA: Diagnosis not present

## 2018-06-29 DIAGNOSIS — L89312 Pressure ulcer of right buttock, stage 2: Secondary | ICD-10-CM | POA: Diagnosis not present

## 2018-06-29 DIAGNOSIS — Z4800 Encounter for change or removal of nonsurgical wound dressing: Secondary | ICD-10-CM | POA: Diagnosis not present

## 2018-06-29 DIAGNOSIS — Z87440 Personal history of urinary (tract) infections: Secondary | ICD-10-CM | POA: Diagnosis not present

## 2018-06-29 DIAGNOSIS — N319 Neuromuscular dysfunction of bladder, unspecified: Secondary | ICD-10-CM | POA: Diagnosis not present

## 2018-06-29 DIAGNOSIS — R339 Retention of urine, unspecified: Secondary | ICD-10-CM | POA: Diagnosis not present

## 2018-06-29 DIAGNOSIS — I10 Essential (primary) hypertension: Secondary | ICD-10-CM | POA: Diagnosis not present

## 2018-06-29 DIAGNOSIS — R600 Localized edema: Secondary | ICD-10-CM | POA: Diagnosis not present

## 2018-06-30 DIAGNOSIS — I10 Essential (primary) hypertension: Secondary | ICD-10-CM | POA: Diagnosis not present

## 2018-06-30 DIAGNOSIS — Z87440 Personal history of urinary (tract) infections: Secondary | ICD-10-CM | POA: Diagnosis not present

## 2018-06-30 DIAGNOSIS — Z4800 Encounter for change or removal of nonsurgical wound dressing: Secondary | ICD-10-CM | POA: Diagnosis not present

## 2018-06-30 DIAGNOSIS — Z466 Encounter for fitting and adjustment of urinary device: Secondary | ICD-10-CM | POA: Diagnosis not present

## 2018-06-30 DIAGNOSIS — N319 Neuromuscular dysfunction of bladder, unspecified: Secondary | ICD-10-CM | POA: Diagnosis not present

## 2018-06-30 DIAGNOSIS — E1142 Type 2 diabetes mellitus with diabetic polyneuropathy: Secondary | ICD-10-CM | POA: Diagnosis not present

## 2018-06-30 DIAGNOSIS — Z8631 Personal history of diabetic foot ulcer: Secondary | ICD-10-CM | POA: Diagnosis not present

## 2018-06-30 DIAGNOSIS — Z9181 History of falling: Secondary | ICD-10-CM | POA: Diagnosis not present

## 2018-06-30 DIAGNOSIS — Z794 Long term (current) use of insulin: Secondary | ICD-10-CM | POA: Diagnosis not present

## 2018-06-30 DIAGNOSIS — R600 Localized edema: Secondary | ICD-10-CM | POA: Diagnosis not present

## 2018-06-30 DIAGNOSIS — M5416 Radiculopathy, lumbar region: Secondary | ICD-10-CM | POA: Diagnosis not present

## 2018-06-30 DIAGNOSIS — R339 Retention of urine, unspecified: Secondary | ICD-10-CM | POA: Diagnosis not present

## 2018-06-30 DIAGNOSIS — Z79891 Long term (current) use of opiate analgesic: Secondary | ICD-10-CM | POA: Diagnosis not present

## 2018-06-30 DIAGNOSIS — Z7982 Long term (current) use of aspirin: Secondary | ICD-10-CM | POA: Diagnosis not present

## 2018-06-30 DIAGNOSIS — L89312 Pressure ulcer of right buttock, stage 2: Secondary | ICD-10-CM | POA: Diagnosis not present

## 2018-07-03 DIAGNOSIS — Z794 Long term (current) use of insulin: Secondary | ICD-10-CM | POA: Diagnosis not present

## 2018-07-03 DIAGNOSIS — Z87440 Personal history of urinary (tract) infections: Secondary | ICD-10-CM | POA: Diagnosis not present

## 2018-07-03 DIAGNOSIS — Z8631 Personal history of diabetic foot ulcer: Secondary | ICD-10-CM | POA: Diagnosis not present

## 2018-07-03 DIAGNOSIS — L89312 Pressure ulcer of right buttock, stage 2: Secondary | ICD-10-CM | POA: Diagnosis not present

## 2018-07-03 DIAGNOSIS — R600 Localized edema: Secondary | ICD-10-CM | POA: Diagnosis not present

## 2018-07-03 DIAGNOSIS — Z466 Encounter for fitting and adjustment of urinary device: Secondary | ICD-10-CM | POA: Diagnosis not present

## 2018-07-03 DIAGNOSIS — Z7982 Long term (current) use of aspirin: Secondary | ICD-10-CM | POA: Diagnosis not present

## 2018-07-03 DIAGNOSIS — M5416 Radiculopathy, lumbar region: Secondary | ICD-10-CM | POA: Diagnosis not present

## 2018-07-03 DIAGNOSIS — Z9181 History of falling: Secondary | ICD-10-CM | POA: Diagnosis not present

## 2018-07-03 DIAGNOSIS — N319 Neuromuscular dysfunction of bladder, unspecified: Secondary | ICD-10-CM | POA: Diagnosis not present

## 2018-07-03 DIAGNOSIS — Z79891 Long term (current) use of opiate analgesic: Secondary | ICD-10-CM | POA: Diagnosis not present

## 2018-07-03 DIAGNOSIS — Z4800 Encounter for change or removal of nonsurgical wound dressing: Secondary | ICD-10-CM | POA: Diagnosis not present

## 2018-07-03 DIAGNOSIS — R339 Retention of urine, unspecified: Secondary | ICD-10-CM | POA: Diagnosis not present

## 2018-07-03 DIAGNOSIS — I10 Essential (primary) hypertension: Secondary | ICD-10-CM | POA: Diagnosis not present

## 2018-07-03 DIAGNOSIS — E1142 Type 2 diabetes mellitus with diabetic polyneuropathy: Secondary | ICD-10-CM | POA: Diagnosis not present

## 2018-07-05 DIAGNOSIS — Z87440 Personal history of urinary (tract) infections: Secondary | ICD-10-CM | POA: Diagnosis not present

## 2018-07-05 DIAGNOSIS — Z79891 Long term (current) use of opiate analgesic: Secondary | ICD-10-CM | POA: Diagnosis not present

## 2018-07-05 DIAGNOSIS — R238 Other skin changes: Secondary | ICD-10-CM | POA: Diagnosis not present

## 2018-07-05 DIAGNOSIS — Z4800 Encounter for change or removal of nonsurgical wound dressing: Secondary | ICD-10-CM | POA: Diagnosis not present

## 2018-07-05 DIAGNOSIS — Z7982 Long term (current) use of aspirin: Secondary | ICD-10-CM | POA: Diagnosis not present

## 2018-07-05 DIAGNOSIS — E1142 Type 2 diabetes mellitus with diabetic polyneuropathy: Secondary | ICD-10-CM | POA: Diagnosis not present

## 2018-07-05 DIAGNOSIS — I10 Essential (primary) hypertension: Secondary | ICD-10-CM | POA: Diagnosis not present

## 2018-07-05 DIAGNOSIS — M5416 Radiculopathy, lumbar region: Secondary | ICD-10-CM | POA: Diagnosis not present

## 2018-07-05 DIAGNOSIS — Z9181 History of falling: Secondary | ICD-10-CM | POA: Diagnosis not present

## 2018-07-05 DIAGNOSIS — Z794 Long term (current) use of insulin: Secondary | ICD-10-CM | POA: Diagnosis not present

## 2018-07-05 DIAGNOSIS — N319 Neuromuscular dysfunction of bladder, unspecified: Secondary | ICD-10-CM | POA: Diagnosis not present

## 2018-07-05 DIAGNOSIS — Z8631 Personal history of diabetic foot ulcer: Secondary | ICD-10-CM | POA: Diagnosis not present

## 2018-07-05 DIAGNOSIS — Z466 Encounter for fitting and adjustment of urinary device: Secondary | ICD-10-CM | POA: Diagnosis not present

## 2018-07-05 DIAGNOSIS — L89312 Pressure ulcer of right buttock, stage 2: Secondary | ICD-10-CM | POA: Diagnosis not present

## 2018-07-05 DIAGNOSIS — N139 Obstructive and reflux uropathy, unspecified: Secondary | ICD-10-CM | POA: Diagnosis not present

## 2018-07-05 DIAGNOSIS — R338 Other retention of urine: Secondary | ICD-10-CM | POA: Diagnosis not present

## 2018-07-06 DIAGNOSIS — Z4800 Encounter for change or removal of nonsurgical wound dressing: Secondary | ICD-10-CM | POA: Diagnosis not present

## 2018-07-06 DIAGNOSIS — E1142 Type 2 diabetes mellitus with diabetic polyneuropathy: Secondary | ICD-10-CM | POA: Diagnosis not present

## 2018-07-06 DIAGNOSIS — N319 Neuromuscular dysfunction of bladder, unspecified: Secondary | ICD-10-CM | POA: Diagnosis not present

## 2018-07-06 DIAGNOSIS — Z7982 Long term (current) use of aspirin: Secondary | ICD-10-CM | POA: Diagnosis not present

## 2018-07-06 DIAGNOSIS — Z9181 History of falling: Secondary | ICD-10-CM | POA: Diagnosis not present

## 2018-07-06 DIAGNOSIS — I10 Essential (primary) hypertension: Secondary | ICD-10-CM | POA: Diagnosis not present

## 2018-07-06 DIAGNOSIS — R238 Other skin changes: Secondary | ICD-10-CM | POA: Diagnosis not present

## 2018-07-06 DIAGNOSIS — Z79891 Long term (current) use of opiate analgesic: Secondary | ICD-10-CM | POA: Diagnosis not present

## 2018-07-06 DIAGNOSIS — Z8631 Personal history of diabetic foot ulcer: Secondary | ICD-10-CM | POA: Diagnosis not present

## 2018-07-06 DIAGNOSIS — N139 Obstructive and reflux uropathy, unspecified: Secondary | ICD-10-CM | POA: Diagnosis not present

## 2018-07-06 DIAGNOSIS — M5416 Radiculopathy, lumbar region: Secondary | ICD-10-CM | POA: Diagnosis not present

## 2018-07-06 DIAGNOSIS — L89312 Pressure ulcer of right buttock, stage 2: Secondary | ICD-10-CM | POA: Diagnosis not present

## 2018-07-06 DIAGNOSIS — Z794 Long term (current) use of insulin: Secondary | ICD-10-CM | POA: Diagnosis not present

## 2018-07-06 DIAGNOSIS — Z466 Encounter for fitting and adjustment of urinary device: Secondary | ICD-10-CM | POA: Diagnosis not present

## 2018-07-06 DIAGNOSIS — R338 Other retention of urine: Secondary | ICD-10-CM | POA: Diagnosis not present

## 2018-07-06 DIAGNOSIS — Z87440 Personal history of urinary (tract) infections: Secondary | ICD-10-CM | POA: Diagnosis not present

## 2018-07-07 DIAGNOSIS — Z9181 History of falling: Secondary | ICD-10-CM | POA: Diagnosis not present

## 2018-07-07 DIAGNOSIS — R238 Other skin changes: Secondary | ICD-10-CM | POA: Diagnosis not present

## 2018-07-07 DIAGNOSIS — L89312 Pressure ulcer of right buttock, stage 2: Secondary | ICD-10-CM | POA: Diagnosis not present

## 2018-07-07 DIAGNOSIS — E1142 Type 2 diabetes mellitus with diabetic polyneuropathy: Secondary | ICD-10-CM | POA: Diagnosis not present

## 2018-07-07 DIAGNOSIS — N319 Neuromuscular dysfunction of bladder, unspecified: Secondary | ICD-10-CM | POA: Diagnosis not present

## 2018-07-07 DIAGNOSIS — N139 Obstructive and reflux uropathy, unspecified: Secondary | ICD-10-CM | POA: Diagnosis not present

## 2018-07-07 DIAGNOSIS — I10 Essential (primary) hypertension: Secondary | ICD-10-CM | POA: Diagnosis not present

## 2018-07-07 DIAGNOSIS — Z794 Long term (current) use of insulin: Secondary | ICD-10-CM | POA: Diagnosis not present

## 2018-07-07 DIAGNOSIS — Z8631 Personal history of diabetic foot ulcer: Secondary | ICD-10-CM | POA: Diagnosis not present

## 2018-07-07 DIAGNOSIS — Z87440 Personal history of urinary (tract) infections: Secondary | ICD-10-CM | POA: Diagnosis not present

## 2018-07-07 DIAGNOSIS — Z4800 Encounter for change or removal of nonsurgical wound dressing: Secondary | ICD-10-CM | POA: Diagnosis not present

## 2018-07-07 DIAGNOSIS — Z466 Encounter for fitting and adjustment of urinary device: Secondary | ICD-10-CM | POA: Diagnosis not present

## 2018-07-07 DIAGNOSIS — Z79891 Long term (current) use of opiate analgesic: Secondary | ICD-10-CM | POA: Diagnosis not present

## 2018-07-07 DIAGNOSIS — M5416 Radiculopathy, lumbar region: Secondary | ICD-10-CM | POA: Diagnosis not present

## 2018-07-07 DIAGNOSIS — Z7982 Long term (current) use of aspirin: Secondary | ICD-10-CM | POA: Diagnosis not present

## 2018-07-07 DIAGNOSIS — R338 Other retention of urine: Secondary | ICD-10-CM | POA: Diagnosis not present

## 2018-07-11 ENCOUNTER — Other Ambulatory Visit (HOSPITAL_COMMUNITY)
Admission: RE | Admit: 2018-07-11 | Discharge: 2018-07-11 | Disposition: A | Discharge status: Home / Self Care | Payer: Medicare Other | Source: Ambulatory Visit | Attending: Family Medicine | Admitting: Family Medicine

## 2018-07-11 DIAGNOSIS — Z794 Long term (current) use of insulin: Secondary | ICD-10-CM | POA: Diagnosis not present

## 2018-07-11 DIAGNOSIS — Z79891 Long term (current) use of opiate analgesic: Secondary | ICD-10-CM | POA: Diagnosis not present

## 2018-07-11 DIAGNOSIS — N139 Obstructive and reflux uropathy, unspecified: Secondary | ICD-10-CM | POA: Diagnosis not present

## 2018-07-11 DIAGNOSIS — M5416 Radiculopathy, lumbar region: Secondary | ICD-10-CM | POA: Diagnosis not present

## 2018-07-11 DIAGNOSIS — N319 Neuromuscular dysfunction of bladder, unspecified: Secondary | ICD-10-CM | POA: Diagnosis not present

## 2018-07-11 DIAGNOSIS — N390 Urinary tract infection, site not specified: Secondary | ICD-10-CM | POA: Diagnosis not present

## 2018-07-11 DIAGNOSIS — L89312 Pressure ulcer of right buttock, stage 2: Secondary | ICD-10-CM | POA: Diagnosis not present

## 2018-07-11 DIAGNOSIS — R338 Other retention of urine: Secondary | ICD-10-CM | POA: Diagnosis not present

## 2018-07-11 DIAGNOSIS — Z9181 History of falling: Secondary | ICD-10-CM | POA: Diagnosis not present

## 2018-07-11 DIAGNOSIS — E1142 Type 2 diabetes mellitus with diabetic polyneuropathy: Secondary | ICD-10-CM | POA: Diagnosis not present

## 2018-07-11 DIAGNOSIS — I10 Essential (primary) hypertension: Secondary | ICD-10-CM | POA: Diagnosis not present

## 2018-07-11 DIAGNOSIS — Z7982 Long term (current) use of aspirin: Secondary | ICD-10-CM | POA: Diagnosis not present

## 2018-07-11 DIAGNOSIS — Z87440 Personal history of urinary (tract) infections: Secondary | ICD-10-CM | POA: Diagnosis not present

## 2018-07-11 DIAGNOSIS — R238 Other skin changes: Secondary | ICD-10-CM | POA: Diagnosis not present

## 2018-07-11 DIAGNOSIS — Z8631 Personal history of diabetic foot ulcer: Secondary | ICD-10-CM | POA: Diagnosis not present

## 2018-07-11 DIAGNOSIS — Z4800 Encounter for change or removal of nonsurgical wound dressing: Secondary | ICD-10-CM | POA: Diagnosis not present

## 2018-07-11 DIAGNOSIS — Z466 Encounter for fitting and adjustment of urinary device: Secondary | ICD-10-CM | POA: Diagnosis not present

## 2018-07-11 LAB — URINALYSIS, ROUTINE W REFLEX MICROSCOPIC
Bilirubin Urine: NEGATIVE
Glucose, UA: NEGATIVE mg/dL
Hgb urine dipstick: NEGATIVE
Ketones, ur: NEGATIVE mg/dL
Nitrite: NEGATIVE
Protein, ur: 100 mg/dL — AB
Specific Gravity, Urine: 1.023 (ref 1.005–1.030)
WBC, UA: >50 WBC/hpf — ABNORMAL HIGH (ref 0–5)
pH: 5 (ref 5.0–8.0)

## 2018-07-12 DIAGNOSIS — Z8631 Personal history of diabetic foot ulcer: Secondary | ICD-10-CM | POA: Diagnosis not present

## 2018-07-12 DIAGNOSIS — E1142 Type 2 diabetes mellitus with diabetic polyneuropathy: Secondary | ICD-10-CM | POA: Diagnosis not present

## 2018-07-12 DIAGNOSIS — Z9181 History of falling: Secondary | ICD-10-CM | POA: Diagnosis not present

## 2018-07-12 DIAGNOSIS — Z7982 Long term (current) use of aspirin: Secondary | ICD-10-CM | POA: Diagnosis not present

## 2018-07-12 DIAGNOSIS — R338 Other retention of urine: Secondary | ICD-10-CM | POA: Diagnosis not present

## 2018-07-12 DIAGNOSIS — Z794 Long term (current) use of insulin: Secondary | ICD-10-CM | POA: Diagnosis not present

## 2018-07-12 DIAGNOSIS — Z87440 Personal history of urinary (tract) infections: Secondary | ICD-10-CM | POA: Diagnosis not present

## 2018-07-12 DIAGNOSIS — I10 Essential (primary) hypertension: Secondary | ICD-10-CM | POA: Diagnosis not present

## 2018-07-12 DIAGNOSIS — L89312 Pressure ulcer of right buttock, stage 2: Secondary | ICD-10-CM | POA: Diagnosis not present

## 2018-07-12 DIAGNOSIS — R238 Other skin changes: Secondary | ICD-10-CM | POA: Diagnosis not present

## 2018-07-12 DIAGNOSIS — Z466 Encounter for fitting and adjustment of urinary device: Secondary | ICD-10-CM | POA: Diagnosis not present

## 2018-07-12 DIAGNOSIS — N319 Neuromuscular dysfunction of bladder, unspecified: Secondary | ICD-10-CM | POA: Diagnosis not present

## 2018-07-12 DIAGNOSIS — Z4800 Encounter for change or removal of nonsurgical wound dressing: Secondary | ICD-10-CM | POA: Diagnosis not present

## 2018-07-12 DIAGNOSIS — Z79891 Long term (current) use of opiate analgesic: Secondary | ICD-10-CM | POA: Diagnosis not present

## 2018-07-12 DIAGNOSIS — N139 Obstructive and reflux uropathy, unspecified: Secondary | ICD-10-CM | POA: Diagnosis not present

## 2018-07-12 DIAGNOSIS — M5416 Radiculopathy, lumbar region: Secondary | ICD-10-CM | POA: Diagnosis not present

## 2018-07-13 DIAGNOSIS — Z9181 History of falling: Secondary | ICD-10-CM | POA: Diagnosis not present

## 2018-07-13 DIAGNOSIS — Z4800 Encounter for change or removal of nonsurgical wound dressing: Secondary | ICD-10-CM | POA: Diagnosis not present

## 2018-07-13 DIAGNOSIS — R238 Other skin changes: Secondary | ICD-10-CM | POA: Diagnosis not present

## 2018-07-13 DIAGNOSIS — Z466 Encounter for fitting and adjustment of urinary device: Secondary | ICD-10-CM | POA: Diagnosis not present

## 2018-07-13 DIAGNOSIS — Z8631 Personal history of diabetic foot ulcer: Secondary | ICD-10-CM | POA: Diagnosis not present

## 2018-07-13 DIAGNOSIS — Z7982 Long term (current) use of aspirin: Secondary | ICD-10-CM | POA: Diagnosis not present

## 2018-07-13 DIAGNOSIS — Z87440 Personal history of urinary (tract) infections: Secondary | ICD-10-CM | POA: Diagnosis not present

## 2018-07-13 DIAGNOSIS — R338 Other retention of urine: Secondary | ICD-10-CM | POA: Diagnosis not present

## 2018-07-13 DIAGNOSIS — N319 Neuromuscular dysfunction of bladder, unspecified: Secondary | ICD-10-CM | POA: Diagnosis not present

## 2018-07-13 DIAGNOSIS — L89312 Pressure ulcer of right buttock, stage 2: Secondary | ICD-10-CM | POA: Diagnosis not present

## 2018-07-13 DIAGNOSIS — Z794 Long term (current) use of insulin: Secondary | ICD-10-CM | POA: Diagnosis not present

## 2018-07-13 DIAGNOSIS — Z79891 Long term (current) use of opiate analgesic: Secondary | ICD-10-CM | POA: Diagnosis not present

## 2018-07-13 DIAGNOSIS — E1142 Type 2 diabetes mellitus with diabetic polyneuropathy: Secondary | ICD-10-CM | POA: Diagnosis not present

## 2018-07-13 DIAGNOSIS — I10 Essential (primary) hypertension: Secondary | ICD-10-CM | POA: Diagnosis not present

## 2018-07-13 DIAGNOSIS — M5416 Radiculopathy, lumbar region: Secondary | ICD-10-CM | POA: Diagnosis not present

## 2018-07-13 DIAGNOSIS — N139 Obstructive and reflux uropathy, unspecified: Secondary | ICD-10-CM | POA: Diagnosis not present

## 2018-07-13 LAB — URINE CULTURE: Culture: 40000 — AB

## 2018-07-14 DIAGNOSIS — Z7982 Long term (current) use of aspirin: Secondary | ICD-10-CM | POA: Diagnosis not present

## 2018-07-14 DIAGNOSIS — I10 Essential (primary) hypertension: Secondary | ICD-10-CM | POA: Diagnosis not present

## 2018-07-14 DIAGNOSIS — M5416 Radiculopathy, lumbar region: Secondary | ICD-10-CM | POA: Diagnosis not present

## 2018-07-14 DIAGNOSIS — N139 Obstructive and reflux uropathy, unspecified: Secondary | ICD-10-CM | POA: Diagnosis not present

## 2018-07-14 DIAGNOSIS — N319 Neuromuscular dysfunction of bladder, unspecified: Secondary | ICD-10-CM | POA: Diagnosis not present

## 2018-07-14 DIAGNOSIS — Z466 Encounter for fitting and adjustment of urinary device: Secondary | ICD-10-CM | POA: Diagnosis not present

## 2018-07-14 DIAGNOSIS — Z79891 Long term (current) use of opiate analgesic: Secondary | ICD-10-CM | POA: Diagnosis not present

## 2018-07-14 DIAGNOSIS — E1142 Type 2 diabetes mellitus with diabetic polyneuropathy: Secondary | ICD-10-CM | POA: Diagnosis not present

## 2018-07-14 DIAGNOSIS — L89312 Pressure ulcer of right buttock, stage 2: Secondary | ICD-10-CM | POA: Diagnosis not present

## 2018-07-14 DIAGNOSIS — R338 Other retention of urine: Secondary | ICD-10-CM | POA: Diagnosis not present

## 2018-07-14 DIAGNOSIS — Z8631 Personal history of diabetic foot ulcer: Secondary | ICD-10-CM | POA: Diagnosis not present

## 2018-07-14 DIAGNOSIS — Z794 Long term (current) use of insulin: Secondary | ICD-10-CM | POA: Diagnosis not present

## 2018-07-14 DIAGNOSIS — Z9181 History of falling: Secondary | ICD-10-CM | POA: Diagnosis not present

## 2018-07-14 DIAGNOSIS — R238 Other skin changes: Secondary | ICD-10-CM | POA: Diagnosis not present

## 2018-07-14 DIAGNOSIS — Z4800 Encounter for change or removal of nonsurgical wound dressing: Secondary | ICD-10-CM | POA: Diagnosis not present

## 2018-07-14 DIAGNOSIS — Z87440 Personal history of urinary (tract) infections: Secondary | ICD-10-CM | POA: Diagnosis not present

## 2018-07-17 DIAGNOSIS — Z9181 History of falling: Secondary | ICD-10-CM | POA: Diagnosis not present

## 2018-07-17 DIAGNOSIS — Z4800 Encounter for change or removal of nonsurgical wound dressing: Secondary | ICD-10-CM | POA: Diagnosis not present

## 2018-07-17 DIAGNOSIS — R338 Other retention of urine: Secondary | ICD-10-CM | POA: Diagnosis not present

## 2018-07-17 DIAGNOSIS — Z87440 Personal history of urinary (tract) infections: Secondary | ICD-10-CM | POA: Diagnosis not present

## 2018-07-17 DIAGNOSIS — M5416 Radiculopathy, lumbar region: Secondary | ICD-10-CM | POA: Diagnosis not present

## 2018-07-17 DIAGNOSIS — Z466 Encounter for fitting and adjustment of urinary device: Secondary | ICD-10-CM | POA: Diagnosis not present

## 2018-07-17 DIAGNOSIS — N139 Obstructive and reflux uropathy, unspecified: Secondary | ICD-10-CM | POA: Diagnosis not present

## 2018-07-17 DIAGNOSIS — L89312 Pressure ulcer of right buttock, stage 2: Secondary | ICD-10-CM | POA: Diagnosis not present

## 2018-07-17 DIAGNOSIS — E1142 Type 2 diabetes mellitus with diabetic polyneuropathy: Secondary | ICD-10-CM | POA: Diagnosis not present

## 2018-07-17 DIAGNOSIS — Z79891 Long term (current) use of opiate analgesic: Secondary | ICD-10-CM | POA: Diagnosis not present

## 2018-07-17 DIAGNOSIS — Z8631 Personal history of diabetic foot ulcer: Secondary | ICD-10-CM | POA: Diagnosis not present

## 2018-07-17 DIAGNOSIS — N319 Neuromuscular dysfunction of bladder, unspecified: Secondary | ICD-10-CM | POA: Diagnosis not present

## 2018-07-17 DIAGNOSIS — Z7982 Long term (current) use of aspirin: Secondary | ICD-10-CM | POA: Diagnosis not present

## 2018-07-17 DIAGNOSIS — Z794 Long term (current) use of insulin: Secondary | ICD-10-CM | POA: Diagnosis not present

## 2018-07-17 DIAGNOSIS — I10 Essential (primary) hypertension: Secondary | ICD-10-CM | POA: Diagnosis not present

## 2018-07-17 DIAGNOSIS — R238 Other skin changes: Secondary | ICD-10-CM | POA: Diagnosis not present

## 2018-07-18 DIAGNOSIS — N319 Neuromuscular dysfunction of bladder, unspecified: Secondary | ICD-10-CM | POA: Diagnosis not present

## 2018-07-18 DIAGNOSIS — R238 Other skin changes: Secondary | ICD-10-CM | POA: Diagnosis not present

## 2018-07-18 DIAGNOSIS — Z794 Long term (current) use of insulin: Secondary | ICD-10-CM | POA: Diagnosis not present

## 2018-07-18 DIAGNOSIS — I10 Essential (primary) hypertension: Secondary | ICD-10-CM | POA: Diagnosis not present

## 2018-07-18 DIAGNOSIS — L89312 Pressure ulcer of right buttock, stage 2: Secondary | ICD-10-CM | POA: Diagnosis not present

## 2018-07-18 DIAGNOSIS — Z7982 Long term (current) use of aspirin: Secondary | ICD-10-CM | POA: Diagnosis not present

## 2018-07-18 DIAGNOSIS — Z8631 Personal history of diabetic foot ulcer: Secondary | ICD-10-CM | POA: Diagnosis not present

## 2018-07-18 DIAGNOSIS — E1142 Type 2 diabetes mellitus with diabetic polyneuropathy: Secondary | ICD-10-CM | POA: Diagnosis not present

## 2018-07-18 DIAGNOSIS — Z4800 Encounter for change or removal of nonsurgical wound dressing: Secondary | ICD-10-CM | POA: Diagnosis not present

## 2018-07-18 DIAGNOSIS — R338 Other retention of urine: Secondary | ICD-10-CM | POA: Diagnosis not present

## 2018-07-18 DIAGNOSIS — M5416 Radiculopathy, lumbar region: Secondary | ICD-10-CM | POA: Diagnosis not present

## 2018-07-18 DIAGNOSIS — Z79891 Long term (current) use of opiate analgesic: Secondary | ICD-10-CM | POA: Diagnosis not present

## 2018-07-18 DIAGNOSIS — Z466 Encounter for fitting and adjustment of urinary device: Secondary | ICD-10-CM | POA: Diagnosis not present

## 2018-07-18 DIAGNOSIS — N139 Obstructive and reflux uropathy, unspecified: Secondary | ICD-10-CM | POA: Diagnosis not present

## 2018-07-18 DIAGNOSIS — Z87440 Personal history of urinary (tract) infections: Secondary | ICD-10-CM | POA: Diagnosis not present

## 2018-07-18 DIAGNOSIS — Z9181 History of falling: Secondary | ICD-10-CM | POA: Diagnosis not present

## 2018-07-20 DIAGNOSIS — Z794 Long term (current) use of insulin: Secondary | ICD-10-CM | POA: Diagnosis not present

## 2018-07-20 DIAGNOSIS — Z9181 History of falling: Secondary | ICD-10-CM | POA: Diagnosis not present

## 2018-07-20 DIAGNOSIS — N319 Neuromuscular dysfunction of bladder, unspecified: Secondary | ICD-10-CM | POA: Diagnosis not present

## 2018-07-20 DIAGNOSIS — I10 Essential (primary) hypertension: Secondary | ICD-10-CM | POA: Diagnosis not present

## 2018-07-20 DIAGNOSIS — Z7982 Long term (current) use of aspirin: Secondary | ICD-10-CM | POA: Diagnosis not present

## 2018-07-20 DIAGNOSIS — Z4800 Encounter for change or removal of nonsurgical wound dressing: Secondary | ICD-10-CM | POA: Diagnosis not present

## 2018-07-20 DIAGNOSIS — R238 Other skin changes: Secondary | ICD-10-CM | POA: Diagnosis not present

## 2018-07-20 DIAGNOSIS — M5416 Radiculopathy, lumbar region: Secondary | ICD-10-CM | POA: Diagnosis not present

## 2018-07-20 DIAGNOSIS — E1142 Type 2 diabetes mellitus with diabetic polyneuropathy: Secondary | ICD-10-CM | POA: Diagnosis not present

## 2018-07-20 DIAGNOSIS — Z79891 Long term (current) use of opiate analgesic: Secondary | ICD-10-CM | POA: Diagnosis not present

## 2018-07-20 DIAGNOSIS — R338 Other retention of urine: Secondary | ICD-10-CM | POA: Diagnosis not present

## 2018-07-20 DIAGNOSIS — Z466 Encounter for fitting and adjustment of urinary device: Secondary | ICD-10-CM | POA: Diagnosis not present

## 2018-07-20 DIAGNOSIS — N139 Obstructive and reflux uropathy, unspecified: Secondary | ICD-10-CM | POA: Diagnosis not present

## 2018-07-20 DIAGNOSIS — L89312 Pressure ulcer of right buttock, stage 2: Secondary | ICD-10-CM | POA: Diagnosis not present

## 2018-07-20 DIAGNOSIS — Z87440 Personal history of urinary (tract) infections: Secondary | ICD-10-CM | POA: Diagnosis not present

## 2018-07-20 DIAGNOSIS — Z8631 Personal history of diabetic foot ulcer: Secondary | ICD-10-CM | POA: Diagnosis not present

## 2018-07-21 DIAGNOSIS — Z8631 Personal history of diabetic foot ulcer: Secondary | ICD-10-CM | POA: Diagnosis not present

## 2018-07-21 DIAGNOSIS — E1142 Type 2 diabetes mellitus with diabetic polyneuropathy: Secondary | ICD-10-CM | POA: Diagnosis not present

## 2018-07-21 DIAGNOSIS — R238 Other skin changes: Secondary | ICD-10-CM | POA: Diagnosis not present

## 2018-07-21 DIAGNOSIS — N139 Obstructive and reflux uropathy, unspecified: Secondary | ICD-10-CM | POA: Diagnosis not present

## 2018-07-21 DIAGNOSIS — Z794 Long term (current) use of insulin: Secondary | ICD-10-CM | POA: Diagnosis not present

## 2018-07-21 DIAGNOSIS — Z79891 Long term (current) use of opiate analgesic: Secondary | ICD-10-CM | POA: Diagnosis not present

## 2018-07-21 DIAGNOSIS — Z9181 History of falling: Secondary | ICD-10-CM | POA: Diagnosis not present

## 2018-07-21 DIAGNOSIS — I10 Essential (primary) hypertension: Secondary | ICD-10-CM | POA: Diagnosis not present

## 2018-07-21 DIAGNOSIS — N319 Neuromuscular dysfunction of bladder, unspecified: Secondary | ICD-10-CM | POA: Diagnosis not present

## 2018-07-21 DIAGNOSIS — M5416 Radiculopathy, lumbar region: Secondary | ICD-10-CM | POA: Diagnosis not present

## 2018-07-21 DIAGNOSIS — Z87440 Personal history of urinary (tract) infections: Secondary | ICD-10-CM | POA: Diagnosis not present

## 2018-07-21 DIAGNOSIS — R338 Other retention of urine: Secondary | ICD-10-CM | POA: Diagnosis not present

## 2018-07-21 DIAGNOSIS — Z466 Encounter for fitting and adjustment of urinary device: Secondary | ICD-10-CM | POA: Diagnosis not present

## 2018-07-21 DIAGNOSIS — Z4800 Encounter for change or removal of nonsurgical wound dressing: Secondary | ICD-10-CM | POA: Diagnosis not present

## 2018-07-21 DIAGNOSIS — Z7982 Long term (current) use of aspirin: Secondary | ICD-10-CM | POA: Diagnosis not present

## 2018-07-21 DIAGNOSIS — L89312 Pressure ulcer of right buttock, stage 2: Secondary | ICD-10-CM | POA: Diagnosis not present

## 2018-07-24 DIAGNOSIS — Z7982 Long term (current) use of aspirin: Secondary | ICD-10-CM | POA: Diagnosis not present

## 2018-07-24 DIAGNOSIS — I10 Essential (primary) hypertension: Secondary | ICD-10-CM | POA: Diagnosis not present

## 2018-07-24 DIAGNOSIS — E1142 Type 2 diabetes mellitus with diabetic polyneuropathy: Secondary | ICD-10-CM | POA: Diagnosis not present

## 2018-07-24 DIAGNOSIS — M5416 Radiculopathy, lumbar region: Secondary | ICD-10-CM | POA: Diagnosis not present

## 2018-07-24 DIAGNOSIS — R238 Other skin changes: Secondary | ICD-10-CM | POA: Diagnosis not present

## 2018-07-24 DIAGNOSIS — R338 Other retention of urine: Secondary | ICD-10-CM | POA: Diagnosis not present

## 2018-07-24 DIAGNOSIS — N319 Neuromuscular dysfunction of bladder, unspecified: Secondary | ICD-10-CM | POA: Diagnosis not present

## 2018-07-24 DIAGNOSIS — Z8631 Personal history of diabetic foot ulcer: Secondary | ICD-10-CM | POA: Diagnosis not present

## 2018-07-24 DIAGNOSIS — Z79891 Long term (current) use of opiate analgesic: Secondary | ICD-10-CM | POA: Diagnosis not present

## 2018-07-24 DIAGNOSIS — Z466 Encounter for fitting and adjustment of urinary device: Secondary | ICD-10-CM | POA: Diagnosis not present

## 2018-07-24 DIAGNOSIS — N139 Obstructive and reflux uropathy, unspecified: Secondary | ICD-10-CM | POA: Diagnosis not present

## 2018-07-24 DIAGNOSIS — L89312 Pressure ulcer of right buttock, stage 2: Secondary | ICD-10-CM | POA: Diagnosis not present

## 2018-07-24 DIAGNOSIS — Z4800 Encounter for change or removal of nonsurgical wound dressing: Secondary | ICD-10-CM | POA: Diagnosis not present

## 2018-07-24 DIAGNOSIS — Z87440 Personal history of urinary (tract) infections: Secondary | ICD-10-CM | POA: Diagnosis not present

## 2018-07-24 DIAGNOSIS — Z9181 History of falling: Secondary | ICD-10-CM | POA: Diagnosis not present

## 2018-07-24 DIAGNOSIS — Z794 Long term (current) use of insulin: Secondary | ICD-10-CM | POA: Diagnosis not present

## 2018-07-25 DIAGNOSIS — Z87440 Personal history of urinary (tract) infections: Secondary | ICD-10-CM | POA: Diagnosis not present

## 2018-07-25 DIAGNOSIS — R238 Other skin changes: Secondary | ICD-10-CM | POA: Diagnosis not present

## 2018-07-25 DIAGNOSIS — Z7982 Long term (current) use of aspirin: Secondary | ICD-10-CM | POA: Diagnosis not present

## 2018-07-25 DIAGNOSIS — I10 Essential (primary) hypertension: Secondary | ICD-10-CM | POA: Diagnosis not present

## 2018-07-25 DIAGNOSIS — Z79891 Long term (current) use of opiate analgesic: Secondary | ICD-10-CM | POA: Diagnosis not present

## 2018-07-25 DIAGNOSIS — N139 Obstructive and reflux uropathy, unspecified: Secondary | ICD-10-CM | POA: Diagnosis not present

## 2018-07-25 DIAGNOSIS — Z9181 History of falling: Secondary | ICD-10-CM | POA: Diagnosis not present

## 2018-07-25 DIAGNOSIS — Z8631 Personal history of diabetic foot ulcer: Secondary | ICD-10-CM | POA: Diagnosis not present

## 2018-07-25 DIAGNOSIS — E1142 Type 2 diabetes mellitus with diabetic polyneuropathy: Secondary | ICD-10-CM | POA: Diagnosis not present

## 2018-07-25 DIAGNOSIS — M5416 Radiculopathy, lumbar region: Secondary | ICD-10-CM | POA: Diagnosis not present

## 2018-07-25 DIAGNOSIS — L89312 Pressure ulcer of right buttock, stage 2: Secondary | ICD-10-CM | POA: Diagnosis not present

## 2018-07-25 DIAGNOSIS — Z4800 Encounter for change or removal of nonsurgical wound dressing: Secondary | ICD-10-CM | POA: Diagnosis not present

## 2018-07-25 DIAGNOSIS — Z794 Long term (current) use of insulin: Secondary | ICD-10-CM | POA: Diagnosis not present

## 2018-07-25 DIAGNOSIS — R338 Other retention of urine: Secondary | ICD-10-CM | POA: Diagnosis not present

## 2018-07-25 DIAGNOSIS — N319 Neuromuscular dysfunction of bladder, unspecified: Secondary | ICD-10-CM | POA: Diagnosis not present

## 2018-07-25 DIAGNOSIS — Z466 Encounter for fitting and adjustment of urinary device: Secondary | ICD-10-CM | POA: Diagnosis not present

## 2018-07-27 DIAGNOSIS — L89312 Pressure ulcer of right buttock, stage 2: Secondary | ICD-10-CM | POA: Diagnosis not present

## 2018-07-27 DIAGNOSIS — R338 Other retention of urine: Secondary | ICD-10-CM | POA: Diagnosis not present

## 2018-07-27 DIAGNOSIS — Z4800 Encounter for change or removal of nonsurgical wound dressing: Secondary | ICD-10-CM | POA: Diagnosis not present

## 2018-07-27 DIAGNOSIS — Z8631 Personal history of diabetic foot ulcer: Secondary | ICD-10-CM | POA: Diagnosis not present

## 2018-07-27 DIAGNOSIS — M5416 Radiculopathy, lumbar region: Secondary | ICD-10-CM | POA: Diagnosis not present

## 2018-07-27 DIAGNOSIS — Z7982 Long term (current) use of aspirin: Secondary | ICD-10-CM | POA: Diagnosis not present

## 2018-07-27 DIAGNOSIS — Z9181 History of falling: Secondary | ICD-10-CM | POA: Diagnosis not present

## 2018-07-27 DIAGNOSIS — Z87440 Personal history of urinary (tract) infections: Secondary | ICD-10-CM | POA: Diagnosis not present

## 2018-07-27 DIAGNOSIS — R238 Other skin changes: Secondary | ICD-10-CM | POA: Diagnosis not present

## 2018-07-27 DIAGNOSIS — I10 Essential (primary) hypertension: Secondary | ICD-10-CM | POA: Diagnosis not present

## 2018-07-27 DIAGNOSIS — E1142 Type 2 diabetes mellitus with diabetic polyneuropathy: Secondary | ICD-10-CM | POA: Diagnosis not present

## 2018-07-27 DIAGNOSIS — N319 Neuromuscular dysfunction of bladder, unspecified: Secondary | ICD-10-CM | POA: Diagnosis not present

## 2018-07-27 DIAGNOSIS — Z794 Long term (current) use of insulin: Secondary | ICD-10-CM | POA: Diagnosis not present

## 2018-07-27 DIAGNOSIS — N139 Obstructive and reflux uropathy, unspecified: Secondary | ICD-10-CM | POA: Diagnosis not present

## 2018-07-27 DIAGNOSIS — Z79891 Long term (current) use of opiate analgesic: Secondary | ICD-10-CM | POA: Diagnosis not present

## 2018-07-27 DIAGNOSIS — Z466 Encounter for fitting and adjustment of urinary device: Secondary | ICD-10-CM | POA: Diagnosis not present

## 2018-07-28 DIAGNOSIS — Z87440 Personal history of urinary (tract) infections: Secondary | ICD-10-CM | POA: Diagnosis not present

## 2018-07-28 DIAGNOSIS — Z9181 History of falling: Secondary | ICD-10-CM | POA: Diagnosis not present

## 2018-07-28 DIAGNOSIS — Z8631 Personal history of diabetic foot ulcer: Secondary | ICD-10-CM | POA: Diagnosis not present

## 2018-07-28 DIAGNOSIS — Z7982 Long term (current) use of aspirin: Secondary | ICD-10-CM | POA: Diagnosis not present

## 2018-07-28 DIAGNOSIS — N139 Obstructive and reflux uropathy, unspecified: Secondary | ICD-10-CM | POA: Diagnosis not present

## 2018-07-28 DIAGNOSIS — Z794 Long term (current) use of insulin: Secondary | ICD-10-CM | POA: Diagnosis not present

## 2018-07-28 DIAGNOSIS — L89312 Pressure ulcer of right buttock, stage 2: Secondary | ICD-10-CM | POA: Diagnosis not present

## 2018-07-28 DIAGNOSIS — R338 Other retention of urine: Secondary | ICD-10-CM | POA: Diagnosis not present

## 2018-07-28 DIAGNOSIS — Z466 Encounter for fitting and adjustment of urinary device: Secondary | ICD-10-CM | POA: Diagnosis not present

## 2018-07-28 DIAGNOSIS — E1142 Type 2 diabetes mellitus with diabetic polyneuropathy: Secondary | ICD-10-CM | POA: Diagnosis not present

## 2018-07-28 DIAGNOSIS — R238 Other skin changes: Secondary | ICD-10-CM | POA: Diagnosis not present

## 2018-07-28 DIAGNOSIS — Z79891 Long term (current) use of opiate analgesic: Secondary | ICD-10-CM | POA: Diagnosis not present

## 2018-07-28 DIAGNOSIS — N319 Neuromuscular dysfunction of bladder, unspecified: Secondary | ICD-10-CM | POA: Diagnosis not present

## 2018-07-28 DIAGNOSIS — Z4800 Encounter for change or removal of nonsurgical wound dressing: Secondary | ICD-10-CM | POA: Diagnosis not present

## 2018-07-28 DIAGNOSIS — M5416 Radiculopathy, lumbar region: Secondary | ICD-10-CM | POA: Diagnosis not present

## 2018-07-28 DIAGNOSIS — I10 Essential (primary) hypertension: Secondary | ICD-10-CM | POA: Diagnosis not present

## 2018-07-31 DIAGNOSIS — E1142 Type 2 diabetes mellitus with diabetic polyneuropathy: Secondary | ICD-10-CM | POA: Diagnosis not present

## 2018-07-31 DIAGNOSIS — R338 Other retention of urine: Secondary | ICD-10-CM | POA: Diagnosis not present

## 2018-07-31 DIAGNOSIS — Z466 Encounter for fitting and adjustment of urinary device: Secondary | ICD-10-CM | POA: Diagnosis not present

## 2018-07-31 DIAGNOSIS — L89312 Pressure ulcer of right buttock, stage 2: Secondary | ICD-10-CM | POA: Diagnosis not present

## 2018-07-31 DIAGNOSIS — N319 Neuromuscular dysfunction of bladder, unspecified: Secondary | ICD-10-CM | POA: Diagnosis not present

## 2018-07-31 DIAGNOSIS — M5416 Radiculopathy, lumbar region: Secondary | ICD-10-CM | POA: Diagnosis not present

## 2018-07-31 DIAGNOSIS — Z7982 Long term (current) use of aspirin: Secondary | ICD-10-CM | POA: Diagnosis not present

## 2018-07-31 DIAGNOSIS — N139 Obstructive and reflux uropathy, unspecified: Secondary | ICD-10-CM | POA: Diagnosis not present

## 2018-07-31 DIAGNOSIS — Z4800 Encounter for change or removal of nonsurgical wound dressing: Secondary | ICD-10-CM | POA: Diagnosis not present

## 2018-07-31 DIAGNOSIS — Z87440 Personal history of urinary (tract) infections: Secondary | ICD-10-CM | POA: Diagnosis not present

## 2018-07-31 DIAGNOSIS — Z79891 Long term (current) use of opiate analgesic: Secondary | ICD-10-CM | POA: Diagnosis not present

## 2018-07-31 DIAGNOSIS — Z794 Long term (current) use of insulin: Secondary | ICD-10-CM | POA: Diagnosis not present

## 2018-07-31 DIAGNOSIS — R238 Other skin changes: Secondary | ICD-10-CM | POA: Diagnosis not present

## 2018-07-31 DIAGNOSIS — I10 Essential (primary) hypertension: Secondary | ICD-10-CM | POA: Diagnosis not present

## 2018-07-31 DIAGNOSIS — Z9181 History of falling: Secondary | ICD-10-CM | POA: Diagnosis not present

## 2018-07-31 DIAGNOSIS — Z8631 Personal history of diabetic foot ulcer: Secondary | ICD-10-CM | POA: Diagnosis not present

## 2018-08-01 DIAGNOSIS — Z7982 Long term (current) use of aspirin: Secondary | ICD-10-CM | POA: Diagnosis not present

## 2018-08-01 DIAGNOSIS — R238 Other skin changes: Secondary | ICD-10-CM | POA: Diagnosis not present

## 2018-08-01 DIAGNOSIS — I10 Essential (primary) hypertension: Secondary | ICD-10-CM | POA: Diagnosis not present

## 2018-08-01 DIAGNOSIS — L89312 Pressure ulcer of right buttock, stage 2: Secondary | ICD-10-CM | POA: Diagnosis not present

## 2018-08-01 DIAGNOSIS — Z4800 Encounter for change or removal of nonsurgical wound dressing: Secondary | ICD-10-CM | POA: Diagnosis not present

## 2018-08-01 DIAGNOSIS — Z87440 Personal history of urinary (tract) infections: Secondary | ICD-10-CM | POA: Diagnosis not present

## 2018-08-01 DIAGNOSIS — Z466 Encounter for fitting and adjustment of urinary device: Secondary | ICD-10-CM | POA: Diagnosis not present

## 2018-08-01 DIAGNOSIS — E1142 Type 2 diabetes mellitus with diabetic polyneuropathy: Secondary | ICD-10-CM | POA: Diagnosis not present

## 2018-08-01 DIAGNOSIS — Z794 Long term (current) use of insulin: Secondary | ICD-10-CM | POA: Diagnosis not present

## 2018-08-01 DIAGNOSIS — N139 Obstructive and reflux uropathy, unspecified: Secondary | ICD-10-CM | POA: Diagnosis not present

## 2018-08-01 DIAGNOSIS — Z79891 Long term (current) use of opiate analgesic: Secondary | ICD-10-CM | POA: Diagnosis not present

## 2018-08-01 DIAGNOSIS — Z8631 Personal history of diabetic foot ulcer: Secondary | ICD-10-CM | POA: Diagnosis not present

## 2018-08-01 DIAGNOSIS — Z9181 History of falling: Secondary | ICD-10-CM | POA: Diagnosis not present

## 2018-08-01 DIAGNOSIS — R338 Other retention of urine: Secondary | ICD-10-CM | POA: Diagnosis not present

## 2018-08-01 DIAGNOSIS — M5416 Radiculopathy, lumbar region: Secondary | ICD-10-CM | POA: Diagnosis not present

## 2018-08-01 DIAGNOSIS — N319 Neuromuscular dysfunction of bladder, unspecified: Secondary | ICD-10-CM | POA: Diagnosis not present

## 2018-08-04 DIAGNOSIS — Z87440 Personal history of urinary (tract) infections: Secondary | ICD-10-CM | POA: Diagnosis not present

## 2018-08-04 DIAGNOSIS — L89312 Pressure ulcer of right buttock, stage 2: Secondary | ICD-10-CM | POA: Diagnosis not present

## 2018-08-04 DIAGNOSIS — Z466 Encounter for fitting and adjustment of urinary device: Secondary | ICD-10-CM | POA: Diagnosis not present

## 2018-08-04 DIAGNOSIS — M5416 Radiculopathy, lumbar region: Secondary | ICD-10-CM | POA: Diagnosis not present

## 2018-08-04 DIAGNOSIS — E1142 Type 2 diabetes mellitus with diabetic polyneuropathy: Secondary | ICD-10-CM | POA: Diagnosis not present

## 2018-08-04 DIAGNOSIS — Z8631 Personal history of diabetic foot ulcer: Secondary | ICD-10-CM | POA: Diagnosis not present

## 2018-08-04 DIAGNOSIS — R338 Other retention of urine: Secondary | ICD-10-CM | POA: Diagnosis not present

## 2018-08-04 DIAGNOSIS — Z7982 Long term (current) use of aspirin: Secondary | ICD-10-CM | POA: Diagnosis not present

## 2018-08-04 DIAGNOSIS — Z9181 History of falling: Secondary | ICD-10-CM | POA: Diagnosis not present

## 2018-08-04 DIAGNOSIS — Z4800 Encounter for change or removal of nonsurgical wound dressing: Secondary | ICD-10-CM | POA: Diagnosis not present

## 2018-08-04 DIAGNOSIS — I10 Essential (primary) hypertension: Secondary | ICD-10-CM | POA: Diagnosis not present

## 2018-08-04 DIAGNOSIS — R238 Other skin changes: Secondary | ICD-10-CM | POA: Diagnosis not present

## 2018-08-04 DIAGNOSIS — Z794 Long term (current) use of insulin: Secondary | ICD-10-CM | POA: Diagnosis not present

## 2018-08-04 DIAGNOSIS — Z79891 Long term (current) use of opiate analgesic: Secondary | ICD-10-CM | POA: Diagnosis not present

## 2018-08-04 DIAGNOSIS — N319 Neuromuscular dysfunction of bladder, unspecified: Secondary | ICD-10-CM | POA: Diagnosis not present

## 2018-08-04 DIAGNOSIS — N139 Obstructive and reflux uropathy, unspecified: Secondary | ICD-10-CM | POA: Diagnosis not present

## 2018-08-08 DIAGNOSIS — I10 Essential (primary) hypertension: Secondary | ICD-10-CM | POA: Diagnosis not present

## 2018-08-08 DIAGNOSIS — M5416 Radiculopathy, lumbar region: Secondary | ICD-10-CM | POA: Diagnosis not present

## 2018-08-08 DIAGNOSIS — Z794 Long term (current) use of insulin: Secondary | ICD-10-CM | POA: Diagnosis not present

## 2018-08-08 DIAGNOSIS — Z4800 Encounter for change or removal of nonsurgical wound dressing: Secondary | ICD-10-CM | POA: Diagnosis not present

## 2018-08-08 DIAGNOSIS — Z87440 Personal history of urinary (tract) infections: Secondary | ICD-10-CM | POA: Diagnosis not present

## 2018-08-08 DIAGNOSIS — Z466 Encounter for fitting and adjustment of urinary device: Secondary | ICD-10-CM | POA: Diagnosis not present

## 2018-08-08 DIAGNOSIS — Z9181 History of falling: Secondary | ICD-10-CM | POA: Diagnosis not present

## 2018-08-08 DIAGNOSIS — Z79891 Long term (current) use of opiate analgesic: Secondary | ICD-10-CM | POA: Diagnosis not present

## 2018-08-08 DIAGNOSIS — N139 Obstructive and reflux uropathy, unspecified: Secondary | ICD-10-CM | POA: Diagnosis not present

## 2018-08-08 DIAGNOSIS — R238 Other skin changes: Secondary | ICD-10-CM | POA: Diagnosis not present

## 2018-08-08 DIAGNOSIS — L89312 Pressure ulcer of right buttock, stage 2: Secondary | ICD-10-CM | POA: Diagnosis not present

## 2018-08-08 DIAGNOSIS — Z7982 Long term (current) use of aspirin: Secondary | ICD-10-CM | POA: Diagnosis not present

## 2018-08-08 DIAGNOSIS — R338 Other retention of urine: Secondary | ICD-10-CM | POA: Diagnosis not present

## 2018-08-08 DIAGNOSIS — E1142 Type 2 diabetes mellitus with diabetic polyneuropathy: Secondary | ICD-10-CM | POA: Diagnosis not present

## 2018-08-08 DIAGNOSIS — Z8631 Personal history of diabetic foot ulcer: Secondary | ICD-10-CM | POA: Diagnosis not present

## 2018-08-08 DIAGNOSIS — N319 Neuromuscular dysfunction of bladder, unspecified: Secondary | ICD-10-CM | POA: Diagnosis not present

## 2018-08-09 DIAGNOSIS — E1142 Type 2 diabetes mellitus with diabetic polyneuropathy: Secondary | ICD-10-CM | POA: Diagnosis not present

## 2018-08-09 DIAGNOSIS — R238 Other skin changes: Secondary | ICD-10-CM | POA: Diagnosis not present

## 2018-08-09 DIAGNOSIS — Z7982 Long term (current) use of aspirin: Secondary | ICD-10-CM | POA: Diagnosis not present

## 2018-08-09 DIAGNOSIS — R338 Other retention of urine: Secondary | ICD-10-CM | POA: Diagnosis not present

## 2018-08-09 DIAGNOSIS — Z4800 Encounter for change or removal of nonsurgical wound dressing: Secondary | ICD-10-CM | POA: Diagnosis not present

## 2018-08-09 DIAGNOSIS — Z87440 Personal history of urinary (tract) infections: Secondary | ICD-10-CM | POA: Diagnosis not present

## 2018-08-09 DIAGNOSIS — Z794 Long term (current) use of insulin: Secondary | ICD-10-CM | POA: Diagnosis not present

## 2018-08-09 DIAGNOSIS — M5416 Radiculopathy, lumbar region: Secondary | ICD-10-CM | POA: Diagnosis not present

## 2018-08-09 DIAGNOSIS — N319 Neuromuscular dysfunction of bladder, unspecified: Secondary | ICD-10-CM | POA: Diagnosis not present

## 2018-08-09 DIAGNOSIS — I10 Essential (primary) hypertension: Secondary | ICD-10-CM | POA: Diagnosis not present

## 2018-08-09 DIAGNOSIS — L89312 Pressure ulcer of right buttock, stage 2: Secondary | ICD-10-CM | POA: Diagnosis not present

## 2018-08-09 DIAGNOSIS — Z79891 Long term (current) use of opiate analgesic: Secondary | ICD-10-CM | POA: Diagnosis not present

## 2018-08-09 DIAGNOSIS — Z8631 Personal history of diabetic foot ulcer: Secondary | ICD-10-CM | POA: Diagnosis not present

## 2018-08-09 DIAGNOSIS — Z9181 History of falling: Secondary | ICD-10-CM | POA: Diagnosis not present

## 2018-08-09 DIAGNOSIS — N139 Obstructive and reflux uropathy, unspecified: Secondary | ICD-10-CM | POA: Diagnosis not present

## 2018-08-09 DIAGNOSIS — Z466 Encounter for fitting and adjustment of urinary device: Secondary | ICD-10-CM | POA: Diagnosis not present

## 2018-08-10 DIAGNOSIS — Z8631 Personal history of diabetic foot ulcer: Secondary | ICD-10-CM | POA: Diagnosis not present

## 2018-08-10 DIAGNOSIS — Z794 Long term (current) use of insulin: Secondary | ICD-10-CM | POA: Diagnosis not present

## 2018-08-10 DIAGNOSIS — N139 Obstructive and reflux uropathy, unspecified: Secondary | ICD-10-CM | POA: Diagnosis not present

## 2018-08-10 DIAGNOSIS — Z4800 Encounter for change or removal of nonsurgical wound dressing: Secondary | ICD-10-CM | POA: Diagnosis not present

## 2018-08-10 DIAGNOSIS — R338 Other retention of urine: Secondary | ICD-10-CM | POA: Diagnosis not present

## 2018-08-10 DIAGNOSIS — Z466 Encounter for fitting and adjustment of urinary device: Secondary | ICD-10-CM | POA: Diagnosis not present

## 2018-08-10 DIAGNOSIS — N319 Neuromuscular dysfunction of bladder, unspecified: Secondary | ICD-10-CM | POA: Diagnosis not present

## 2018-08-10 DIAGNOSIS — Z7982 Long term (current) use of aspirin: Secondary | ICD-10-CM | POA: Diagnosis not present

## 2018-08-10 DIAGNOSIS — Z9181 History of falling: Secondary | ICD-10-CM | POA: Diagnosis not present

## 2018-08-10 DIAGNOSIS — R238 Other skin changes: Secondary | ICD-10-CM | POA: Diagnosis not present

## 2018-08-10 DIAGNOSIS — I10 Essential (primary) hypertension: Secondary | ICD-10-CM | POA: Diagnosis not present

## 2018-08-10 DIAGNOSIS — L89312 Pressure ulcer of right buttock, stage 2: Secondary | ICD-10-CM | POA: Diagnosis not present

## 2018-08-10 DIAGNOSIS — Z79891 Long term (current) use of opiate analgesic: Secondary | ICD-10-CM | POA: Diagnosis not present

## 2018-08-10 DIAGNOSIS — Z87440 Personal history of urinary (tract) infections: Secondary | ICD-10-CM | POA: Diagnosis not present

## 2018-08-10 DIAGNOSIS — E1142 Type 2 diabetes mellitus with diabetic polyneuropathy: Secondary | ICD-10-CM | POA: Diagnosis not present

## 2018-08-10 DIAGNOSIS — M5416 Radiculopathy, lumbar region: Secondary | ICD-10-CM | POA: Diagnosis not present

## 2018-08-15 DIAGNOSIS — Z79891 Long term (current) use of opiate analgesic: Secondary | ICD-10-CM | POA: Diagnosis not present

## 2018-08-15 DIAGNOSIS — N139 Obstructive and reflux uropathy, unspecified: Secondary | ICD-10-CM | POA: Diagnosis not present

## 2018-08-15 DIAGNOSIS — Z4800 Encounter for change or removal of nonsurgical wound dressing: Secondary | ICD-10-CM | POA: Diagnosis not present

## 2018-08-15 DIAGNOSIS — Z466 Encounter for fitting and adjustment of urinary device: Secondary | ICD-10-CM | POA: Diagnosis not present

## 2018-08-15 DIAGNOSIS — E1142 Type 2 diabetes mellitus with diabetic polyneuropathy: Secondary | ICD-10-CM | POA: Diagnosis not present

## 2018-08-15 DIAGNOSIS — R338 Other retention of urine: Secondary | ICD-10-CM | POA: Diagnosis not present

## 2018-08-15 DIAGNOSIS — Z87440 Personal history of urinary (tract) infections: Secondary | ICD-10-CM | POA: Diagnosis not present

## 2018-08-15 DIAGNOSIS — Z9181 History of falling: Secondary | ICD-10-CM | POA: Diagnosis not present

## 2018-08-15 DIAGNOSIS — L89312 Pressure ulcer of right buttock, stage 2: Secondary | ICD-10-CM | POA: Diagnosis not present

## 2018-08-15 DIAGNOSIS — M5416 Radiculopathy, lumbar region: Secondary | ICD-10-CM | POA: Diagnosis not present

## 2018-08-15 DIAGNOSIS — I10 Essential (primary) hypertension: Secondary | ICD-10-CM | POA: Diagnosis not present

## 2018-08-15 DIAGNOSIS — N319 Neuromuscular dysfunction of bladder, unspecified: Secondary | ICD-10-CM | POA: Diagnosis not present

## 2018-08-15 DIAGNOSIS — Z8631 Personal history of diabetic foot ulcer: Secondary | ICD-10-CM | POA: Diagnosis not present

## 2018-08-15 DIAGNOSIS — Z7982 Long term (current) use of aspirin: Secondary | ICD-10-CM | POA: Diagnosis not present

## 2018-08-15 DIAGNOSIS — R238 Other skin changes: Secondary | ICD-10-CM | POA: Diagnosis not present

## 2018-08-15 DIAGNOSIS — Z794 Long term (current) use of insulin: Secondary | ICD-10-CM | POA: Diagnosis not present

## 2018-08-15 IMAGING — DX DG TIBIA/FIBULA 2V*L*
4 series · 4 of 4 positions shown · non-contrast
Comparison: None.

CLINICAL DATA: Fell today with swelling and pain

EXAM:
LEFT TIBIA AND FIBULA - 2 VIEW

[tibia ap (1 of 2)]
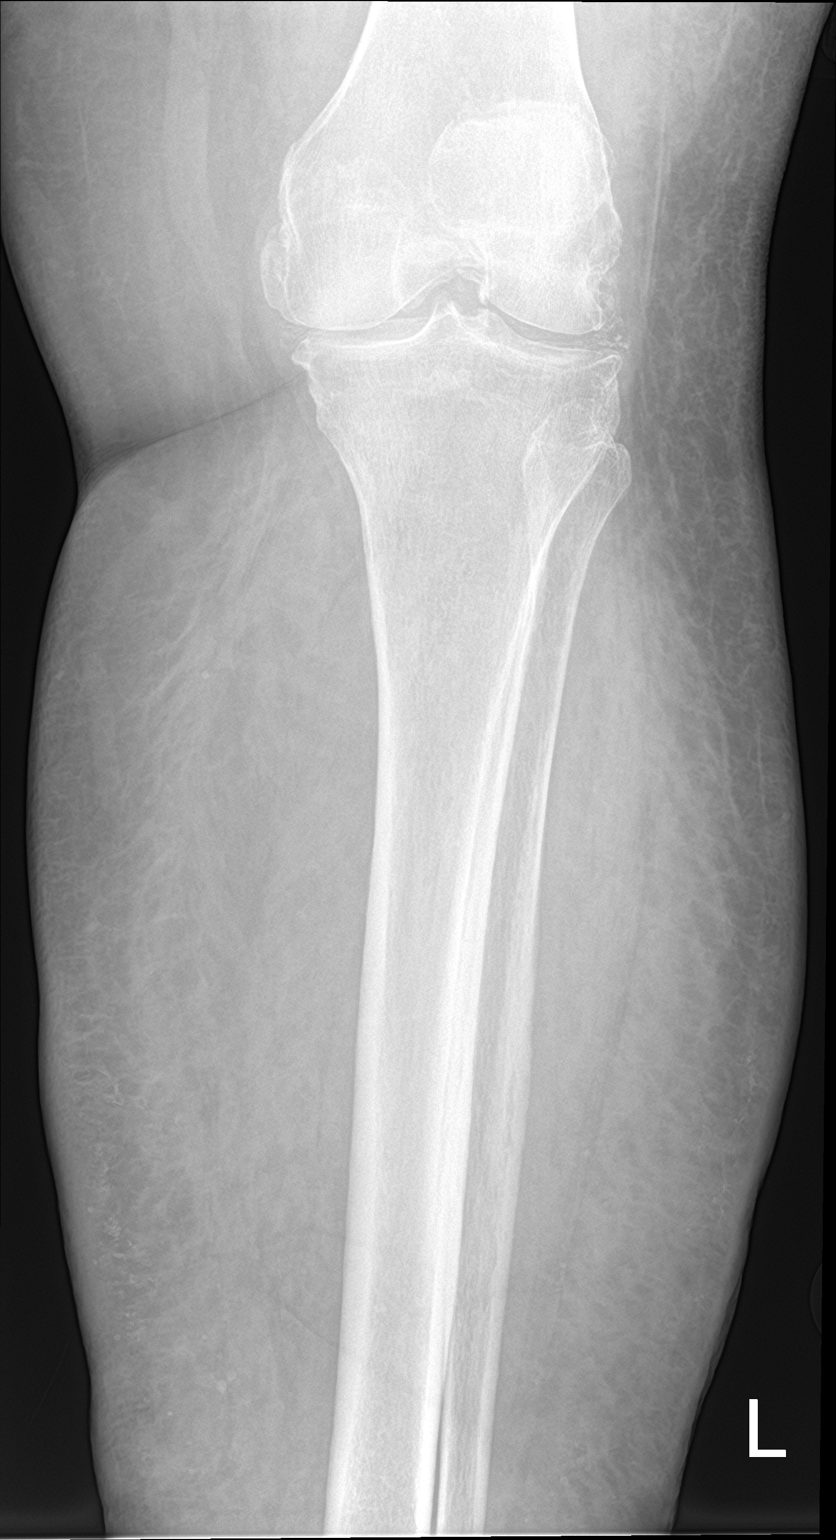

[tibia ap (2 of 2)]
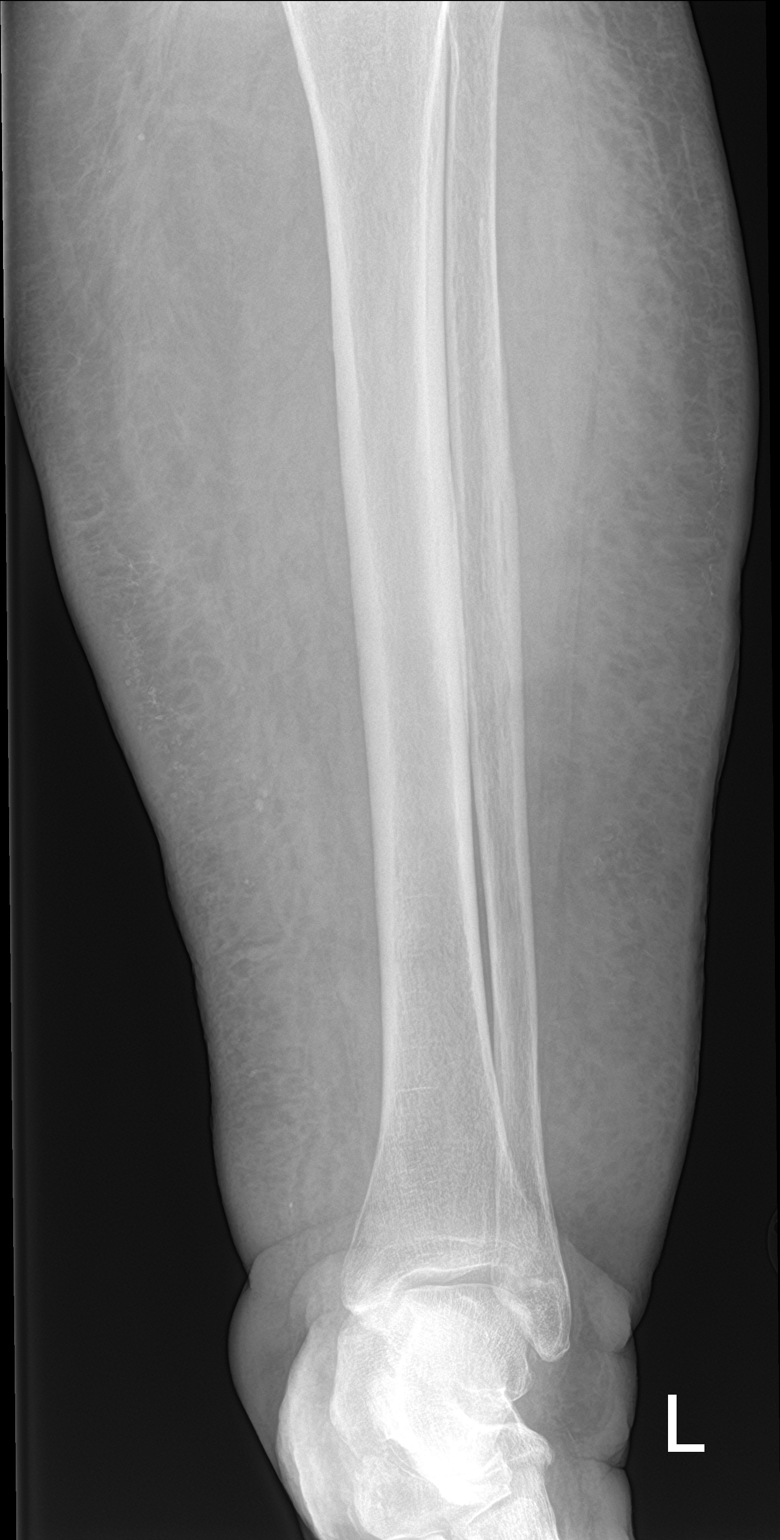

[tibia lat (1 of 2)]
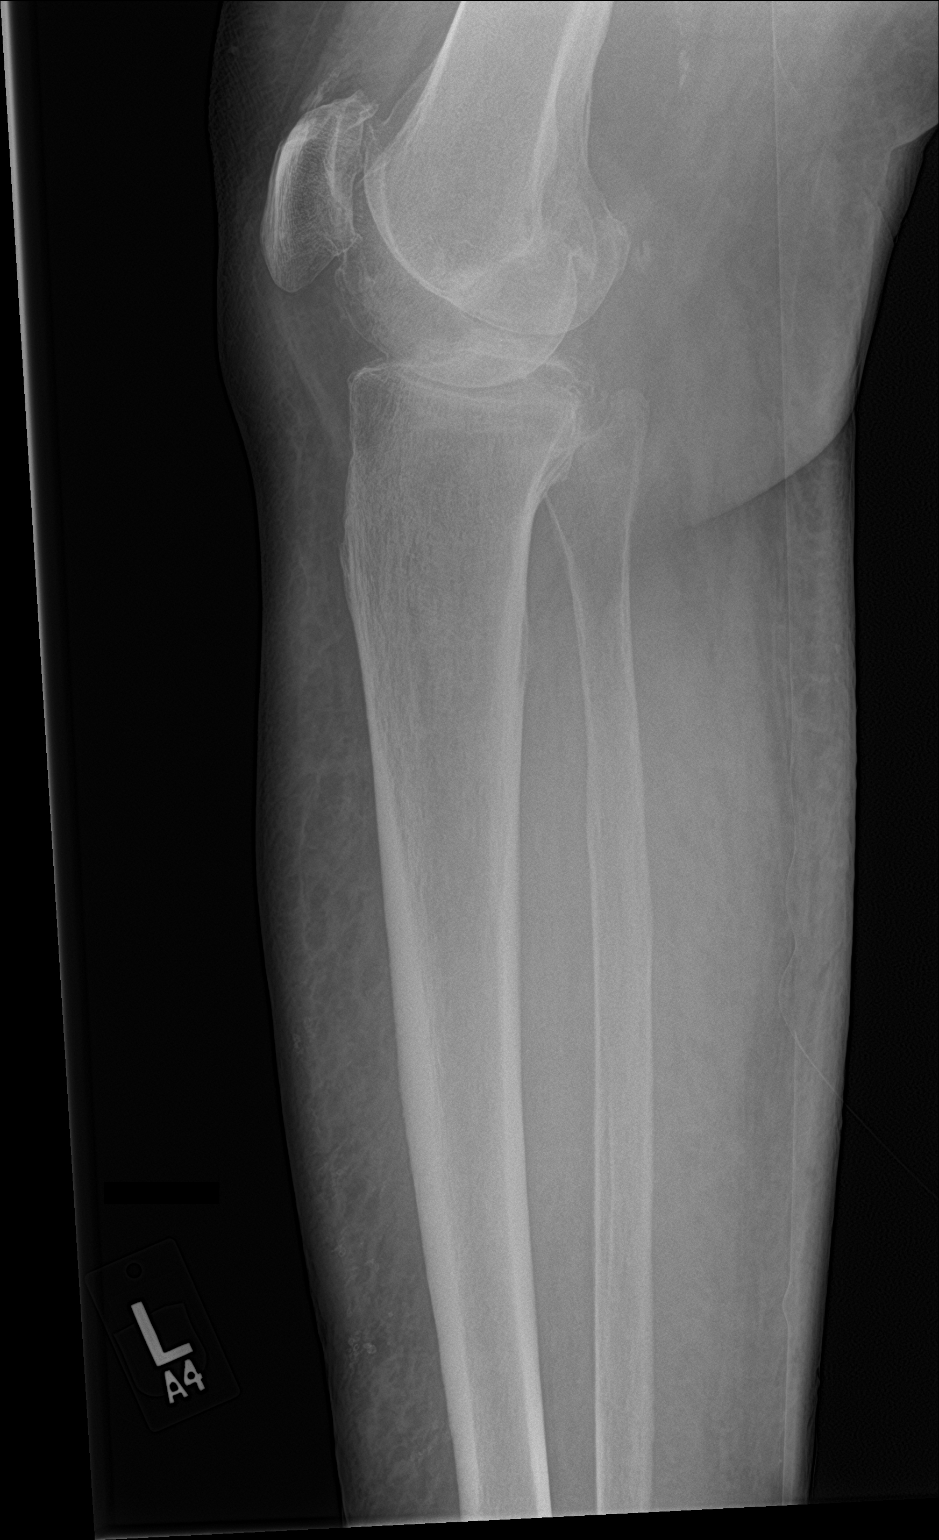

[tibia lat (2 of 2)]
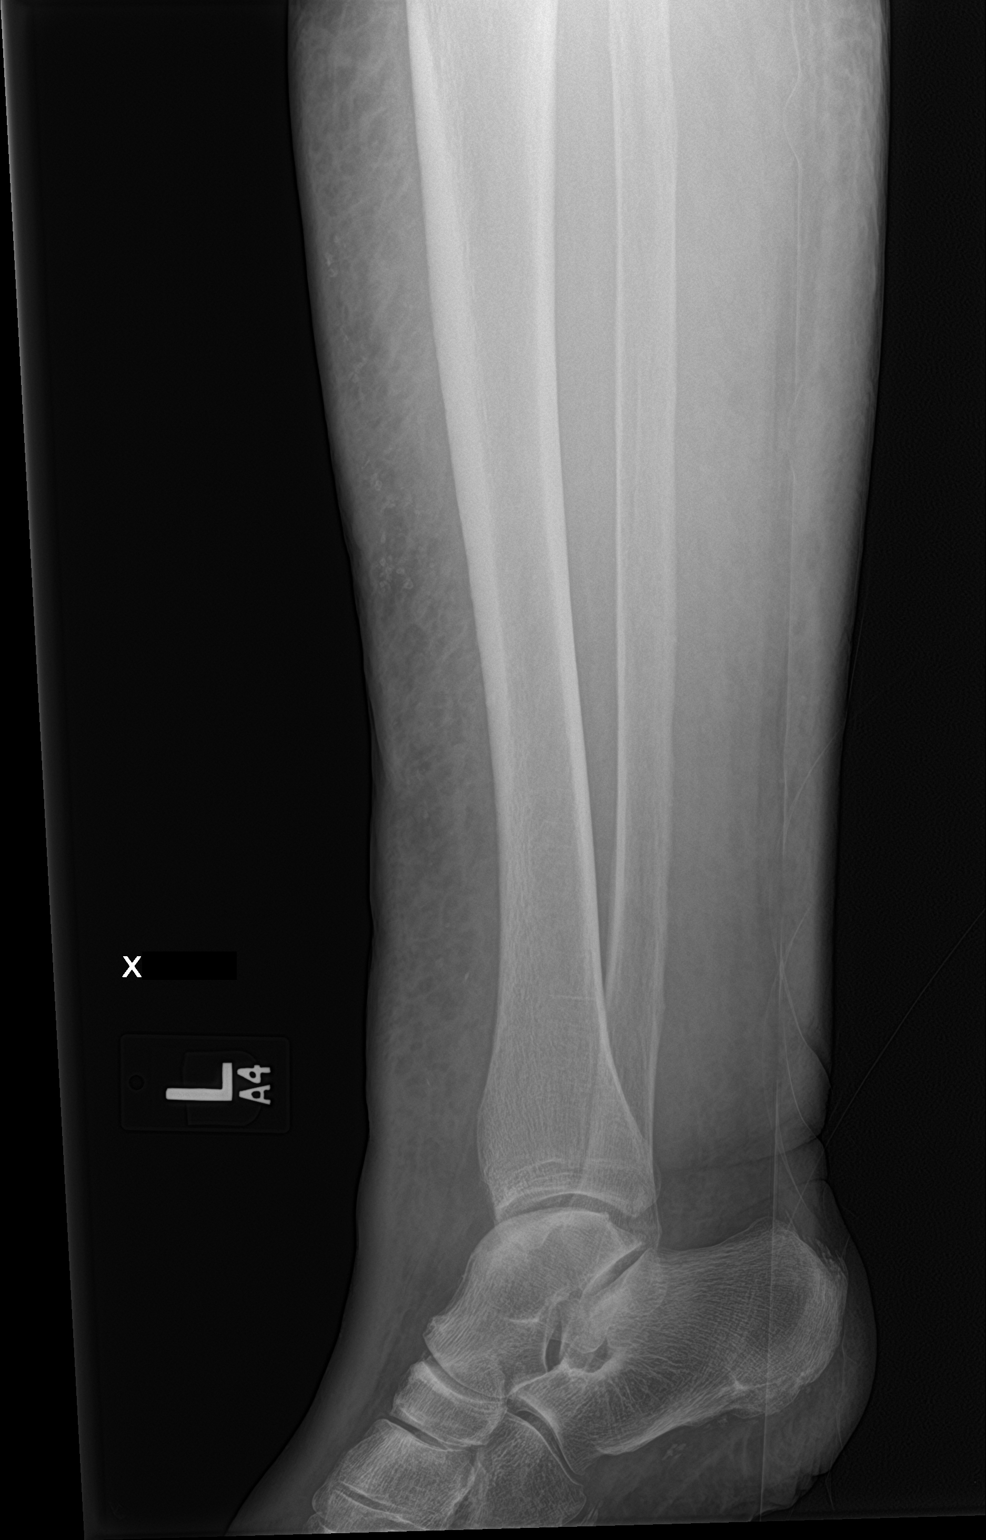

[4 of 4 positions shown; findings below may reference images not displayed]

FINDINGS: There is tricompartmental degenerative joint disease the left knee.
No fracture is seen. There is chondrocalcinosis present which may
indicate CPPD arthropathy. No acute fracture is noted involving the
tibia or fibula. There is diffuse soft tissue swelling present.
IMPRESSION: 1. No acute fracture.  Diffuse soft tissue swelling.
2. Tricompartmental degenerative joint disease of the left knee.
3. Chondrocalcinosis of the left knee may indicate CPPD arthropathy.

## 2018-08-15 IMAGING — DX DG CHEST 2V
2 series · 2 of 2 positions shown · non-contrast
Comparison: None.

CLINICAL DATA: Short of breath, hypertension, former smoking
history, recent fall

EXAM:
CHEST  2 VIEW

[chest lat]
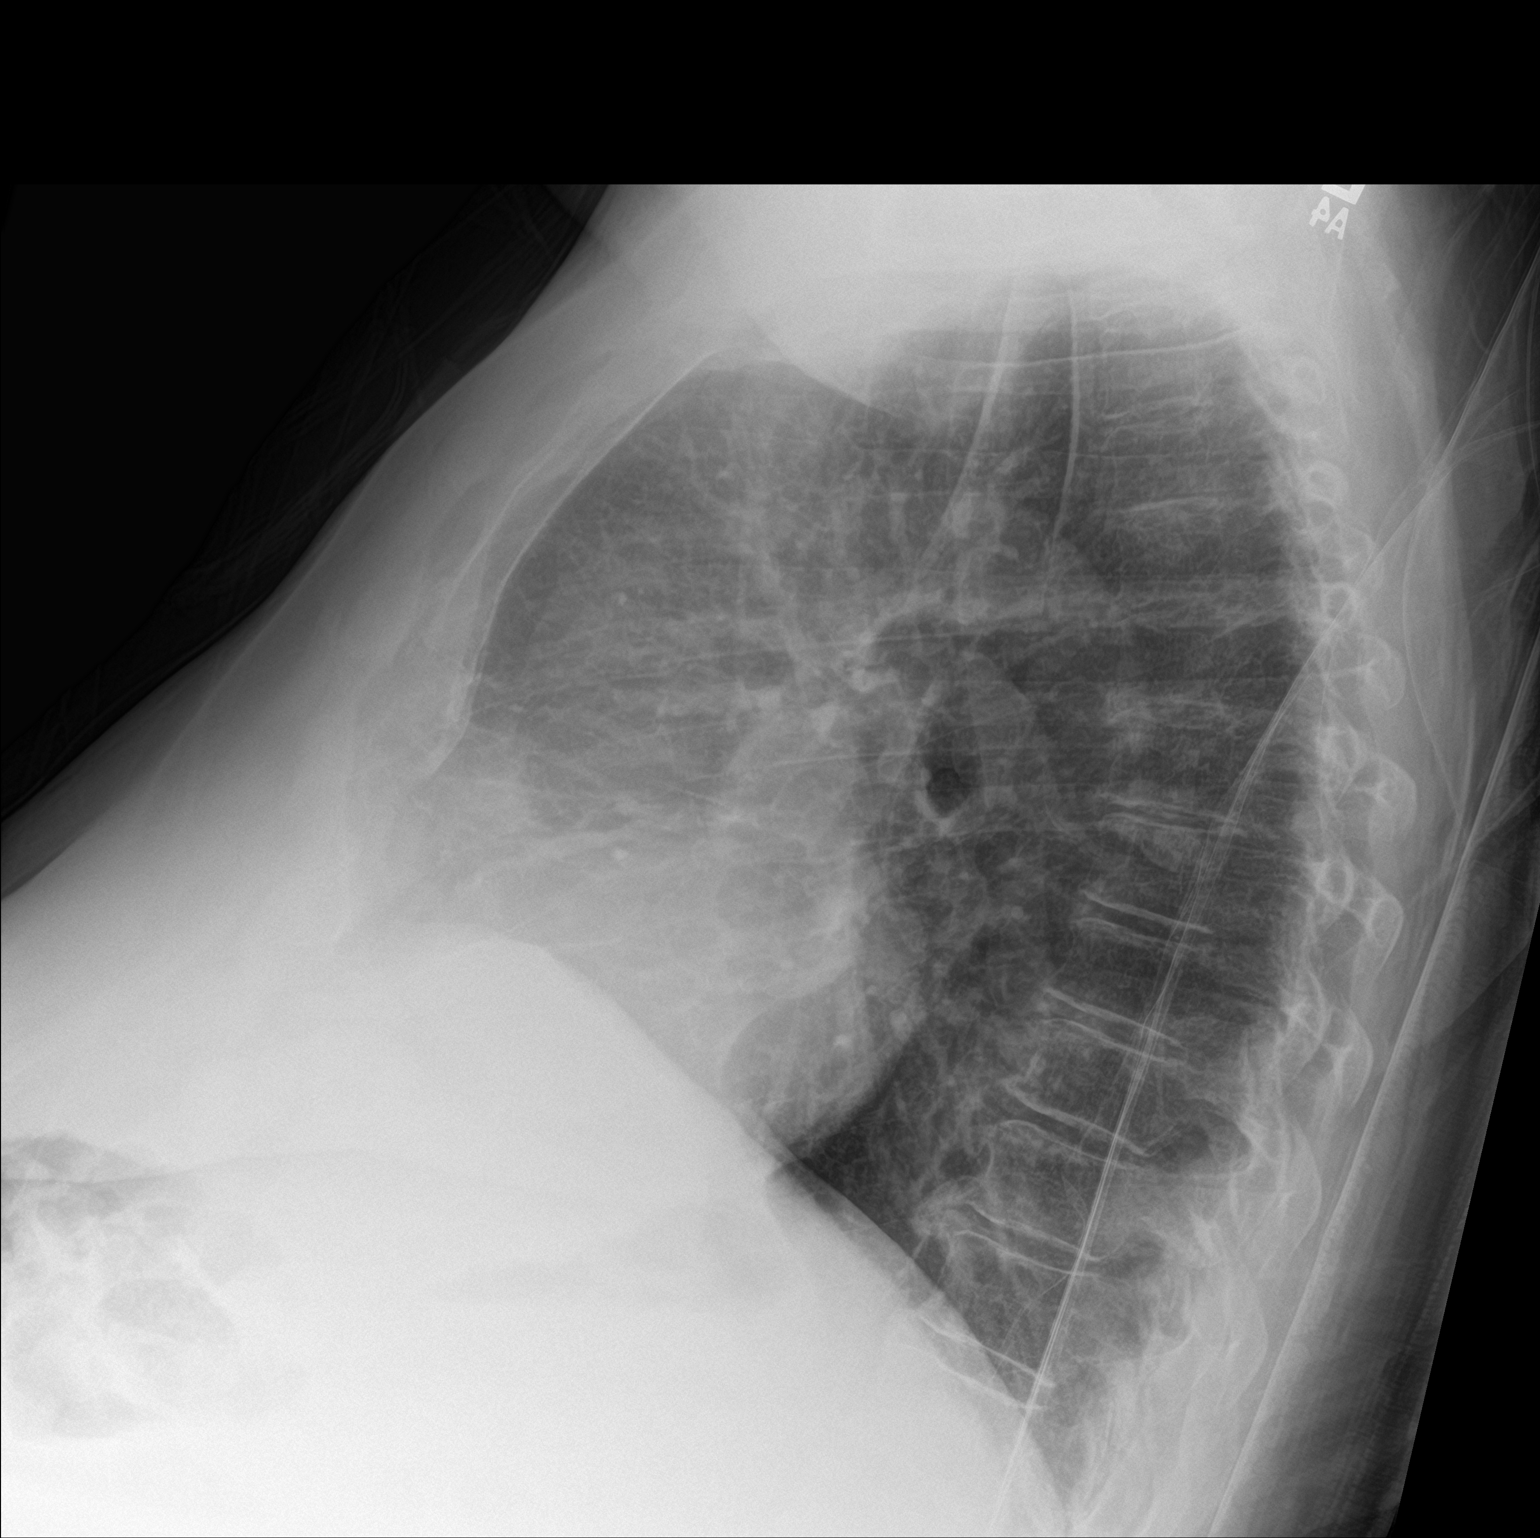

[chest ap]
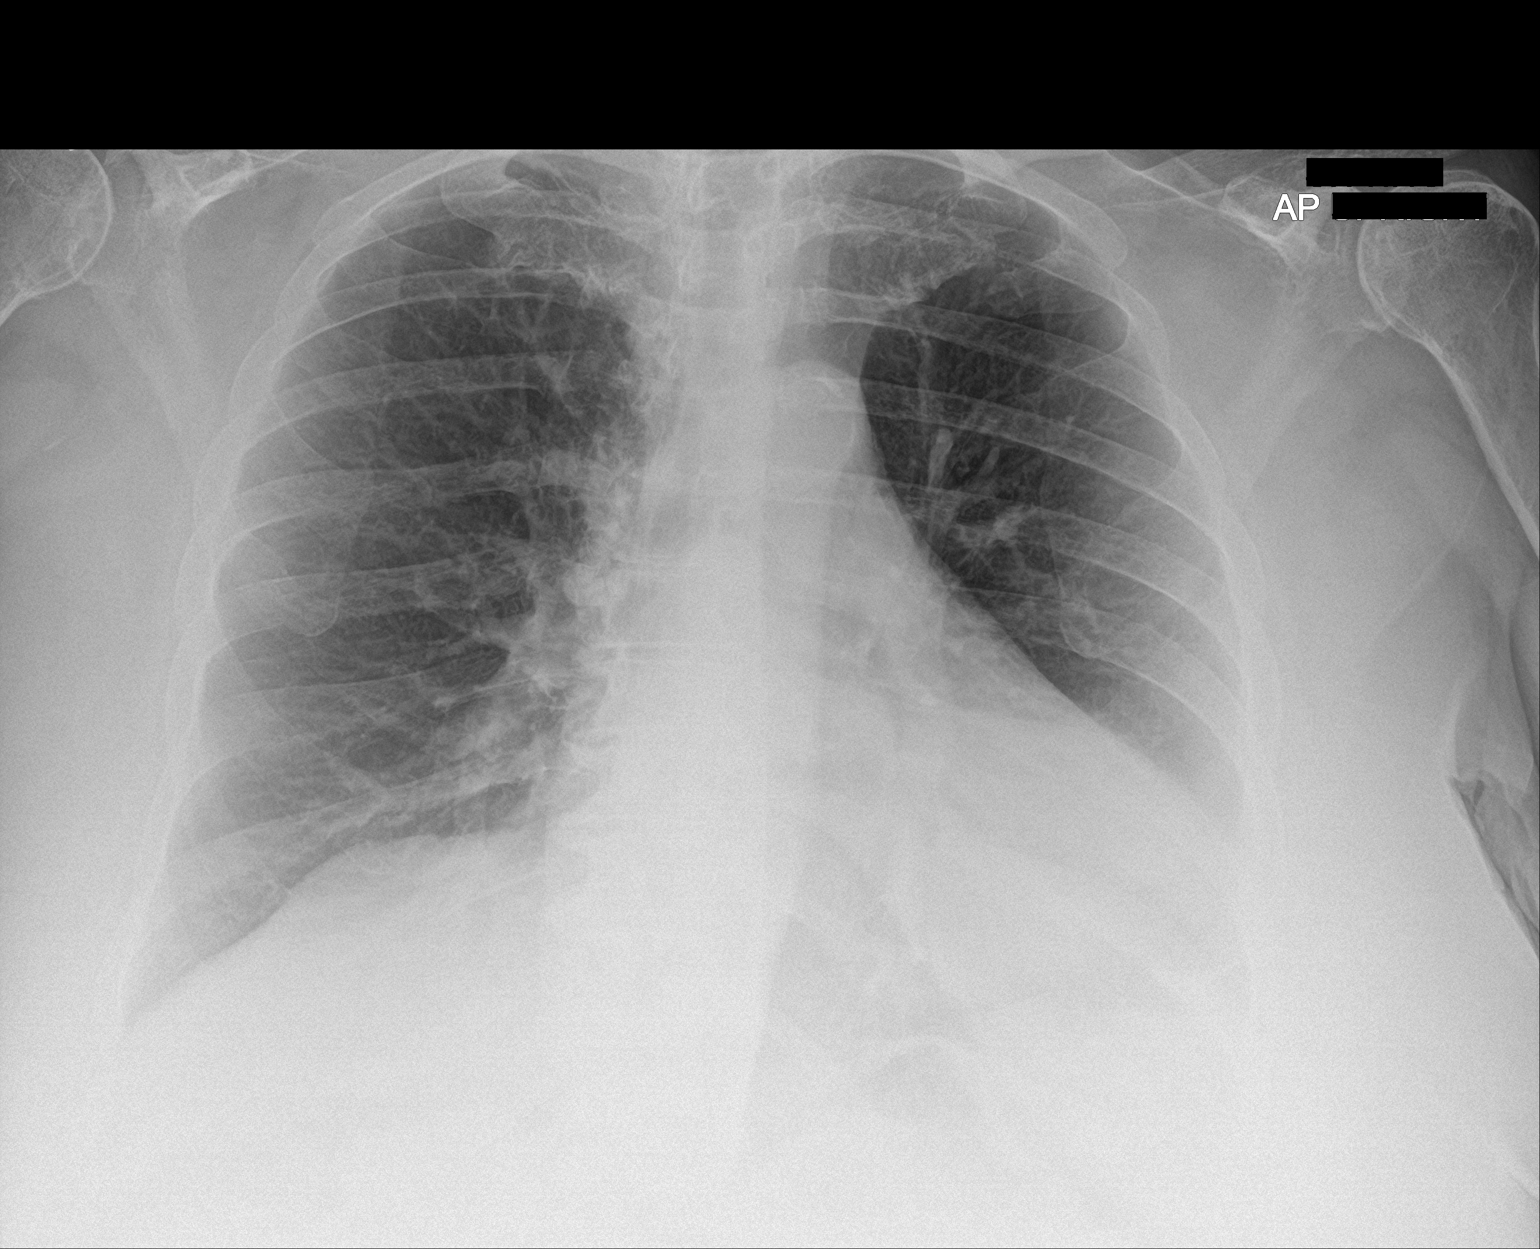

[2 of 2 positions shown; findings below may reference images not displayed]

FINDINGS: No active infiltrate or effusion is seen. There is no evidence of
pneumothorax. Mediastinal and hilar contours are unremarkable, and
moderate cardiomegaly is present. There are degenerative changes in
the mid to lower thoracic spine but no compression deformity is
seen. No rib fracture is seen.
IMPRESSION: 1. Moderate cardiomegaly.
2. No active lung disease.
3. No evidence of fracture.

## 2018-08-17 DIAGNOSIS — Z7982 Long term (current) use of aspirin: Secondary | ICD-10-CM | POA: Diagnosis not present

## 2018-08-17 DIAGNOSIS — N319 Neuromuscular dysfunction of bladder, unspecified: Secondary | ICD-10-CM | POA: Diagnosis not present

## 2018-08-17 DIAGNOSIS — Z87440 Personal history of urinary (tract) infections: Secondary | ICD-10-CM | POA: Diagnosis not present

## 2018-08-17 DIAGNOSIS — Z4800 Encounter for change or removal of nonsurgical wound dressing: Secondary | ICD-10-CM | POA: Diagnosis not present

## 2018-08-17 DIAGNOSIS — Z79891 Long term (current) use of opiate analgesic: Secondary | ICD-10-CM | POA: Diagnosis not present

## 2018-08-17 DIAGNOSIS — Z8631 Personal history of diabetic foot ulcer: Secondary | ICD-10-CM | POA: Diagnosis not present

## 2018-08-17 DIAGNOSIS — Z9181 History of falling: Secondary | ICD-10-CM | POA: Diagnosis not present

## 2018-08-17 DIAGNOSIS — R238 Other skin changes: Secondary | ICD-10-CM | POA: Diagnosis not present

## 2018-08-17 DIAGNOSIS — Z466 Encounter for fitting and adjustment of urinary device: Secondary | ICD-10-CM | POA: Diagnosis not present

## 2018-08-17 DIAGNOSIS — L89312 Pressure ulcer of right buttock, stage 2: Secondary | ICD-10-CM | POA: Diagnosis not present

## 2018-08-17 DIAGNOSIS — E1142 Type 2 diabetes mellitus with diabetic polyneuropathy: Secondary | ICD-10-CM | POA: Diagnosis not present

## 2018-08-17 DIAGNOSIS — N139 Obstructive and reflux uropathy, unspecified: Secondary | ICD-10-CM | POA: Diagnosis not present

## 2018-08-17 DIAGNOSIS — M5416 Radiculopathy, lumbar region: Secondary | ICD-10-CM | POA: Diagnosis not present

## 2018-08-17 DIAGNOSIS — R338 Other retention of urine: Secondary | ICD-10-CM | POA: Diagnosis not present

## 2018-08-17 DIAGNOSIS — I10 Essential (primary) hypertension: Secondary | ICD-10-CM | POA: Diagnosis not present

## 2018-08-17 DIAGNOSIS — Z794 Long term (current) use of insulin: Secondary | ICD-10-CM | POA: Diagnosis not present

## 2018-08-21 ENCOUNTER — Encounter (INDEPENDENT_AMBULATORY_CARE_PROVIDER_SITE_OTHER): Payer: Medicare Other | Admitting: Ophthalmology

## 2018-08-22 DIAGNOSIS — Z7982 Long term (current) use of aspirin: Secondary | ICD-10-CM | POA: Diagnosis not present

## 2018-08-22 DIAGNOSIS — N139 Obstructive and reflux uropathy, unspecified: Secondary | ICD-10-CM | POA: Diagnosis not present

## 2018-08-22 DIAGNOSIS — I10 Essential (primary) hypertension: Secondary | ICD-10-CM | POA: Diagnosis not present

## 2018-08-22 DIAGNOSIS — Z794 Long term (current) use of insulin: Secondary | ICD-10-CM | POA: Diagnosis not present

## 2018-08-22 DIAGNOSIS — Z4800 Encounter for change or removal of nonsurgical wound dressing: Secondary | ICD-10-CM | POA: Diagnosis not present

## 2018-08-22 DIAGNOSIS — Z79891 Long term (current) use of opiate analgesic: Secondary | ICD-10-CM | POA: Diagnosis not present

## 2018-08-22 DIAGNOSIS — L89312 Pressure ulcer of right buttock, stage 2: Secondary | ICD-10-CM | POA: Diagnosis not present

## 2018-08-22 DIAGNOSIS — M5416 Radiculopathy, lumbar region: Secondary | ICD-10-CM | POA: Diagnosis not present

## 2018-08-22 DIAGNOSIS — Z466 Encounter for fitting and adjustment of urinary device: Secondary | ICD-10-CM | POA: Diagnosis not present

## 2018-08-22 DIAGNOSIS — Z9181 History of falling: Secondary | ICD-10-CM | POA: Diagnosis not present

## 2018-08-22 DIAGNOSIS — E1142 Type 2 diabetes mellitus with diabetic polyneuropathy: Secondary | ICD-10-CM | POA: Diagnosis not present

## 2018-08-22 DIAGNOSIS — R338 Other retention of urine: Secondary | ICD-10-CM | POA: Diagnosis not present

## 2018-08-22 DIAGNOSIS — N319 Neuromuscular dysfunction of bladder, unspecified: Secondary | ICD-10-CM | POA: Diagnosis not present

## 2018-08-22 DIAGNOSIS — R238 Other skin changes: Secondary | ICD-10-CM | POA: Diagnosis not present

## 2018-08-22 DIAGNOSIS — Z87440 Personal history of urinary (tract) infections: Secondary | ICD-10-CM | POA: Diagnosis not present

## 2018-08-22 DIAGNOSIS — Z8631 Personal history of diabetic foot ulcer: Secondary | ICD-10-CM | POA: Diagnosis not present

## 2018-08-24 DIAGNOSIS — N319 Neuromuscular dysfunction of bladder, unspecified: Secondary | ICD-10-CM | POA: Diagnosis not present

## 2018-08-24 DIAGNOSIS — Z466 Encounter for fitting and adjustment of urinary device: Secondary | ICD-10-CM | POA: Diagnosis not present

## 2018-08-24 DIAGNOSIS — E1142 Type 2 diabetes mellitus with diabetic polyneuropathy: Secondary | ICD-10-CM | POA: Diagnosis not present

## 2018-08-24 DIAGNOSIS — Z794 Long term (current) use of insulin: Secondary | ICD-10-CM | POA: Diagnosis not present

## 2018-08-24 DIAGNOSIS — I10 Essential (primary) hypertension: Secondary | ICD-10-CM | POA: Diagnosis not present

## 2018-08-24 DIAGNOSIS — R338 Other retention of urine: Secondary | ICD-10-CM | POA: Diagnosis not present

## 2018-08-24 DIAGNOSIS — Z79891 Long term (current) use of opiate analgesic: Secondary | ICD-10-CM | POA: Diagnosis not present

## 2018-08-24 DIAGNOSIS — Z4800 Encounter for change or removal of nonsurgical wound dressing: Secondary | ICD-10-CM | POA: Diagnosis not present

## 2018-08-24 DIAGNOSIS — Z9181 History of falling: Secondary | ICD-10-CM | POA: Diagnosis not present

## 2018-08-24 DIAGNOSIS — L89312 Pressure ulcer of right buttock, stage 2: Secondary | ICD-10-CM | POA: Diagnosis not present

## 2018-08-24 DIAGNOSIS — Z87440 Personal history of urinary (tract) infections: Secondary | ICD-10-CM | POA: Diagnosis not present

## 2018-08-24 DIAGNOSIS — Z7982 Long term (current) use of aspirin: Secondary | ICD-10-CM | POA: Diagnosis not present

## 2018-08-24 DIAGNOSIS — N139 Obstructive and reflux uropathy, unspecified: Secondary | ICD-10-CM | POA: Diagnosis not present

## 2018-08-24 DIAGNOSIS — Z8631 Personal history of diabetic foot ulcer: Secondary | ICD-10-CM | POA: Diagnosis not present

## 2018-08-24 DIAGNOSIS — M5416 Radiculopathy, lumbar region: Secondary | ICD-10-CM | POA: Diagnosis not present

## 2018-08-24 DIAGNOSIS — R238 Other skin changes: Secondary | ICD-10-CM | POA: Diagnosis not present

## 2018-08-29 DIAGNOSIS — Z9181 History of falling: Secondary | ICD-10-CM | POA: Diagnosis not present

## 2018-08-29 DIAGNOSIS — Z79891 Long term (current) use of opiate analgesic: Secondary | ICD-10-CM | POA: Diagnosis not present

## 2018-08-29 DIAGNOSIS — L89312 Pressure ulcer of right buttock, stage 2: Secondary | ICD-10-CM | POA: Diagnosis not present

## 2018-08-29 DIAGNOSIS — R338 Other retention of urine: Secondary | ICD-10-CM | POA: Diagnosis not present

## 2018-08-29 DIAGNOSIS — I10 Essential (primary) hypertension: Secondary | ICD-10-CM | POA: Diagnosis not present

## 2018-08-29 DIAGNOSIS — R238 Other skin changes: Secondary | ICD-10-CM | POA: Diagnosis not present

## 2018-08-29 DIAGNOSIS — M5416 Radiculopathy, lumbar region: Secondary | ICD-10-CM | POA: Diagnosis not present

## 2018-08-29 DIAGNOSIS — E1142 Type 2 diabetes mellitus with diabetic polyneuropathy: Secondary | ICD-10-CM | POA: Diagnosis not present

## 2018-08-29 DIAGNOSIS — Z87440 Personal history of urinary (tract) infections: Secondary | ICD-10-CM | POA: Diagnosis not present

## 2018-08-29 DIAGNOSIS — N139 Obstructive and reflux uropathy, unspecified: Secondary | ICD-10-CM | POA: Diagnosis not present

## 2018-08-29 DIAGNOSIS — Z794 Long term (current) use of insulin: Secondary | ICD-10-CM | POA: Diagnosis not present

## 2018-08-29 DIAGNOSIS — G894 Chronic pain syndrome: Secondary | ICD-10-CM | POA: Diagnosis not present

## 2018-08-29 DIAGNOSIS — M47816 Spondylosis without myelopathy or radiculopathy, lumbar region: Secondary | ICD-10-CM | POA: Diagnosis not present

## 2018-08-29 DIAGNOSIS — Z8631 Personal history of diabetic foot ulcer: Secondary | ICD-10-CM | POA: Diagnosis not present

## 2018-08-29 DIAGNOSIS — N319 Neuromuscular dysfunction of bladder, unspecified: Secondary | ICD-10-CM | POA: Diagnosis not present

## 2018-08-29 DIAGNOSIS — Z7982 Long term (current) use of aspirin: Secondary | ICD-10-CM | POA: Diagnosis not present

## 2018-08-29 DIAGNOSIS — Z79899 Other long term (current) drug therapy: Secondary | ICD-10-CM | POA: Diagnosis not present

## 2018-08-29 DIAGNOSIS — M169 Osteoarthritis of hip, unspecified: Secondary | ICD-10-CM | POA: Diagnosis not present

## 2018-08-29 DIAGNOSIS — Z4800 Encounter for change or removal of nonsurgical wound dressing: Secondary | ICD-10-CM | POA: Diagnosis not present

## 2018-08-29 DIAGNOSIS — Z466 Encounter for fitting and adjustment of urinary device: Secondary | ICD-10-CM | POA: Diagnosis not present

## 2018-09-04 ENCOUNTER — Encounter (INDEPENDENT_AMBULATORY_CARE_PROVIDER_SITE_OTHER): Payer: Medicare Other | Admitting: Ophthalmology

## 2018-09-06 DIAGNOSIS — E1142 Type 2 diabetes mellitus with diabetic polyneuropathy: Secondary | ICD-10-CM | POA: Diagnosis not present

## 2018-09-06 DIAGNOSIS — Z79891 Long term (current) use of opiate analgesic: Secondary | ICD-10-CM | POA: Diagnosis not present

## 2018-09-06 DIAGNOSIS — M5416 Radiculopathy, lumbar region: Secondary | ICD-10-CM | POA: Diagnosis not present

## 2018-09-06 DIAGNOSIS — Z9181 History of falling: Secondary | ICD-10-CM | POA: Diagnosis not present

## 2018-09-06 DIAGNOSIS — R238 Other skin changes: Secondary | ICD-10-CM | POA: Diagnosis not present

## 2018-09-06 DIAGNOSIS — Z87440 Personal history of urinary (tract) infections: Secondary | ICD-10-CM | POA: Diagnosis not present

## 2018-09-06 DIAGNOSIS — Z4800 Encounter for change or removal of nonsurgical wound dressing: Secondary | ICD-10-CM | POA: Diagnosis not present

## 2018-09-06 DIAGNOSIS — N319 Neuromuscular dysfunction of bladder, unspecified: Secondary | ICD-10-CM | POA: Diagnosis not present

## 2018-09-06 DIAGNOSIS — R338 Other retention of urine: Secondary | ICD-10-CM | POA: Diagnosis not present

## 2018-09-06 DIAGNOSIS — L89312 Pressure ulcer of right buttock, stage 2: Secondary | ICD-10-CM | POA: Diagnosis not present

## 2018-09-06 DIAGNOSIS — Z8631 Personal history of diabetic foot ulcer: Secondary | ICD-10-CM | POA: Diagnosis not present

## 2018-09-06 DIAGNOSIS — Z466 Encounter for fitting and adjustment of urinary device: Secondary | ICD-10-CM | POA: Diagnosis not present

## 2018-09-06 DIAGNOSIS — Z7982 Long term (current) use of aspirin: Secondary | ICD-10-CM | POA: Diagnosis not present

## 2018-09-06 DIAGNOSIS — N139 Obstructive and reflux uropathy, unspecified: Secondary | ICD-10-CM | POA: Diagnosis not present

## 2018-09-06 DIAGNOSIS — Z794 Long term (current) use of insulin: Secondary | ICD-10-CM | POA: Diagnosis not present

## 2018-09-06 DIAGNOSIS — I10 Essential (primary) hypertension: Secondary | ICD-10-CM | POA: Diagnosis not present

## 2018-09-08 DIAGNOSIS — M5416 Radiculopathy, lumbar region: Secondary | ICD-10-CM | POA: Diagnosis not present

## 2018-09-08 DIAGNOSIS — R338 Other retention of urine: Secondary | ICD-10-CM | POA: Diagnosis not present

## 2018-09-08 DIAGNOSIS — Z466 Encounter for fitting and adjustment of urinary device: Secondary | ICD-10-CM | POA: Diagnosis not present

## 2018-09-08 DIAGNOSIS — Z8631 Personal history of diabetic foot ulcer: Secondary | ICD-10-CM | POA: Diagnosis not present

## 2018-09-08 DIAGNOSIS — N139 Obstructive and reflux uropathy, unspecified: Secondary | ICD-10-CM | POA: Diagnosis not present

## 2018-09-08 DIAGNOSIS — Z7982 Long term (current) use of aspirin: Secondary | ICD-10-CM | POA: Diagnosis not present

## 2018-09-08 DIAGNOSIS — L89312 Pressure ulcer of right buttock, stage 2: Secondary | ICD-10-CM | POA: Diagnosis not present

## 2018-09-08 DIAGNOSIS — Z79891 Long term (current) use of opiate analgesic: Secondary | ICD-10-CM | POA: Diagnosis not present

## 2018-09-08 DIAGNOSIS — I10 Essential (primary) hypertension: Secondary | ICD-10-CM | POA: Diagnosis not present

## 2018-09-08 DIAGNOSIS — Z9181 History of falling: Secondary | ICD-10-CM | POA: Diagnosis not present

## 2018-09-08 DIAGNOSIS — Z87440 Personal history of urinary (tract) infections: Secondary | ICD-10-CM | POA: Diagnosis not present

## 2018-09-08 DIAGNOSIS — R238 Other skin changes: Secondary | ICD-10-CM | POA: Diagnosis not present

## 2018-09-08 DIAGNOSIS — Z794 Long term (current) use of insulin: Secondary | ICD-10-CM | POA: Diagnosis not present

## 2018-09-08 DIAGNOSIS — Z4800 Encounter for change or removal of nonsurgical wound dressing: Secondary | ICD-10-CM | POA: Diagnosis not present

## 2018-09-08 DIAGNOSIS — E1142 Type 2 diabetes mellitus with diabetic polyneuropathy: Secondary | ICD-10-CM | POA: Diagnosis not present

## 2018-09-08 DIAGNOSIS — N319 Neuromuscular dysfunction of bladder, unspecified: Secondary | ICD-10-CM | POA: Diagnosis not present

## 2018-09-12 DIAGNOSIS — Z794 Long term (current) use of insulin: Secondary | ICD-10-CM | POA: Diagnosis not present

## 2018-09-12 DIAGNOSIS — Z79891 Long term (current) use of opiate analgesic: Secondary | ICD-10-CM | POA: Diagnosis not present

## 2018-09-12 DIAGNOSIS — Z4800 Encounter for change or removal of nonsurgical wound dressing: Secondary | ICD-10-CM | POA: Diagnosis not present

## 2018-09-12 DIAGNOSIS — I10 Essential (primary) hypertension: Secondary | ICD-10-CM | POA: Diagnosis not present

## 2018-09-12 DIAGNOSIS — Z7982 Long term (current) use of aspirin: Secondary | ICD-10-CM | POA: Diagnosis not present

## 2018-09-12 DIAGNOSIS — Z8631 Personal history of diabetic foot ulcer: Secondary | ICD-10-CM | POA: Diagnosis not present

## 2018-09-12 DIAGNOSIS — N139 Obstructive and reflux uropathy, unspecified: Secondary | ICD-10-CM | POA: Diagnosis not present

## 2018-09-12 DIAGNOSIS — R338 Other retention of urine: Secondary | ICD-10-CM | POA: Diagnosis not present

## 2018-09-12 DIAGNOSIS — Z9181 History of falling: Secondary | ICD-10-CM | POA: Diagnosis not present

## 2018-09-12 DIAGNOSIS — R238 Other skin changes: Secondary | ICD-10-CM | POA: Diagnosis not present

## 2018-09-12 DIAGNOSIS — N319 Neuromuscular dysfunction of bladder, unspecified: Secondary | ICD-10-CM | POA: Diagnosis not present

## 2018-09-12 DIAGNOSIS — M5416 Radiculopathy, lumbar region: Secondary | ICD-10-CM | POA: Diagnosis not present

## 2018-09-12 DIAGNOSIS — Z87440 Personal history of urinary (tract) infections: Secondary | ICD-10-CM | POA: Diagnosis not present

## 2018-09-12 DIAGNOSIS — E1142 Type 2 diabetes mellitus with diabetic polyneuropathy: Secondary | ICD-10-CM | POA: Diagnosis not present

## 2018-09-12 DIAGNOSIS — N390 Urinary tract infection, site not specified: Secondary | ICD-10-CM | POA: Diagnosis not present

## 2018-09-12 DIAGNOSIS — Z466 Encounter for fitting and adjustment of urinary device: Secondary | ICD-10-CM | POA: Diagnosis not present

## 2018-09-12 DIAGNOSIS — L89312 Pressure ulcer of right buttock, stage 2: Secondary | ICD-10-CM | POA: Diagnosis not present

## 2018-09-14 DIAGNOSIS — N139 Obstructive and reflux uropathy, unspecified: Secondary | ICD-10-CM | POA: Diagnosis not present

## 2018-09-14 DIAGNOSIS — Z466 Encounter for fitting and adjustment of urinary device: Secondary | ICD-10-CM | POA: Diagnosis not present

## 2018-09-14 DIAGNOSIS — N319 Neuromuscular dysfunction of bladder, unspecified: Secondary | ICD-10-CM | POA: Diagnosis not present

## 2018-09-14 DIAGNOSIS — Z79891 Long term (current) use of opiate analgesic: Secondary | ICD-10-CM | POA: Diagnosis not present

## 2018-09-14 DIAGNOSIS — Z794 Long term (current) use of insulin: Secondary | ICD-10-CM | POA: Diagnosis not present

## 2018-09-14 DIAGNOSIS — Z7982 Long term (current) use of aspirin: Secondary | ICD-10-CM | POA: Diagnosis not present

## 2018-09-14 DIAGNOSIS — E1142 Type 2 diabetes mellitus with diabetic polyneuropathy: Secondary | ICD-10-CM | POA: Diagnosis not present

## 2018-09-14 DIAGNOSIS — Z4800 Encounter for change or removal of nonsurgical wound dressing: Secondary | ICD-10-CM | POA: Diagnosis not present

## 2018-09-14 DIAGNOSIS — L89312 Pressure ulcer of right buttock, stage 2: Secondary | ICD-10-CM | POA: Diagnosis not present

## 2018-09-14 DIAGNOSIS — R338 Other retention of urine: Secondary | ICD-10-CM | POA: Diagnosis not present

## 2018-09-14 DIAGNOSIS — M5416 Radiculopathy, lumbar region: Secondary | ICD-10-CM | POA: Diagnosis not present

## 2018-09-14 DIAGNOSIS — R238 Other skin changes: Secondary | ICD-10-CM | POA: Diagnosis not present

## 2018-09-14 DIAGNOSIS — Z8631 Personal history of diabetic foot ulcer: Secondary | ICD-10-CM | POA: Diagnosis not present

## 2018-09-14 DIAGNOSIS — Z87440 Personal history of urinary (tract) infections: Secondary | ICD-10-CM | POA: Diagnosis not present

## 2018-09-14 DIAGNOSIS — I10 Essential (primary) hypertension: Secondary | ICD-10-CM | POA: Diagnosis not present

## 2018-09-14 DIAGNOSIS — Z9181 History of falling: Secondary | ICD-10-CM | POA: Diagnosis not present

## 2018-09-15 ENCOUNTER — Other Ambulatory Visit (HOSPITAL_COMMUNITY)
Admission: RE | Admit: 2018-09-15 | Discharge: 2018-09-15 | Disposition: A | Discharge status: Home / Self Care | Payer: Medicare Other | Source: Ambulatory Visit | Attending: Family Medicine | Admitting: Family Medicine

## 2018-09-15 DIAGNOSIS — A419 Sepsis, unspecified organism: Secondary | ICD-10-CM | POA: Diagnosis not present

## 2018-09-15 DIAGNOSIS — J9601 Acute respiratory failure with hypoxia: Secondary | ICD-10-CM | POA: Diagnosis not present

## 2018-09-15 DIAGNOSIS — D649 Anemia, unspecified: Secondary | ICD-10-CM | POA: Diagnosis not present

## 2018-09-15 DIAGNOSIS — R918 Other nonspecific abnormal finding of lung field: Secondary | ICD-10-CM | POA: Diagnosis not present

## 2018-09-15 DIAGNOSIS — N3001 Acute cystitis with hematuria: Secondary | ICD-10-CM | POA: Diagnosis not present

## 2018-09-15 DIAGNOSIS — Z794 Long term (current) use of insulin: Secondary | ICD-10-CM | POA: Diagnosis not present

## 2018-09-15 DIAGNOSIS — N390 Urinary tract infection, site not specified: Secondary | ICD-10-CM | POA: Diagnosis not present

## 2018-09-15 DIAGNOSIS — N179 Acute kidney failure, unspecified: Secondary | ICD-10-CM | POA: Diagnosis not present

## 2018-09-15 DIAGNOSIS — J189 Pneumonia, unspecified organism: Secondary | ICD-10-CM | POA: Diagnosis not present

## 2018-09-15 DIAGNOSIS — M358 Other specified systemic involvement of connective tissue: Secondary | ICD-10-CM | POA: Diagnosis not present

## 2018-09-15 DIAGNOSIS — N184 Chronic kidney disease, stage 4 (severe): Secondary | ICD-10-CM | POA: Diagnosis not present

## 2018-09-15 DIAGNOSIS — Z87891 Personal history of nicotine dependence: Secondary | ICD-10-CM | POA: Diagnosis not present

## 2018-09-15 DIAGNOSIS — I5030 Unspecified diastolic (congestive) heart failure: Secondary | ICD-10-CM | POA: Diagnosis not present

## 2018-09-15 DIAGNOSIS — Z8673 Personal history of transient ischemic attack (TIA), and cerebral infarction without residual deficits: Secondary | ICD-10-CM | POA: Diagnosis not present

## 2018-09-15 DIAGNOSIS — R7989 Other specified abnormal findings of blood chemistry: Secondary | ICD-10-CM | POA: Diagnosis not present

## 2018-09-15 DIAGNOSIS — Z7982 Long term (current) use of aspirin: Secondary | ICD-10-CM | POA: Diagnosis not present

## 2018-09-15 DIAGNOSIS — E1140 Type 2 diabetes mellitus with diabetic neuropathy, unspecified: Secondary | ICD-10-CM | POA: Diagnosis not present

## 2018-09-15 DIAGNOSIS — I471 Supraventricular tachycardia: Secondary | ICD-10-CM | POA: Diagnosis not present

## 2018-09-15 DIAGNOSIS — R000 Tachycardia, unspecified: Secondary | ICD-10-CM | POA: Diagnosis not present

## 2018-09-15 DIAGNOSIS — T83511A Infection and inflammatory reaction due to indwelling urethral catheter, initial encounter: Secondary | ICD-10-CM | POA: Diagnosis not present

## 2018-09-15 DIAGNOSIS — I517 Cardiomegaly: Secondary | ICD-10-CM | POA: Diagnosis not present

## 2018-09-15 DIAGNOSIS — J9811 Atelectasis: Secondary | ICD-10-CM | POA: Diagnosis not present

## 2018-09-15 DIAGNOSIS — E1165 Type 2 diabetes mellitus with hyperglycemia: Secondary | ICD-10-CM | POA: Diagnosis not present

## 2018-09-15 DIAGNOSIS — E860 Dehydration: Secondary | ICD-10-CM | POA: Diagnosis not present

## 2018-09-15 DIAGNOSIS — E876 Hypokalemia: Secondary | ICD-10-CM | POA: Diagnosis not present

## 2018-09-15 DIAGNOSIS — I10 Essential (primary) hypertension: Secondary | ICD-10-CM | POA: Diagnosis not present

## 2018-09-15 DIAGNOSIS — G894 Chronic pain syndrome: Secondary | ICD-10-CM | POA: Diagnosis not present

## 2018-09-15 DIAGNOSIS — E872 Acidosis: Secondary | ICD-10-CM | POA: Diagnosis not present

## 2018-09-15 DIAGNOSIS — R0902 Hypoxemia: Secondary | ICD-10-CM | POA: Diagnosis not present

## 2018-09-15 DIAGNOSIS — I5033 Acute on chronic diastolic (congestive) heart failure: Secondary | ICD-10-CM | POA: Diagnosis not present

## 2018-09-15 DIAGNOSIS — M6281 Muscle weakness (generalized): Secondary | ICD-10-CM | POA: Diagnosis not present

## 2018-09-15 DIAGNOSIS — Z7401 Bed confinement status: Secondary | ICD-10-CM | POA: Diagnosis not present

## 2018-09-15 DIAGNOSIS — E119 Type 2 diabetes mellitus without complications: Secondary | ICD-10-CM | POA: Diagnosis not present

## 2018-09-15 DIAGNOSIS — I130 Hypertensive heart and chronic kidney disease with heart failure and stage 1 through stage 4 chronic kidney disease, or unspecified chronic kidney disease: Secondary | ICD-10-CM | POA: Diagnosis not present

## 2018-09-15 DIAGNOSIS — N319 Neuromuscular dysfunction of bladder, unspecified: Secondary | ICD-10-CM | POA: Diagnosis not present

## 2018-09-15 DIAGNOSIS — E875 Hyperkalemia: Secondary | ICD-10-CM | POA: Diagnosis not present

## 2018-09-15 DIAGNOSIS — R2689 Other abnormalities of gait and mobility: Secondary | ICD-10-CM | POA: Diagnosis not present

## 2018-09-15 DIAGNOSIS — N189 Chronic kidney disease, unspecified: Secondary | ICD-10-CM | POA: Diagnosis not present

## 2018-09-15 DIAGNOSIS — I509 Heart failure, unspecified: Secondary | ICD-10-CM | POA: Diagnosis not present

## 2018-09-15 DIAGNOSIS — J90 Pleural effusion, not elsewhere classified: Secondary | ICD-10-CM | POA: Diagnosis not present

## 2018-09-15 DIAGNOSIS — J9691 Respiratory failure, unspecified with hypoxia: Secondary | ICD-10-CM | POA: Diagnosis not present

## 2018-09-15 DIAGNOSIS — T83511D Infection and inflammatory reaction due to indwelling urethral catheter, subsequent encounter: Secondary | ICD-10-CM | POA: Diagnosis not present

## 2018-09-15 DIAGNOSIS — I110 Hypertensive heart disease with heart failure: Secondary | ICD-10-CM | POA: Diagnosis not present

## 2018-09-15 DIAGNOSIS — I440 Atrioventricular block, first degree: Secondary | ICD-10-CM | POA: Diagnosis not present

## 2018-09-15 DIAGNOSIS — J811 Chronic pulmonary edema: Secondary | ICD-10-CM | POA: Diagnosis not present

## 2018-09-15 DIAGNOSIS — E1122 Type 2 diabetes mellitus with diabetic chronic kidney disease: Secondary | ICD-10-CM | POA: Diagnosis not present

## 2018-09-15 DIAGNOSIS — B961 Klebsiella pneumoniae [K. pneumoniae] as the cause of diseases classified elsewhere: Secondary | ICD-10-CM | POA: Diagnosis not present

## 2018-09-15 LAB — COMPREHENSIVE METABOLIC PANEL
ALT: 10 U/L (ref 0–44)
AST: 9 U/L — ABNORMAL LOW (ref 15–41)
Albumin: 2.7 g/dL — ABNORMAL LOW (ref 3.5–5.0)
Alkaline Phosphatase: 65 U/L (ref 38–126)
Anion gap: 14 (ref 5–15)
BUN: 82 mg/dL — ABNORMAL HIGH (ref 8–23)
CO2: 21 mmol/L — ABNORMAL LOW (ref 22–32)
Calcium: 8.7 mg/dL — ABNORMAL LOW (ref 8.9–10.3)
Chloride: 102 mmol/L (ref 98–111)
Creatinine, Ser: 5.14 mg/dL — ABNORMAL HIGH (ref 0.44–1.00)
GFR calc Af Amer: 9 mL/min — ABNORMAL LOW (ref >60)
GFR calc non Af Amer: 8 mL/min — ABNORMAL LOW (ref >60)
Glucose, Bld: 188 mg/dL — ABNORMAL HIGH (ref 70–99)
Potassium: 6 mmol/L — ABNORMAL HIGH (ref 3.5–5.1)
Sodium: 137 mmol/L (ref 135–145)
Total Bilirubin: 0.4 mg/dL (ref 0.3–1.2)
Total Protein: 6.1 g/dL — ABNORMAL LOW (ref 6.5–8.1)

## 2018-09-16 DIAGNOSIS — J189 Pneumonia, unspecified organism: Secondary | ICD-10-CM | POA: Diagnosis not present

## 2018-09-16 DIAGNOSIS — N179 Acute kidney failure, unspecified: Secondary | ICD-10-CM | POA: Diagnosis not present

## 2018-09-16 DIAGNOSIS — N390 Urinary tract infection, site not specified: Secondary | ICD-10-CM | POA: Diagnosis not present

## 2018-09-16 DIAGNOSIS — A419 Sepsis, unspecified organism: Secondary | ICD-10-CM | POA: Diagnosis not present

## 2018-09-17 DIAGNOSIS — J189 Pneumonia, unspecified organism: Secondary | ICD-10-CM | POA: Diagnosis not present

## 2018-09-18 DIAGNOSIS — M358 Other specified systemic involvement of connective tissue: Secondary | ICD-10-CM | POA: Diagnosis not present

## 2018-09-18 DIAGNOSIS — I509 Heart failure, unspecified: Secondary | ICD-10-CM | POA: Diagnosis not present

## 2018-09-18 DIAGNOSIS — N179 Acute kidney failure, unspecified: Secondary | ICD-10-CM | POA: Diagnosis not present

## 2018-09-18 DIAGNOSIS — E876 Hypokalemia: Secondary | ICD-10-CM | POA: Diagnosis not present

## 2018-09-18 DIAGNOSIS — I517 Cardiomegaly: Secondary | ICD-10-CM | POA: Diagnosis not present

## 2018-09-18 DIAGNOSIS — R7989 Other specified abnormal findings of blood chemistry: Secondary | ICD-10-CM | POA: Diagnosis not present

## 2018-09-18 DIAGNOSIS — J189 Pneumonia, unspecified organism: Secondary | ICD-10-CM | POA: Diagnosis not present

## 2018-09-18 DIAGNOSIS — I471 Supraventricular tachycardia: Secondary | ICD-10-CM | POA: Diagnosis not present

## 2018-09-18 DIAGNOSIS — N390 Urinary tract infection, site not specified: Secondary | ICD-10-CM | POA: Diagnosis not present

## 2018-09-19 DIAGNOSIS — E875 Hyperkalemia: Secondary | ICD-10-CM | POA: Diagnosis not present

## 2018-09-19 DIAGNOSIS — N179 Acute kidney failure, unspecified: Secondary | ICD-10-CM | POA: Diagnosis not present

## 2018-09-19 DIAGNOSIS — I10 Essential (primary) hypertension: Secondary | ICD-10-CM | POA: Diagnosis not present

## 2018-09-19 DIAGNOSIS — E119 Type 2 diabetes mellitus without complications: Secondary | ICD-10-CM | POA: Diagnosis not present

## 2018-09-20 DIAGNOSIS — N189 Chronic kidney disease, unspecified: Secondary | ICD-10-CM | POA: Diagnosis not present

## 2018-09-20 DIAGNOSIS — Z8673 Personal history of transient ischemic attack (TIA), and cerebral infarction without residual deficits: Secondary | ICD-10-CM | POA: Diagnosis not present

## 2018-09-20 DIAGNOSIS — I10 Essential (primary) hypertension: Secondary | ICD-10-CM | POA: Diagnosis not present

## 2018-09-20 DIAGNOSIS — N179 Acute kidney failure, unspecified: Secondary | ICD-10-CM | POA: Diagnosis not present

## 2018-09-21 DIAGNOSIS — N189 Chronic kidney disease, unspecified: Secondary | ICD-10-CM | POA: Diagnosis not present

## 2018-09-22 DIAGNOSIS — J189 Pneumonia, unspecified organism: Secondary | ICD-10-CM | POA: Diagnosis not present

## 2018-09-22 DIAGNOSIS — J9691 Respiratory failure, unspecified with hypoxia: Secondary | ICD-10-CM | POA: Diagnosis not present

## 2018-09-22 DIAGNOSIS — N179 Acute kidney failure, unspecified: Secondary | ICD-10-CM | POA: Diagnosis not present

## 2018-09-22 DIAGNOSIS — I5033 Acute on chronic diastolic (congestive) heart failure: Secondary | ICD-10-CM | POA: Diagnosis not present

## 2018-09-22 DIAGNOSIS — N189 Chronic kidney disease, unspecified: Secondary | ICD-10-CM | POA: Diagnosis not present

## 2018-09-22 DIAGNOSIS — J9601 Acute respiratory failure with hypoxia: Secondary | ICD-10-CM | POA: Diagnosis not present

## 2018-09-22 DIAGNOSIS — E119 Type 2 diabetes mellitus without complications: Secondary | ICD-10-CM | POA: Diagnosis not present

## 2018-09-22 DIAGNOSIS — I509 Heart failure, unspecified: Secondary | ICD-10-CM | POA: Diagnosis not present

## 2018-09-23 DIAGNOSIS — I5033 Acute on chronic diastolic (congestive) heart failure: Secondary | ICD-10-CM | POA: Diagnosis not present

## 2018-09-23 DIAGNOSIS — J9811 Atelectasis: Secondary | ICD-10-CM | POA: Diagnosis not present

## 2018-09-23 DIAGNOSIS — J90 Pleural effusion, not elsewhere classified: Secondary | ICD-10-CM | POA: Diagnosis not present

## 2018-09-23 DIAGNOSIS — N179 Acute kidney failure, unspecified: Secondary | ICD-10-CM | POA: Diagnosis not present

## 2018-09-23 DIAGNOSIS — J811 Chronic pulmonary edema: Secondary | ICD-10-CM | POA: Diagnosis not present

## 2018-09-23 DIAGNOSIS — N189 Chronic kidney disease, unspecified: Secondary | ICD-10-CM | POA: Diagnosis not present

## 2018-09-24 DIAGNOSIS — M79671 Pain in right foot: Secondary | ICD-10-CM | POA: Diagnosis not present

## 2018-09-24 DIAGNOSIS — M25551 Pain in right hip: Secondary | ICD-10-CM | POA: Diagnosis not present

## 2018-09-24 DIAGNOSIS — R195 Other fecal abnormalities: Secondary | ICD-10-CM | POA: Diagnosis not present

## 2018-09-24 DIAGNOSIS — Z9049 Acquired absence of other specified parts of digestive tract: Secondary | ICD-10-CM | POA: Diagnosis not present

## 2018-09-24 DIAGNOSIS — N189 Chronic kidney disease, unspecified: Secondary | ICD-10-CM | POA: Diagnosis not present

## 2018-09-24 DIAGNOSIS — Z794 Long term (current) use of insulin: Secondary | ICD-10-CM | POA: Diagnosis not present

## 2018-09-24 DIAGNOSIS — M79672 Pain in left foot: Secondary | ICD-10-CM | POA: Diagnosis not present

## 2018-09-24 DIAGNOSIS — K5900 Constipation, unspecified: Secondary | ICD-10-CM | POA: Diagnosis not present

## 2018-09-24 DIAGNOSIS — R531 Weakness: Secondary | ICD-10-CM | POA: Diagnosis not present

## 2018-09-24 DIAGNOSIS — Z881 Allergy status to other antibiotic agents status: Secondary | ICD-10-CM | POA: Diagnosis not present

## 2018-09-24 DIAGNOSIS — I5030 Unspecified diastolic (congestive) heart failure: Secondary | ICD-10-CM | POA: Diagnosis not present

## 2018-09-24 DIAGNOSIS — R1084 Generalized abdominal pain: Secondary | ICD-10-CM | POA: Diagnosis not present

## 2018-09-24 DIAGNOSIS — R5383 Other fatigue: Secondary | ICD-10-CM | POA: Diagnosis not present

## 2018-09-24 DIAGNOSIS — B961 Klebsiella pneumoniae [K. pneumoniae] as the cause of diseases classified elsewhere: Secondary | ICD-10-CM | POA: Diagnosis not present

## 2018-09-24 DIAGNOSIS — E1142 Type 2 diabetes mellitus with diabetic polyneuropathy: Secondary | ICD-10-CM | POA: Diagnosis not present

## 2018-09-24 DIAGNOSIS — E11621 Type 2 diabetes mellitus with foot ulcer: Secondary | ICD-10-CM | POA: Diagnosis not present

## 2018-09-24 DIAGNOSIS — Z87891 Personal history of nicotine dependence: Secondary | ICD-10-CM | POA: Diagnosis not present

## 2018-09-24 DIAGNOSIS — I509 Heart failure, unspecified: Secondary | ICD-10-CM | POA: Diagnosis not present

## 2018-09-24 DIAGNOSIS — M25561 Pain in right knee: Secondary | ICD-10-CM | POA: Diagnosis not present

## 2018-09-24 DIAGNOSIS — E1140 Type 2 diabetes mellitus with diabetic neuropathy, unspecified: Secondary | ICD-10-CM | POA: Diagnosis not present

## 2018-09-24 DIAGNOSIS — Z7401 Bed confinement status: Secondary | ICD-10-CM | POA: Diagnosis not present

## 2018-09-24 DIAGNOSIS — L97509 Non-pressure chronic ulcer of other part of unspecified foot with unspecified severity: Secondary | ICD-10-CM | POA: Diagnosis not present

## 2018-09-24 DIAGNOSIS — L89610 Pressure ulcer of right heel, unstageable: Secondary | ICD-10-CM | POA: Diagnosis not present

## 2018-09-24 DIAGNOSIS — Z743 Need for continuous supervision: Secondary | ICD-10-CM | POA: Diagnosis not present

## 2018-09-24 DIAGNOSIS — N184 Chronic kidney disease, stage 4 (severe): Secondary | ICD-10-CM | POA: Diagnosis not present

## 2018-09-24 DIAGNOSIS — R197 Diarrhea, unspecified: Secondary | ICD-10-CM | POA: Diagnosis not present

## 2018-09-24 DIAGNOSIS — I10 Essential (primary) hypertension: Secondary | ICD-10-CM | POA: Diagnosis not present

## 2018-09-24 DIAGNOSIS — R0902 Hypoxemia: Secondary | ICD-10-CM | POA: Diagnosis not present

## 2018-09-24 DIAGNOSIS — R110 Nausea: Secondary | ICD-10-CM | POA: Diagnosis not present

## 2018-09-24 DIAGNOSIS — R42 Dizziness and giddiness: Secondary | ICD-10-CM | POA: Diagnosis not present

## 2018-09-24 DIAGNOSIS — R2689 Other abnormalities of gait and mobility: Secondary | ICD-10-CM | POA: Diagnosis not present

## 2018-09-24 DIAGNOSIS — N319 Neuromuscular dysfunction of bladder, unspecified: Secondary | ICD-10-CM | POA: Diagnosis not present

## 2018-09-24 DIAGNOSIS — K922 Gastrointestinal hemorrhage, unspecified: Secondary | ICD-10-CM | POA: Diagnosis not present

## 2018-09-24 DIAGNOSIS — M358 Other specified systemic involvement of connective tissue: Secondary | ICD-10-CM | POA: Diagnosis not present

## 2018-09-24 DIAGNOSIS — Z8673 Personal history of transient ischemic attack (TIA), and cerebral infarction without residual deficits: Secondary | ICD-10-CM | POA: Diagnosis not present

## 2018-09-24 DIAGNOSIS — L89312 Pressure ulcer of right buttock, stage 2: Secondary | ICD-10-CM | POA: Diagnosis not present

## 2018-09-24 DIAGNOSIS — I110 Hypertensive heart disease with heart failure: Secondary | ICD-10-CM | POA: Diagnosis not present

## 2018-09-24 DIAGNOSIS — R29898 Other symptoms and signs involving the musculoskeletal system: Secondary | ICD-10-CM | POA: Diagnosis not present

## 2018-09-24 DIAGNOSIS — I130 Hypertensive heart and chronic kidney disease with heart failure and stage 1 through stage 4 chronic kidney disease, or unspecified chronic kidney disease: Secondary | ICD-10-CM | POA: Diagnosis not present

## 2018-09-24 DIAGNOSIS — I5033 Acute on chronic diastolic (congestive) heart failure: Secondary | ICD-10-CM | POA: Diagnosis not present

## 2018-09-24 DIAGNOSIS — I700 Atherosclerosis of aorta: Secondary | ICD-10-CM | POA: Diagnosis not present

## 2018-09-24 DIAGNOSIS — M722 Plantar fascial fibromatosis: Secondary | ICD-10-CM | POA: Diagnosis not present

## 2018-09-24 DIAGNOSIS — L97419 Non-pressure chronic ulcer of right heel and midfoot with unspecified severity: Secondary | ICD-10-CM | POA: Diagnosis not present

## 2018-09-24 DIAGNOSIS — R338 Other retention of urine: Secondary | ICD-10-CM | POA: Diagnosis not present

## 2018-09-24 DIAGNOSIS — E119 Type 2 diabetes mellitus without complications: Secondary | ICD-10-CM | POA: Diagnosis not present

## 2018-09-24 DIAGNOSIS — J189 Pneumonia, unspecified organism: Secondary | ICD-10-CM | POA: Diagnosis not present

## 2018-09-24 DIAGNOSIS — G894 Chronic pain syndrome: Secondary | ICD-10-CM | POA: Diagnosis not present

## 2018-09-24 DIAGNOSIS — Z7982 Long term (current) use of aspirin: Secondary | ICD-10-CM | POA: Diagnosis not present

## 2018-09-24 DIAGNOSIS — M6281 Muscle weakness (generalized): Secondary | ICD-10-CM | POA: Diagnosis not present

## 2018-09-24 DIAGNOSIS — Z87440 Personal history of urinary (tract) infections: Secondary | ICD-10-CM | POA: Diagnosis not present

## 2018-09-24 DIAGNOSIS — L89614 Pressure ulcer of right heel, stage 4: Secondary | ICD-10-CM | POA: Diagnosis not present

## 2018-09-24 DIAGNOSIS — E1165 Type 2 diabetes mellitus with hyperglycemia: Secondary | ICD-10-CM | POA: Diagnosis not present

## 2018-09-24 DIAGNOSIS — T83511D Infection and inflammatory reaction due to indwelling urethral catheter, subsequent encounter: Secondary | ICD-10-CM | POA: Diagnosis not present

## 2018-09-24 DIAGNOSIS — D649 Anemia, unspecified: Secondary | ICD-10-CM | POA: Diagnosis not present

## 2018-09-24 DIAGNOSIS — N390 Urinary tract infection, site not specified: Secondary | ICD-10-CM | POA: Diagnosis not present

## 2018-09-24 DIAGNOSIS — I5031 Acute diastolic (congestive) heart failure: Secondary | ICD-10-CM | POA: Diagnosis not present

## 2018-09-24 DIAGNOSIS — L97418 Non-pressure chronic ulcer of right heel and midfoot with other specified severity: Secondary | ICD-10-CM | POA: Diagnosis not present

## 2018-09-24 DIAGNOSIS — R5381 Other malaise: Secondary | ICD-10-CM | POA: Diagnosis not present

## 2018-09-24 DIAGNOSIS — I959 Hypotension, unspecified: Secondary | ICD-10-CM | POA: Diagnosis not present

## 2018-09-24 DIAGNOSIS — K641 Second degree hemorrhoids: Secondary | ICD-10-CM | POA: Diagnosis not present

## 2018-09-24 DIAGNOSIS — R1032 Left lower quadrant pain: Secondary | ICD-10-CM | POA: Diagnosis not present

## 2018-09-24 DIAGNOSIS — I739 Peripheral vascular disease, unspecified: Secondary | ICD-10-CM | POA: Diagnosis not present

## 2018-09-24 DIAGNOSIS — R279 Unspecified lack of coordination: Secondary | ICD-10-CM | POA: Diagnosis not present

## 2018-09-24 DIAGNOSIS — I96 Gangrene, not elsewhere classified: Secondary | ICD-10-CM | POA: Diagnosis not present

## 2018-09-24 DIAGNOSIS — K5730 Diverticulosis of large intestine without perforation or abscess without bleeding: Secondary | ICD-10-CM | POA: Diagnosis not present

## 2018-09-24 DIAGNOSIS — N179 Acute kidney failure, unspecified: Secondary | ICD-10-CM | POA: Diagnosis not present

## 2018-09-24 DIAGNOSIS — Z79899 Other long term (current) drug therapy: Secondary | ICD-10-CM | POA: Diagnosis not present

## 2018-09-24 DIAGNOSIS — K921 Melena: Secondary | ICD-10-CM | POA: Diagnosis not present

## 2018-09-24 DIAGNOSIS — E1122 Type 2 diabetes mellitus with diabetic chronic kidney disease: Secondary | ICD-10-CM | POA: Diagnosis not present

## 2018-09-24 DIAGNOSIS — N139 Obstructive and reflux uropathy, unspecified: Secondary | ICD-10-CM | POA: Diagnosis not present

## 2018-09-24 DIAGNOSIS — R450 Nervousness: Secondary | ICD-10-CM | POA: Diagnosis not present

## 2018-09-24 DIAGNOSIS — K2950 Unspecified chronic gastritis without bleeding: Secondary | ICD-10-CM | POA: Diagnosis not present

## 2018-09-26 DIAGNOSIS — N390 Urinary tract infection, site not specified: Secondary | ICD-10-CM | POA: Diagnosis not present

## 2018-09-26 DIAGNOSIS — N179 Acute kidney failure, unspecified: Secondary | ICD-10-CM | POA: Diagnosis not present

## 2018-09-26 DIAGNOSIS — E1142 Type 2 diabetes mellitus with diabetic polyneuropathy: Secondary | ICD-10-CM | POA: Diagnosis not present

## 2018-10-03 DIAGNOSIS — I10 Essential (primary) hypertension: Secondary | ICD-10-CM | POA: Diagnosis not present

## 2018-10-03 DIAGNOSIS — E1165 Type 2 diabetes mellitus with hyperglycemia: Secondary | ICD-10-CM | POA: Diagnosis not present

## 2018-10-04 DIAGNOSIS — I509 Heart failure, unspecified: Secondary | ICD-10-CM | POA: Diagnosis not present

## 2018-10-04 DIAGNOSIS — R0902 Hypoxemia: Secondary | ICD-10-CM | POA: Diagnosis not present

## 2018-10-04 DIAGNOSIS — N184 Chronic kidney disease, stage 4 (severe): Secondary | ICD-10-CM | POA: Diagnosis not present

## 2018-10-04 DIAGNOSIS — M79671 Pain in right foot: Secondary | ICD-10-CM | POA: Diagnosis not present

## 2018-10-04 DIAGNOSIS — Z79899 Other long term (current) drug therapy: Secondary | ICD-10-CM | POA: Diagnosis not present

## 2018-10-04 DIAGNOSIS — I96 Gangrene, not elsewhere classified: Secondary | ICD-10-CM | POA: Diagnosis not present

## 2018-10-04 DIAGNOSIS — R195 Other fecal abnormalities: Secondary | ICD-10-CM | POA: Diagnosis not present

## 2018-10-04 DIAGNOSIS — R1084 Generalized abdominal pain: Secondary | ICD-10-CM | POA: Diagnosis not present

## 2018-10-04 DIAGNOSIS — R5381 Other malaise: Secondary | ICD-10-CM | POA: Diagnosis not present

## 2018-10-04 DIAGNOSIS — E1165 Type 2 diabetes mellitus with hyperglycemia: Secondary | ICD-10-CM | POA: Diagnosis not present

## 2018-10-04 DIAGNOSIS — R5383 Other fatigue: Secondary | ICD-10-CM | POA: Diagnosis not present

## 2018-10-04 DIAGNOSIS — I5033 Acute on chronic diastolic (congestive) heart failure: Secondary | ICD-10-CM | POA: Diagnosis not present

## 2018-10-04 DIAGNOSIS — L89610 Pressure ulcer of right heel, unstageable: Secondary | ICD-10-CM | POA: Diagnosis not present

## 2018-10-04 DIAGNOSIS — Z9049 Acquired absence of other specified parts of digestive tract: Secondary | ICD-10-CM | POA: Diagnosis not present

## 2018-10-04 DIAGNOSIS — K922 Gastrointestinal hemorrhage, unspecified: Secondary | ICD-10-CM | POA: Diagnosis not present

## 2018-10-04 DIAGNOSIS — G894 Chronic pain syndrome: Secondary | ICD-10-CM | POA: Diagnosis not present

## 2018-10-04 DIAGNOSIS — T83511D Infection and inflammatory reaction due to indwelling urethral catheter, subsequent encounter: Secondary | ICD-10-CM | POA: Diagnosis not present

## 2018-10-04 DIAGNOSIS — E1142 Type 2 diabetes mellitus with diabetic polyneuropathy: Secondary | ICD-10-CM | POA: Diagnosis not present

## 2018-10-04 DIAGNOSIS — I10 Essential (primary) hypertension: Secondary | ICD-10-CM | POA: Diagnosis not present

## 2018-10-04 DIAGNOSIS — Z794 Long term (current) use of insulin: Secondary | ICD-10-CM | POA: Diagnosis not present

## 2018-10-04 DIAGNOSIS — R2689 Other abnormalities of gait and mobility: Secondary | ICD-10-CM | POA: Diagnosis not present

## 2018-10-04 DIAGNOSIS — Z8673 Personal history of transient ischemic attack (TIA), and cerebral infarction without residual deficits: Secondary | ICD-10-CM | POA: Diagnosis not present

## 2018-10-04 DIAGNOSIS — B961 Klebsiella pneumoniae [K. pneumoniae] as the cause of diseases classified elsewhere: Secondary | ICD-10-CM | POA: Diagnosis not present

## 2018-10-04 DIAGNOSIS — E119 Type 2 diabetes mellitus without complications: Secondary | ICD-10-CM | POA: Diagnosis not present

## 2018-10-04 DIAGNOSIS — R110 Nausea: Secondary | ICD-10-CM | POA: Diagnosis not present

## 2018-10-04 DIAGNOSIS — M358 Other specified systemic involvement of connective tissue: Secondary | ICD-10-CM | POA: Diagnosis not present

## 2018-10-04 DIAGNOSIS — L97509 Non-pressure chronic ulcer of other part of unspecified foot with unspecified severity: Secondary | ICD-10-CM | POA: Diagnosis not present

## 2018-10-04 DIAGNOSIS — N319 Neuromuscular dysfunction of bladder, unspecified: Secondary | ICD-10-CM | POA: Diagnosis not present

## 2018-10-04 DIAGNOSIS — D649 Anemia, unspecified: Secondary | ICD-10-CM | POA: Diagnosis not present

## 2018-10-04 DIAGNOSIS — I700 Atherosclerosis of aorta: Secondary | ICD-10-CM | POA: Diagnosis not present

## 2018-10-04 DIAGNOSIS — Z87891 Personal history of nicotine dependence: Secondary | ICD-10-CM | POA: Diagnosis not present

## 2018-10-04 DIAGNOSIS — I110 Hypertensive heart disease with heart failure: Secondary | ICD-10-CM | POA: Diagnosis not present

## 2018-10-04 DIAGNOSIS — N390 Urinary tract infection, site not specified: Secondary | ICD-10-CM | POA: Diagnosis not present

## 2018-10-04 DIAGNOSIS — R1032 Left lower quadrant pain: Secondary | ICD-10-CM | POA: Diagnosis not present

## 2018-10-04 DIAGNOSIS — E1140 Type 2 diabetes mellitus with diabetic neuropathy, unspecified: Secondary | ICD-10-CM | POA: Diagnosis not present

## 2018-10-04 DIAGNOSIS — L89614 Pressure ulcer of right heel, stage 4: Secondary | ICD-10-CM | POA: Diagnosis not present

## 2018-10-04 DIAGNOSIS — M6281 Muscle weakness (generalized): Secondary | ICD-10-CM | POA: Diagnosis not present

## 2018-10-04 DIAGNOSIS — Z7982 Long term (current) use of aspirin: Secondary | ICD-10-CM | POA: Diagnosis not present

## 2018-10-04 DIAGNOSIS — M79672 Pain in left foot: Secondary | ICD-10-CM | POA: Diagnosis not present

## 2018-10-04 DIAGNOSIS — K5900 Constipation, unspecified: Secondary | ICD-10-CM | POA: Diagnosis not present

## 2018-10-04 DIAGNOSIS — R42 Dizziness and giddiness: Secondary | ICD-10-CM | POA: Diagnosis not present

## 2018-10-04 DIAGNOSIS — Z881 Allergy status to other antibiotic agents status: Secondary | ICD-10-CM | POA: Diagnosis not present

## 2018-10-04 DIAGNOSIS — N179 Acute kidney failure, unspecified: Secondary | ICD-10-CM | POA: Diagnosis not present

## 2018-10-04 DIAGNOSIS — M25561 Pain in right knee: Secondary | ICD-10-CM | POA: Diagnosis not present

## 2018-10-04 DIAGNOSIS — R279 Unspecified lack of coordination: Secondary | ICD-10-CM | POA: Diagnosis not present

## 2018-10-04 DIAGNOSIS — K5730 Diverticulosis of large intestine without perforation or abscess without bleeding: Secondary | ICD-10-CM | POA: Diagnosis not present

## 2018-10-04 DIAGNOSIS — M722 Plantar fascial fibromatosis: Secondary | ICD-10-CM | POA: Diagnosis not present

## 2018-10-04 DIAGNOSIS — Z7401 Bed confinement status: Secondary | ICD-10-CM | POA: Diagnosis not present

## 2018-10-04 DIAGNOSIS — I739 Peripheral vascular disease, unspecified: Secondary | ICD-10-CM | POA: Diagnosis not present

## 2018-10-04 DIAGNOSIS — R531 Weakness: Secondary | ICD-10-CM | POA: Diagnosis not present

## 2018-10-04 DIAGNOSIS — I959 Hypotension, unspecified: Secondary | ICD-10-CM | POA: Diagnosis not present

## 2018-10-08 DIAGNOSIS — M79672 Pain in left foot: Secondary | ICD-10-CM | POA: Diagnosis not present

## 2018-10-08 DIAGNOSIS — M79671 Pain in right foot: Secondary | ICD-10-CM | POA: Diagnosis not present

## 2018-10-11 DIAGNOSIS — I700 Atherosclerosis of aorta: Secondary | ICD-10-CM | POA: Diagnosis not present

## 2018-10-11 DIAGNOSIS — Z8673 Personal history of transient ischemic attack (TIA), and cerebral infarction without residual deficits: Secondary | ICD-10-CM | POA: Diagnosis not present

## 2018-10-11 DIAGNOSIS — D649 Anemia, unspecified: Secondary | ICD-10-CM | POA: Diagnosis not present

## 2018-10-11 DIAGNOSIS — K5730 Diverticulosis of large intestine without perforation or abscess without bleeding: Secondary | ICD-10-CM | POA: Diagnosis not present

## 2018-10-11 DIAGNOSIS — M722 Plantar fascial fibromatosis: Secondary | ICD-10-CM | POA: Diagnosis not present

## 2018-10-11 DIAGNOSIS — R1084 Generalized abdominal pain: Secondary | ICD-10-CM | POA: Diagnosis not present

## 2018-10-11 DIAGNOSIS — R1032 Left lower quadrant pain: Secondary | ICD-10-CM | POA: Diagnosis not present

## 2018-10-11 DIAGNOSIS — K5900 Constipation, unspecified: Secondary | ICD-10-CM | POA: Diagnosis not present

## 2018-10-11 DIAGNOSIS — L97509 Non-pressure chronic ulcer of other part of unspecified foot with unspecified severity: Secondary | ICD-10-CM | POA: Diagnosis not present

## 2018-10-11 DIAGNOSIS — Z79899 Other long term (current) drug therapy: Secondary | ICD-10-CM | POA: Diagnosis not present

## 2018-10-11 DIAGNOSIS — R42 Dizziness and giddiness: Secondary | ICD-10-CM | POA: Diagnosis not present

## 2018-10-11 DIAGNOSIS — Z794 Long term (current) use of insulin: Secondary | ICD-10-CM | POA: Diagnosis not present

## 2018-10-11 DIAGNOSIS — K922 Gastrointestinal hemorrhage, unspecified: Secondary | ICD-10-CM | POA: Diagnosis not present

## 2018-10-11 DIAGNOSIS — Z87891 Personal history of nicotine dependence: Secondary | ICD-10-CM | POA: Diagnosis not present

## 2018-10-11 DIAGNOSIS — Z7982 Long term (current) use of aspirin: Secondary | ICD-10-CM | POA: Diagnosis not present

## 2018-10-11 DIAGNOSIS — Z9049 Acquired absence of other specified parts of digestive tract: Secondary | ICD-10-CM | POA: Diagnosis not present

## 2018-10-11 DIAGNOSIS — I10 Essential (primary) hypertension: Secondary | ICD-10-CM | POA: Diagnosis not present

## 2018-10-11 DIAGNOSIS — E119 Type 2 diabetes mellitus without complications: Secondary | ICD-10-CM | POA: Diagnosis not present

## 2018-10-12 DIAGNOSIS — N179 Acute kidney failure, unspecified: Secondary | ICD-10-CM | POA: Diagnosis not present

## 2018-10-12 DIAGNOSIS — E1142 Type 2 diabetes mellitus with diabetic polyneuropathy: Secondary | ICD-10-CM | POA: Diagnosis not present

## 2018-10-13 DIAGNOSIS — R195 Other fecal abnormalities: Secondary | ICD-10-CM | POA: Diagnosis not present

## 2018-10-13 DIAGNOSIS — Z7982 Long term (current) use of aspirin: Secondary | ICD-10-CM | POA: Diagnosis not present

## 2018-10-13 DIAGNOSIS — Z881 Allergy status to other antibiotic agents status: Secondary | ICD-10-CM | POA: Diagnosis not present

## 2018-10-13 DIAGNOSIS — E1165 Type 2 diabetes mellitus with hyperglycemia: Secondary | ICD-10-CM | POA: Diagnosis not present

## 2018-10-13 DIAGNOSIS — I10 Essential (primary) hypertension: Secondary | ICD-10-CM | POA: Diagnosis not present

## 2018-10-13 DIAGNOSIS — I739 Peripheral vascular disease, unspecified: Secondary | ICD-10-CM | POA: Diagnosis not present

## 2018-10-13 DIAGNOSIS — D649 Anemia, unspecified: Secondary | ICD-10-CM | POA: Diagnosis not present

## 2018-10-13 DIAGNOSIS — Z794 Long term (current) use of insulin: Secondary | ICD-10-CM | POA: Diagnosis not present

## 2018-10-13 DIAGNOSIS — Z79899 Other long term (current) drug therapy: Secondary | ICD-10-CM | POA: Diagnosis not present

## 2018-10-13 DIAGNOSIS — L89610 Pressure ulcer of right heel, unstageable: Secondary | ICD-10-CM | POA: Diagnosis not present

## 2018-10-13 DIAGNOSIS — Z8673 Personal history of transient ischemic attack (TIA), and cerebral infarction without residual deficits: Secondary | ICD-10-CM | POA: Diagnosis not present

## 2018-10-13 DIAGNOSIS — M25561 Pain in right knee: Secondary | ICD-10-CM | POA: Diagnosis not present

## 2018-10-17 DIAGNOSIS — Z881 Allergy status to other antibiotic agents status: Secondary | ICD-10-CM | POA: Diagnosis not present

## 2018-10-17 DIAGNOSIS — Z8673 Personal history of transient ischemic attack (TIA), and cerebral infarction without residual deficits: Secondary | ICD-10-CM | POA: Diagnosis not present

## 2018-10-17 DIAGNOSIS — R195 Other fecal abnormalities: Secondary | ICD-10-CM | POA: Diagnosis not present

## 2018-10-17 DIAGNOSIS — I10 Essential (primary) hypertension: Secondary | ICD-10-CM | POA: Diagnosis not present

## 2018-10-17 DIAGNOSIS — E1165 Type 2 diabetes mellitus with hyperglycemia: Secondary | ICD-10-CM | POA: Diagnosis not present

## 2018-10-17 DIAGNOSIS — D649 Anemia, unspecified: Secondary | ICD-10-CM | POA: Diagnosis not present

## 2018-10-17 DIAGNOSIS — L89614 Pressure ulcer of right heel, stage 4: Secondary | ICD-10-CM | POA: Diagnosis not present

## 2018-10-17 DIAGNOSIS — Z794 Long term (current) use of insulin: Secondary | ICD-10-CM | POA: Diagnosis not present

## 2018-10-17 DIAGNOSIS — Z7982 Long term (current) use of aspirin: Secondary | ICD-10-CM | POA: Diagnosis not present

## 2018-10-17 DIAGNOSIS — Z79899 Other long term (current) drug therapy: Secondary | ICD-10-CM | POA: Diagnosis not present

## 2018-10-17 DIAGNOSIS — R531 Weakness: Secondary | ICD-10-CM | POA: Diagnosis not present

## 2018-10-17 DIAGNOSIS — R5383 Other fatigue: Secondary | ICD-10-CM | POA: Diagnosis not present

## 2018-10-17 DIAGNOSIS — M358 Other specified systemic involvement of connective tissue: Secondary | ICD-10-CM | POA: Diagnosis not present

## 2018-10-17 DIAGNOSIS — I5033 Acute on chronic diastolic (congestive) heart failure: Secondary | ICD-10-CM | POA: Diagnosis not present

## 2018-10-19 DIAGNOSIS — Z881 Allergy status to other antibiotic agents status: Secondary | ICD-10-CM | POA: Diagnosis not present

## 2018-10-19 DIAGNOSIS — Z79899 Other long term (current) drug therapy: Secondary | ICD-10-CM | POA: Diagnosis not present

## 2018-10-19 DIAGNOSIS — E119 Type 2 diabetes mellitus without complications: Secondary | ICD-10-CM | POA: Diagnosis not present

## 2018-10-19 DIAGNOSIS — R195 Other fecal abnormalities: Secondary | ICD-10-CM | POA: Diagnosis not present

## 2018-10-19 DIAGNOSIS — R531 Weakness: Secondary | ICD-10-CM | POA: Diagnosis not present

## 2018-10-19 DIAGNOSIS — L89610 Pressure ulcer of right heel, unstageable: Secondary | ICD-10-CM | POA: Diagnosis not present

## 2018-10-19 DIAGNOSIS — L89614 Pressure ulcer of right heel, stage 4: Secondary | ICD-10-CM | POA: Diagnosis not present

## 2018-10-19 DIAGNOSIS — E1165 Type 2 diabetes mellitus with hyperglycemia: Secondary | ICD-10-CM | POA: Diagnosis not present

## 2018-10-19 DIAGNOSIS — I509 Heart failure, unspecified: Secondary | ICD-10-CM | POA: Diagnosis not present

## 2018-10-19 DIAGNOSIS — Z7982 Long term (current) use of aspirin: Secondary | ICD-10-CM | POA: Diagnosis not present

## 2018-10-19 DIAGNOSIS — D649 Anemia, unspecified: Secondary | ICD-10-CM | POA: Diagnosis not present

## 2018-10-19 DIAGNOSIS — R5383 Other fatigue: Secondary | ICD-10-CM | POA: Diagnosis not present

## 2018-10-19 DIAGNOSIS — Z8673 Personal history of transient ischemic attack (TIA), and cerebral infarction without residual deficits: Secondary | ICD-10-CM | POA: Diagnosis not present

## 2018-10-19 DIAGNOSIS — I10 Essential (primary) hypertension: Secondary | ICD-10-CM | POA: Diagnosis not present

## 2018-10-19 DIAGNOSIS — Z794 Long term (current) use of insulin: Secondary | ICD-10-CM | POA: Diagnosis not present

## 2018-10-22 DIAGNOSIS — N179 Acute kidney failure, unspecified: Secondary | ICD-10-CM | POA: Diagnosis not present

## 2018-10-22 DIAGNOSIS — E1142 Type 2 diabetes mellitus with diabetic polyneuropathy: Secondary | ICD-10-CM | POA: Diagnosis not present

## 2018-10-23 DIAGNOSIS — D649 Anemia, unspecified: Secondary | ICD-10-CM | POA: Diagnosis not present

## 2018-10-23 DIAGNOSIS — R195 Other fecal abnormalities: Secondary | ICD-10-CM | POA: Diagnosis not present

## 2018-10-23 DIAGNOSIS — R531 Weakness: Secondary | ICD-10-CM | POA: Diagnosis not present

## 2018-10-26 DIAGNOSIS — D649 Anemia, unspecified: Secondary | ICD-10-CM | POA: Diagnosis not present

## 2018-10-27 DIAGNOSIS — D649 Anemia, unspecified: Secondary | ICD-10-CM | POA: Diagnosis not present

## 2018-11-06 DIAGNOSIS — E1142 Type 2 diabetes mellitus with diabetic polyneuropathy: Secondary | ICD-10-CM | POA: Diagnosis not present

## 2018-11-06 DIAGNOSIS — L89312 Pressure ulcer of right buttock, stage 2: Secondary | ICD-10-CM | POA: Diagnosis not present

## 2018-11-06 DIAGNOSIS — R338 Other retention of urine: Secondary | ICD-10-CM | POA: Diagnosis not present

## 2018-11-06 DIAGNOSIS — N319 Neuromuscular dysfunction of bladder, unspecified: Secondary | ICD-10-CM | POA: Diagnosis not present

## 2018-11-06 DIAGNOSIS — N139 Obstructive and reflux uropathy, unspecified: Secondary | ICD-10-CM | POA: Diagnosis not present

## 2018-11-09 DIAGNOSIS — I509 Heart failure, unspecified: Secondary | ICD-10-CM | POA: Diagnosis not present

## 2018-11-09 DIAGNOSIS — Z8673 Personal history of transient ischemic attack (TIA), and cerebral infarction without residual deficits: Secondary | ICD-10-CM | POA: Diagnosis not present

## 2018-11-09 DIAGNOSIS — R195 Other fecal abnormalities: Secondary | ICD-10-CM | POA: Diagnosis not present

## 2018-11-09 DIAGNOSIS — Z794 Long term (current) use of insulin: Secondary | ICD-10-CM | POA: Diagnosis not present

## 2018-11-09 DIAGNOSIS — K921 Melena: Secondary | ICD-10-CM | POA: Diagnosis not present

## 2018-11-09 DIAGNOSIS — K5730 Diverticulosis of large intestine without perforation or abscess without bleeding: Secondary | ICD-10-CM | POA: Diagnosis not present

## 2018-11-09 DIAGNOSIS — R450 Nervousness: Secondary | ICD-10-CM | POA: Diagnosis not present

## 2018-11-09 DIAGNOSIS — Z87440 Personal history of urinary (tract) infections: Secondary | ICD-10-CM | POA: Diagnosis not present

## 2018-11-09 DIAGNOSIS — I130 Hypertensive heart and chronic kidney disease with heart failure and stage 1 through stage 4 chronic kidney disease, or unspecified chronic kidney disease: Secondary | ICD-10-CM | POA: Diagnosis not present

## 2018-11-09 DIAGNOSIS — I10 Essential (primary) hypertension: Secondary | ICD-10-CM | POA: Diagnosis not present

## 2018-11-09 DIAGNOSIS — R531 Weakness: Secondary | ICD-10-CM | POA: Diagnosis not present

## 2018-11-09 DIAGNOSIS — N184 Chronic kidney disease, stage 4 (severe): Secondary | ICD-10-CM | POA: Diagnosis not present

## 2018-11-09 DIAGNOSIS — K2950 Unspecified chronic gastritis without bleeding: Secondary | ICD-10-CM | POA: Diagnosis not present

## 2018-11-09 DIAGNOSIS — R29898 Other symptoms and signs involving the musculoskeletal system: Secondary | ICD-10-CM | POA: Diagnosis not present

## 2018-11-09 DIAGNOSIS — R197 Diarrhea, unspecified: Secondary | ICD-10-CM | POA: Diagnosis not present

## 2018-11-09 DIAGNOSIS — K641 Second degree hemorrhoids: Secondary | ICD-10-CM | POA: Diagnosis not present

## 2018-11-09 DIAGNOSIS — Z79899 Other long term (current) drug therapy: Secondary | ICD-10-CM | POA: Diagnosis not present

## 2018-11-09 DIAGNOSIS — Z9049 Acquired absence of other specified parts of digestive tract: Secondary | ICD-10-CM | POA: Diagnosis not present

## 2018-11-09 DIAGNOSIS — Z87891 Personal history of nicotine dependence: Secondary | ICD-10-CM | POA: Diagnosis not present

## 2018-11-09 DIAGNOSIS — D649 Anemia, unspecified: Secondary | ICD-10-CM | POA: Diagnosis not present

## 2018-11-09 DIAGNOSIS — E1122 Type 2 diabetes mellitus with diabetic chronic kidney disease: Secondary | ICD-10-CM | POA: Diagnosis not present

## 2018-11-09 DIAGNOSIS — Z7401 Bed confinement status: Secondary | ICD-10-CM | POA: Diagnosis not present

## 2018-11-09 DIAGNOSIS — Z743 Need for continuous supervision: Secondary | ICD-10-CM | POA: Diagnosis not present

## 2018-11-14 DIAGNOSIS — I10 Essential (primary) hypertension: Secondary | ICD-10-CM | POA: Diagnosis not present

## 2018-11-14 DIAGNOSIS — I5031 Acute diastolic (congestive) heart failure: Secondary | ICD-10-CM | POA: Diagnosis not present

## 2018-11-17 DIAGNOSIS — E11621 Type 2 diabetes mellitus with foot ulcer: Secondary | ICD-10-CM | POA: Diagnosis not present

## 2018-11-17 DIAGNOSIS — L97418 Non-pressure chronic ulcer of right heel and midfoot with other specified severity: Secondary | ICD-10-CM | POA: Diagnosis not present

## 2018-11-17 DIAGNOSIS — L89614 Pressure ulcer of right heel, stage 4: Secondary | ICD-10-CM | POA: Diagnosis not present

## 2018-11-17 DIAGNOSIS — N179 Acute kidney failure, unspecified: Secondary | ICD-10-CM | POA: Diagnosis not present

## 2018-11-17 DIAGNOSIS — L97419 Non-pressure chronic ulcer of right heel and midfoot with unspecified severity: Secondary | ICD-10-CM | POA: Diagnosis not present

## 2018-11-17 DIAGNOSIS — E1142 Type 2 diabetes mellitus with diabetic polyneuropathy: Secondary | ICD-10-CM | POA: Diagnosis not present

## 2018-12-15 DIAGNOSIS — L89619 Pressure ulcer of right heel, unspecified stage: Secondary | ICD-10-CM | POA: Diagnosis not present

## 2018-12-15 DIAGNOSIS — L89159 Pressure ulcer of sacral region, unspecified stage: Secondary | ICD-10-CM | POA: Diagnosis not present

## 2018-12-15 DIAGNOSIS — Z8673 Personal history of transient ischemic attack (TIA), and cerebral infarction without residual deficits: Secondary | ICD-10-CM | POA: Diagnosis not present

## 2018-12-15 DIAGNOSIS — I10 Essential (primary) hypertension: Secondary | ICD-10-CM | POA: Diagnosis not present

## 2018-12-15 DIAGNOSIS — E119 Type 2 diabetes mellitus without complications: Secondary | ICD-10-CM | POA: Diagnosis not present

## 2018-12-18 DIAGNOSIS — E1142 Type 2 diabetes mellitus with diabetic polyneuropathy: Secondary | ICD-10-CM | POA: Diagnosis not present

## 2018-12-18 DIAGNOSIS — N179 Acute kidney failure, unspecified: Secondary | ICD-10-CM | POA: Diagnosis not present

## 2018-12-19 DIAGNOSIS — Z5181 Encounter for therapeutic drug level monitoring: Secondary | ICD-10-CM | POA: Diagnosis not present

## 2018-12-19 DIAGNOSIS — D649 Anemia, unspecified: Secondary | ICD-10-CM | POA: Diagnosis not present

## 2018-12-19 DIAGNOSIS — E039 Hypothyroidism, unspecified: Secondary | ICD-10-CM | POA: Diagnosis not present

## 2018-12-19 DIAGNOSIS — E538 Deficiency of other specified B group vitamins: Secondary | ICD-10-CM | POA: Diagnosis not present

## 2018-12-19 DIAGNOSIS — E119 Type 2 diabetes mellitus without complications: Secondary | ICD-10-CM | POA: Diagnosis not present

## 2018-12-21 DIAGNOSIS — R410 Disorientation, unspecified: Secondary | ICD-10-CM | POA: Diagnosis not present

## 2018-12-21 DIAGNOSIS — N390 Urinary tract infection, site not specified: Secondary | ICD-10-CM | POA: Diagnosis not present

## 2018-12-21 DIAGNOSIS — R300 Dysuria: Secondary | ICD-10-CM | POA: Diagnosis not present

## 2019-01-04 DIAGNOSIS — M6281 Muscle weakness (generalized): Secondary | ICD-10-CM | POA: Diagnosis not present

## 2019-01-04 DIAGNOSIS — Z23 Encounter for immunization: Secondary | ICD-10-CM | POA: Diagnosis not present

## 2019-01-04 DIAGNOSIS — N179 Acute kidney failure, unspecified: Secondary | ICD-10-CM | POA: Diagnosis not present

## 2019-01-04 DIAGNOSIS — E1142 Type 2 diabetes mellitus with diabetic polyneuropathy: Secondary | ICD-10-CM | POA: Diagnosis not present

## 2019-01-05 DIAGNOSIS — M6281 Muscle weakness (generalized): Secondary | ICD-10-CM | POA: Diagnosis not present

## 2019-01-05 DIAGNOSIS — Z23 Encounter for immunization: Secondary | ICD-10-CM | POA: Diagnosis not present

## 2019-01-08 DIAGNOSIS — Z23 Encounter for immunization: Secondary | ICD-10-CM | POA: Diagnosis not present

## 2019-01-08 DIAGNOSIS — M6281 Muscle weakness (generalized): Secondary | ICD-10-CM | POA: Diagnosis not present

## 2019-01-09 DIAGNOSIS — M6281 Muscle weakness (generalized): Secondary | ICD-10-CM | POA: Diagnosis not present

## 2019-01-09 DIAGNOSIS — Z23 Encounter for immunization: Secondary | ICD-10-CM | POA: Diagnosis not present

## 2019-01-10 DIAGNOSIS — M6281 Muscle weakness (generalized): Secondary | ICD-10-CM | POA: Diagnosis not present

## 2019-01-10 DIAGNOSIS — Z23 Encounter for immunization: Secondary | ICD-10-CM | POA: Diagnosis not present

## 2019-01-11 DIAGNOSIS — Z23 Encounter for immunization: Secondary | ICD-10-CM | POA: Diagnosis not present

## 2019-01-11 DIAGNOSIS — M6281 Muscle weakness (generalized): Secondary | ICD-10-CM | POA: Diagnosis not present

## 2019-01-12 DIAGNOSIS — M6281 Muscle weakness (generalized): Secondary | ICD-10-CM | POA: Diagnosis not present

## 2019-01-12 DIAGNOSIS — Z23 Encounter for immunization: Secondary | ICD-10-CM | POA: Diagnosis not present

## 2019-01-15 DIAGNOSIS — Z23 Encounter for immunization: Secondary | ICD-10-CM | POA: Diagnosis not present

## 2019-01-15 DIAGNOSIS — M6281 Muscle weakness (generalized): Secondary | ICD-10-CM | POA: Diagnosis not present

## 2019-01-16 DIAGNOSIS — Z23 Encounter for immunization: Secondary | ICD-10-CM | POA: Diagnosis not present

## 2019-01-16 DIAGNOSIS — M6281 Muscle weakness (generalized): Secondary | ICD-10-CM | POA: Diagnosis not present

## 2019-01-17 DIAGNOSIS — Z23 Encounter for immunization: Secondary | ICD-10-CM | POA: Diagnosis not present

## 2019-01-17 DIAGNOSIS — M6281 Muscle weakness (generalized): Secondary | ICD-10-CM | POA: Diagnosis not present

## 2019-01-18 DIAGNOSIS — M6281 Muscle weakness (generalized): Secondary | ICD-10-CM | POA: Diagnosis not present

## 2019-01-18 DIAGNOSIS — Z23 Encounter for immunization: Secondary | ICD-10-CM | POA: Diagnosis not present

## 2019-01-19 DIAGNOSIS — Z23 Encounter for immunization: Secondary | ICD-10-CM | POA: Diagnosis not present

## 2019-01-19 DIAGNOSIS — L89619 Pressure ulcer of right heel, unspecified stage: Secondary | ICD-10-CM | POA: Diagnosis not present

## 2019-01-19 DIAGNOSIS — M6281 Muscle weakness (generalized): Secondary | ICD-10-CM | POA: Diagnosis not present

## 2019-01-19 DIAGNOSIS — L97418 Non-pressure chronic ulcer of right heel and midfoot with other specified severity: Secondary | ICD-10-CM | POA: Diagnosis not present

## 2019-01-19 DIAGNOSIS — E11621 Type 2 diabetes mellitus with foot ulcer: Secondary | ICD-10-CM | POA: Diagnosis not present

## 2019-01-19 DIAGNOSIS — L89153 Pressure ulcer of sacral region, stage 3: Secondary | ICD-10-CM | POA: Diagnosis not present

## 2019-01-22 DIAGNOSIS — M6281 Muscle weakness (generalized): Secondary | ICD-10-CM | POA: Diagnosis not present

## 2019-01-22 DIAGNOSIS — Z23 Encounter for immunization: Secondary | ICD-10-CM | POA: Diagnosis not present

## 2019-01-23 DIAGNOSIS — Z23 Encounter for immunization: Secondary | ICD-10-CM | POA: Diagnosis not present

## 2019-01-23 DIAGNOSIS — M6281 Muscle weakness (generalized): Secondary | ICD-10-CM | POA: Diagnosis not present

## 2019-01-24 DIAGNOSIS — M6281 Muscle weakness (generalized): Secondary | ICD-10-CM | POA: Diagnosis not present

## 2019-01-24 DIAGNOSIS — Z23 Encounter for immunization: Secondary | ICD-10-CM | POA: Diagnosis not present

## 2019-01-25 DIAGNOSIS — Z23 Encounter for immunization: Secondary | ICD-10-CM | POA: Diagnosis not present

## 2019-01-25 DIAGNOSIS — M6281 Muscle weakness (generalized): Secondary | ICD-10-CM | POA: Diagnosis not present

## 2019-01-26 DIAGNOSIS — Z23 Encounter for immunization: Secondary | ICD-10-CM | POA: Diagnosis not present

## 2019-01-26 DIAGNOSIS — M6281 Muscle weakness (generalized): Secondary | ICD-10-CM | POA: Diagnosis not present

## 2019-01-29 DIAGNOSIS — M6281 Muscle weakness (generalized): Secondary | ICD-10-CM | POA: Diagnosis not present

## 2019-01-29 DIAGNOSIS — Z23 Encounter for immunization: Secondary | ICD-10-CM | POA: Diagnosis not present

## 2019-01-30 DIAGNOSIS — Z23 Encounter for immunization: Secondary | ICD-10-CM | POA: Diagnosis not present

## 2019-01-30 DIAGNOSIS — M6281 Muscle weakness (generalized): Secondary | ICD-10-CM | POA: Diagnosis not present

## 2019-01-31 DIAGNOSIS — Z23 Encounter for immunization: Secondary | ICD-10-CM | POA: Diagnosis not present

## 2019-01-31 DIAGNOSIS — M6281 Muscle weakness (generalized): Secondary | ICD-10-CM | POA: Diagnosis not present

## 2019-02-01 DIAGNOSIS — Z23 Encounter for immunization: Secondary | ICD-10-CM | POA: Diagnosis not present

## 2019-02-01 DIAGNOSIS — M6281 Muscle weakness (generalized): Secondary | ICD-10-CM | POA: Diagnosis not present

## 2019-02-02 DIAGNOSIS — Z23 Encounter for immunization: Secondary | ICD-10-CM | POA: Diagnosis not present

## 2019-02-02 DIAGNOSIS — M6281 Muscle weakness (generalized): Secondary | ICD-10-CM | POA: Diagnosis not present

## 2019-02-05 DIAGNOSIS — M6281 Muscle weakness (generalized): Secondary | ICD-10-CM | POA: Diagnosis not present

## 2019-02-06 DIAGNOSIS — M6281 Muscle weakness (generalized): Secondary | ICD-10-CM | POA: Diagnosis not present

## 2019-02-06 DIAGNOSIS — R300 Dysuria: Secondary | ICD-10-CM | POA: Diagnosis not present

## 2019-02-08 DIAGNOSIS — M6281 Muscle weakness (generalized): Secondary | ICD-10-CM | POA: Diagnosis not present

## 2019-02-09 DIAGNOSIS — M6281 Muscle weakness (generalized): Secondary | ICD-10-CM | POA: Diagnosis not present

## 2019-02-09 DIAGNOSIS — R300 Dysuria: Secondary | ICD-10-CM | POA: Diagnosis not present

## 2019-02-11 DIAGNOSIS — L8990 Pressure ulcer of unspecified site, unspecified stage: Secondary | ICD-10-CM | POA: Diagnosis not present

## 2019-02-11 DIAGNOSIS — E1142 Type 2 diabetes mellitus with diabetic polyneuropathy: Secondary | ICD-10-CM | POA: Diagnosis not present

## 2019-02-12 DIAGNOSIS — M6281 Muscle weakness (generalized): Secondary | ICD-10-CM | POA: Diagnosis not present

## 2019-02-13 DIAGNOSIS — M6281 Muscle weakness (generalized): Secondary | ICD-10-CM | POA: Diagnosis not present

## 2019-02-13 DIAGNOSIS — R300 Dysuria: Secondary | ICD-10-CM | POA: Diagnosis not present

## 2019-02-14 DIAGNOSIS — M6281 Muscle weakness (generalized): Secondary | ICD-10-CM | POA: Diagnosis not present

## 2019-02-15 DIAGNOSIS — M6281 Muscle weakness (generalized): Secondary | ICD-10-CM | POA: Diagnosis not present

## 2019-02-16 DIAGNOSIS — M6281 Muscle weakness (generalized): Secondary | ICD-10-CM | POA: Diagnosis not present

## 2019-02-16 DIAGNOSIS — L97429 Non-pressure chronic ulcer of left heel and midfoot with unspecified severity: Secondary | ICD-10-CM | POA: Diagnosis not present

## 2019-02-16 DIAGNOSIS — L89153 Pressure ulcer of sacral region, stage 3: Secondary | ICD-10-CM | POA: Diagnosis not present

## 2019-02-16 DIAGNOSIS — Z7401 Bed confinement status: Secondary | ICD-10-CM | POA: Diagnosis not present

## 2019-02-16 DIAGNOSIS — E11621 Type 2 diabetes mellitus with foot ulcer: Secondary | ICD-10-CM | POA: Diagnosis not present

## 2019-02-16 DIAGNOSIS — L97428 Non-pressure chronic ulcer of left heel and midfoot with other specified severity: Secondary | ICD-10-CM | POA: Diagnosis not present

## 2019-02-16 DIAGNOSIS — L89159 Pressure ulcer of sacral region, unspecified stage: Secondary | ICD-10-CM | POA: Diagnosis not present

## 2019-02-16 DIAGNOSIS — L97418 Non-pressure chronic ulcer of right heel and midfoot with other specified severity: Secondary | ICD-10-CM | POA: Diagnosis not present

## 2019-02-17 DIAGNOSIS — M6281 Muscle weakness (generalized): Secondary | ICD-10-CM | POA: Diagnosis not present

## 2019-02-19 DIAGNOSIS — M6281 Muscle weakness (generalized): Secondary | ICD-10-CM | POA: Diagnosis not present

## 2019-02-19 DIAGNOSIS — I10 Essential (primary) hypertension: Secondary | ICD-10-CM | POA: Diagnosis not present

## 2019-02-19 DIAGNOSIS — I5032 Chronic diastolic (congestive) heart failure: Secondary | ICD-10-CM | POA: Diagnosis not present

## 2019-02-20 DIAGNOSIS — R300 Dysuria: Secondary | ICD-10-CM | POA: Diagnosis not present

## 2019-02-20 DIAGNOSIS — M6281 Muscle weakness (generalized): Secondary | ICD-10-CM | POA: Diagnosis not present

## 2019-02-21 DIAGNOSIS — M6281 Muscle weakness (generalized): Secondary | ICD-10-CM | POA: Diagnosis not present

## 2019-02-22 DIAGNOSIS — M6281 Muscle weakness (generalized): Secondary | ICD-10-CM | POA: Diagnosis not present

## 2019-02-23 DIAGNOSIS — M6281 Muscle weakness (generalized): Secondary | ICD-10-CM | POA: Diagnosis not present

## 2019-02-26 DIAGNOSIS — R300 Dysuria: Secondary | ICD-10-CM | POA: Diagnosis not present

## 2019-02-26 DIAGNOSIS — M6281 Muscle weakness (generalized): Secondary | ICD-10-CM | POA: Diagnosis not present

## 2019-03-09 DIAGNOSIS — L89229 Pressure ulcer of left hip, unspecified stage: Secondary | ICD-10-CM | POA: Diagnosis not present

## 2019-03-09 DIAGNOSIS — L89610 Pressure ulcer of right heel, unstageable: Secondary | ICD-10-CM | POA: Diagnosis not present

## 2019-03-09 DIAGNOSIS — L89153 Pressure ulcer of sacral region, stage 3: Secondary | ICD-10-CM | POA: Diagnosis not present

## 2019-03-09 DIAGNOSIS — L97418 Non-pressure chronic ulcer of right heel and midfoot with other specified severity: Secondary | ICD-10-CM | POA: Diagnosis not present

## 2019-03-09 DIAGNOSIS — Z7401 Bed confinement status: Secondary | ICD-10-CM | POA: Diagnosis not present

## 2019-03-09 DIAGNOSIS — L97428 Non-pressure chronic ulcer of left heel and midfoot with other specified severity: Secondary | ICD-10-CM | POA: Diagnosis not present

## 2019-03-09 DIAGNOSIS — E11621 Type 2 diabetes mellitus with foot ulcer: Secondary | ICD-10-CM | POA: Diagnosis not present

## 2019-03-10 DIAGNOSIS — R410 Disorientation, unspecified: Secondary | ICD-10-CM | POA: Diagnosis not present

## 2019-03-10 DIAGNOSIS — R82998 Other abnormal findings in urine: Secondary | ICD-10-CM | POA: Diagnosis not present

## 2019-03-16 DIAGNOSIS — L89229 Pressure ulcer of left hip, unspecified stage: Secondary | ICD-10-CM | POA: Diagnosis not present

## 2019-03-16 DIAGNOSIS — L97512 Non-pressure chronic ulcer of other part of right foot with fat layer exposed: Secondary | ICD-10-CM | POA: Diagnosis not present

## 2019-03-16 DIAGNOSIS — L97418 Non-pressure chronic ulcer of right heel and midfoot with other specified severity: Secondary | ICD-10-CM | POA: Diagnosis not present

## 2019-03-16 DIAGNOSIS — Z7401 Bed confinement status: Secondary | ICD-10-CM | POA: Diagnosis not present

## 2019-03-16 DIAGNOSIS — L89612 Pressure ulcer of right heel, stage 2: Secondary | ICD-10-CM | POA: Diagnosis not present

## 2019-03-16 DIAGNOSIS — E11621 Type 2 diabetes mellitus with foot ulcer: Secondary | ICD-10-CM | POA: Diagnosis not present

## 2019-03-16 DIAGNOSIS — L89153 Pressure ulcer of sacral region, stage 3: Secondary | ICD-10-CM | POA: Diagnosis not present

## 2019-03-16 DIAGNOSIS — L89152 Pressure ulcer of sacral region, stage 2: Secondary | ICD-10-CM | POA: Diagnosis not present

## 2019-03-16 DIAGNOSIS — L89622 Pressure ulcer of left heel, stage 2: Secondary | ICD-10-CM | POA: Diagnosis not present

## 2019-03-16 DIAGNOSIS — L97428 Non-pressure chronic ulcer of left heel and midfoot with other specified severity: Secondary | ICD-10-CM | POA: Diagnosis not present

## 2019-03-20 DIAGNOSIS — Z5181 Encounter for therapeutic drug level monitoring: Secondary | ICD-10-CM | POA: Diagnosis not present

## 2019-03-20 DIAGNOSIS — Z794 Long term (current) use of insulin: Secondary | ICD-10-CM | POA: Diagnosis not present

## 2019-03-26 DIAGNOSIS — Z794 Long term (current) use of insulin: Secondary | ICD-10-CM | POA: Diagnosis not present

## 2019-03-26 DIAGNOSIS — Z5181 Encounter for therapeutic drug level monitoring: Secondary | ICD-10-CM | POA: Diagnosis not present

## 2019-04-02 DIAGNOSIS — Z5181 Encounter for therapeutic drug level monitoring: Secondary | ICD-10-CM | POA: Diagnosis not present

## 2019-04-02 DIAGNOSIS — Z794 Long term (current) use of insulin: Secondary | ICD-10-CM | POA: Diagnosis not present

## 2019-04-03 DIAGNOSIS — L97418 Non-pressure chronic ulcer of right heel and midfoot with other specified severity: Secondary | ICD-10-CM | POA: Diagnosis not present

## 2019-04-03 DIAGNOSIS — E11621 Type 2 diabetes mellitus with foot ulcer: Secondary | ICD-10-CM | POA: Diagnosis not present

## 2019-04-03 DIAGNOSIS — L89619 Pressure ulcer of right heel, unspecified stage: Secondary | ICD-10-CM | POA: Diagnosis not present

## 2019-04-03 DIAGNOSIS — L89229 Pressure ulcer of left hip, unspecified stage: Secondary | ICD-10-CM | POA: Diagnosis not present

## 2019-04-03 DIAGNOSIS — Z7401 Bed confinement status: Secondary | ICD-10-CM | POA: Diagnosis not present

## 2019-04-03 DIAGNOSIS — L89629 Pressure ulcer of left heel, unspecified stage: Secondary | ICD-10-CM | POA: Diagnosis not present

## 2019-04-03 DIAGNOSIS — L89153 Pressure ulcer of sacral region, stage 3: Secondary | ICD-10-CM | POA: Diagnosis not present

## 2019-04-03 DIAGNOSIS — L97428 Non-pressure chronic ulcer of left heel and midfoot with other specified severity: Secondary | ICD-10-CM | POA: Diagnosis not present

## 2019-04-08 DIAGNOSIS — N390 Urinary tract infection, site not specified: Secondary | ICD-10-CM | POA: Diagnosis not present

## 2019-04-08 DIAGNOSIS — N319 Neuromuscular dysfunction of bladder, unspecified: Secondary | ICD-10-CM | POA: Diagnosis not present

## 2019-04-08 DIAGNOSIS — L89153 Pressure ulcer of sacral region, stage 3: Secondary | ICD-10-CM | POA: Diagnosis not present

## 2019-04-08 DIAGNOSIS — R001 Bradycardia, unspecified: Secondary | ICD-10-CM | POA: Diagnosis not present

## 2019-04-08 DIAGNOSIS — R918 Other nonspecific abnormal finding of lung field: Secondary | ICD-10-CM | POA: Diagnosis not present

## 2019-04-08 DIAGNOSIS — K219 Gastro-esophageal reflux disease without esophagitis: Secondary | ICD-10-CM | POA: Diagnosis not present

## 2019-04-08 DIAGNOSIS — R410 Disorientation, unspecified: Secondary | ICD-10-CM | POA: Diagnosis not present

## 2019-04-08 DIAGNOSIS — U071 COVID-19: Secondary | ICD-10-CM | POA: Diagnosis not present

## 2019-04-08 DIAGNOSIS — Z66 Do not resuscitate: Secondary | ICD-10-CM | POA: Diagnosis not present

## 2019-04-08 DIAGNOSIS — I130 Hypertensive heart and chronic kidney disease with heart failure and stage 1 through stage 4 chronic kidney disease, or unspecified chronic kidney disease: Secondary | ICD-10-CM | POA: Diagnosis not present

## 2019-04-08 DIAGNOSIS — R0902 Hypoxemia: Secondary | ICD-10-CM | POA: Diagnosis not present

## 2019-04-08 DIAGNOSIS — E119 Type 2 diabetes mellitus without complications: Secondary | ICD-10-CM | POA: Diagnosis not present

## 2019-04-08 DIAGNOSIS — E1140 Type 2 diabetes mellitus with diabetic neuropathy, unspecified: Secondary | ICD-10-CM | POA: Diagnosis not present

## 2019-04-08 DIAGNOSIS — B9620 Unspecified Escherichia coli [E. coli] as the cause of diseases classified elsewhere: Secondary | ICD-10-CM | POA: Diagnosis not present

## 2019-04-08 DIAGNOSIS — Z515 Encounter for palliative care: Secondary | ICD-10-CM | POA: Diagnosis not present

## 2019-04-08 DIAGNOSIS — Z794 Long term (current) use of insulin: Secondary | ICD-10-CM | POA: Diagnosis not present

## 2019-04-08 DIAGNOSIS — R5381 Other malaise: Secondary | ICD-10-CM | POA: Diagnosis not present

## 2019-04-08 DIAGNOSIS — I959 Hypotension, unspecified: Secondary | ICD-10-CM | POA: Diagnosis not present

## 2019-04-08 DIAGNOSIS — N189 Chronic kidney disease, unspecified: Secondary | ICD-10-CM | POA: Diagnosis not present

## 2019-04-08 DIAGNOSIS — Z87891 Personal history of nicotine dependence: Secondary | ICD-10-CM | POA: Diagnosis not present

## 2019-04-08 DIAGNOSIS — E1122 Type 2 diabetes mellitus with diabetic chronic kidney disease: Secondary | ICD-10-CM | POA: Diagnosis not present

## 2019-04-08 DIAGNOSIS — I5032 Chronic diastolic (congestive) heart failure: Secondary | ICD-10-CM | POA: Diagnosis not present

## 2019-04-08 DIAGNOSIS — Z87440 Personal history of urinary (tract) infections: Secondary | ICD-10-CM | POA: Diagnosis not present

## 2019-04-08 DIAGNOSIS — T83511A Infection and inflammatory reaction due to indwelling urethral catheter, initial encounter: Secondary | ICD-10-CM | POA: Diagnosis not present

## 2019-04-08 DIAGNOSIS — R509 Fever, unspecified: Secondary | ICD-10-CM | POA: Diagnosis not present

## 2019-04-08 DIAGNOSIS — R569 Unspecified convulsions: Secondary | ICD-10-CM | POA: Diagnosis not present

## 2019-04-08 DIAGNOSIS — I498 Other specified cardiac arrhythmias: Secondary | ICD-10-CM | POA: Diagnosis not present

## 2019-05-07 DEATH — deceased
